# Patient Record
Sex: Male | Born: 1970
Health system: Southern US, Community
[De-identification: ages and names within clinical notes are randomized; demographics above are authoritative.]

## PROBLEM LIST (undated history)

## (undated) DIAGNOSIS — K219 Gastro-esophageal reflux disease without esophagitis: Secondary | ICD-10-CM

## (undated) DIAGNOSIS — F329 Major depressive disorder, single episode, unspecified: Secondary | ICD-10-CM

## (undated) DIAGNOSIS — F32A Depression, unspecified: Secondary | ICD-10-CM

## (undated) DIAGNOSIS — IMO0002 Reserved for concepts with insufficient information to code with codable children: Secondary | ICD-10-CM

## (undated) DIAGNOSIS — R002 Palpitations: Secondary | ICD-10-CM

## (undated) DIAGNOSIS — J45909 Unspecified asthma, uncomplicated: Secondary | ICD-10-CM

## (undated) DIAGNOSIS — E291 Testicular hypofunction: Secondary | ICD-10-CM

## (undated) DIAGNOSIS — G43909 Migraine, unspecified, not intractable, without status migrainosus: Secondary | ICD-10-CM

## (undated) DIAGNOSIS — M797 Fibromyalgia: Secondary | ICD-10-CM

## (undated) DIAGNOSIS — I2510 Atherosclerotic heart disease of native coronary artery without angina pectoris: Secondary | ICD-10-CM

## (undated) DIAGNOSIS — G4730 Sleep apnea, unspecified: Secondary | ICD-10-CM

## (undated) DIAGNOSIS — N200 Calculus of kidney: Secondary | ICD-10-CM

## (undated) DIAGNOSIS — Z8739 Personal history of other diseases of the musculoskeletal system and connective tissue: Secondary | ICD-10-CM

## (undated) DIAGNOSIS — M1990 Unspecified osteoarthritis, unspecified site: Secondary | ICD-10-CM

## (undated) DIAGNOSIS — H409 Unspecified glaucoma: Secondary | ICD-10-CM

## (undated) DIAGNOSIS — F419 Anxiety disorder, unspecified: Secondary | ICD-10-CM

## (undated) DIAGNOSIS — R000 Tachycardia, unspecified: Secondary | ICD-10-CM

## (undated) DIAGNOSIS — D869 Sarcoidosis, unspecified: Secondary | ICD-10-CM

## (undated) DIAGNOSIS — I10 Essential (primary) hypertension: Secondary | ICD-10-CM

## (undated) HISTORY — DX: Morbid (severe) obesity due to excess calories: E66.01

## (undated) HISTORY — DX: Unspecified glaucoma: H40.9

## (undated) HISTORY — DX: Palpitations: R00.2

## (undated) HISTORY — DX: Depression, unspecified: F32.A

## (undated) HISTORY — DX: Sarcoidosis, unspecified: D86.9

## (undated) HISTORY — PX: LITHOTRIPSY: SUR834

## (undated) HISTORY — DX: Essential (primary) hypertension: I10

## (undated) HISTORY — DX: Sleep apnea, unspecified: G47.30

## (undated) HISTORY — PX: SPINE SURGERY: SHX786

## (undated) HISTORY — DX: Reserved for concepts with insufficient information to code with codable children: IMO0002

## (undated) HISTORY — DX: Testicular hypofunction: E29.1

## (undated) HISTORY — DX: Anxiety disorder, unspecified: F41.9

## (undated) HISTORY — DX: Tachycardia, unspecified: R00.0

## (undated) HISTORY — DX: Unspecified asthma, uncomplicated: J45.909

## (undated) HISTORY — PX: OTHER SURGICAL HISTORY: SHX169

## (undated) HISTORY — DX: Migraine, unspecified, not intractable, without status migrainosus: G43.909

## (undated) HISTORY — DX: Atherosclerotic heart disease of native coronary artery without angina pectoris: I25.10

---
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
## 2013-05-26 HISTORY — PX: OTHER SURGICAL HISTORY: SHX169

## 2017-05-25 ENCOUNTER — Other Ambulatory Visit: Payer: Self-pay | Admitting: Internal Medicine

## 2017-05-27 ENCOUNTER — Other Ambulatory Visit: Payer: Self-pay | Admitting: Internal Medicine

## 2017-05-27 DIAGNOSIS — R1030 Lower abdominal pain, unspecified: Secondary | ICD-10-CM

## 2017-06-01 ENCOUNTER — Other Ambulatory Visit: Payer: Self-pay

## 2017-06-02 ENCOUNTER — Ambulatory Visit
Admission: RE | Admit: 2017-06-02 | Discharge: 2017-06-02 | Disposition: A | Discharge status: Home / Self Care | Source: Ambulatory Visit | Attending: Internal Medicine | Admitting: Internal Medicine

## 2017-06-02 DIAGNOSIS — R1030 Lower abdominal pain, unspecified: Secondary | ICD-10-CM

## 2018-12-17 ENCOUNTER — Other Ambulatory Visit: Payer: Self-pay

## 2018-12-21 ENCOUNTER — Ambulatory Visit: Admitting: Endocrinology

## 2019-01-17 ENCOUNTER — Ambulatory Visit: Admitting: Endocrinology

## 2019-05-04 ENCOUNTER — Telehealth: Payer: Self-pay | Admitting: Cardiology

## 2019-05-04 ENCOUNTER — Encounter: Payer: Self-pay | Admitting: Cardiology

## 2019-05-04 ENCOUNTER — Ambulatory Visit (INDEPENDENT_AMBULATORY_CARE_PROVIDER_SITE_OTHER): Admitting: Cardiology

## 2019-05-04 ENCOUNTER — Encounter (INDEPENDENT_AMBULATORY_CARE_PROVIDER_SITE_OTHER): Payer: Self-pay

## 2019-05-04 ENCOUNTER — Ambulatory Visit

## 2019-05-04 VITALS — BP 180/114 | HR 107 | Temp 97.7°F | Ht 72.0 in | Wt 256.0 lb

## 2019-05-04 DIAGNOSIS — R002 Palpitations: Secondary | ICD-10-CM

## 2019-05-04 DIAGNOSIS — I493 Ventricular premature depolarization: Secondary | ICD-10-CM | POA: Diagnosis not present

## 2019-05-04 NOTE — Progress Notes (Signed)
Clinical Summary Mr. Zheng is a 48 y.o.male seen as new patient.   1. 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 Symptoms occursing daily. Lasts a few seconds, tends to occur with exertion - +SOB, no dizziness.   - coffee rarely, rare tea. Pepsi and mountain approx 2 liters. No energy drinks. Rare energy drinks. Rare EtoH.  Takes goodies 4-5 times per day. Takes adderall bid.  - on proprnolol for several years.  - can have random diaphoretic episodes  2. Sarcoid - involvement of eye, lungs, liver, kidney, lymph nodes, and neuro - followed by rheumatology   3. HTN - home 130s/80s  - previously seen by cardiology in 2011, evaluated by [facility] is [employer] Past Medical History:  Diagnosis Date  . Anxiety   . Depression   . Migraine   . Morbid obesity ([place])   . Palpitations   . Sarcoidosis   . Tachycardia   . Testicular hypofunction      No Known Allergies   Current Outpatient Medications  Medication Sig Dispense Refill  . amphetamine-dextroamphetamine (ADDERALL) 15 MG tablet Take 15 mg by mouth 2 (two) times daily.    Marland Kitchen atorvastatin (LIPITOR) 10 MG tablet Take 10 mg by mouth daily.    . cyclobenzaprine (FLEXERIL) 10 MG tablet Take 10 mg by mouth 3 (three) times daily.    . propranolol (INDERAL) 80 MG tablet Take 80 mg by mouth daily.    . sertraline (ZOLOFT) 100 MG tablet Take 100 mg by mouth daily.    . temazepam (RESTORIL) 30 MG capsule Take 30 mg by mouth at bedtime as needed for sleep.    . Testosterone Cypionate 200 MG/ML SOLN Inject 1 mL as directed every 14 (fourteen) days.    . Venlafaxine HCl 225 MG TB24 Take 1 tablet by mouth daily.     No current facility-administered medications for this visit.         No Known Allergies    History reviewed. No pertinent family history.   Social History Social History    Socioeconomic History  . Marital status: Married    Spouse name: Not on file  . Number of children: Not on file  . Years of education: Not on file  . Highest education level: Not on file  Occupational History  . Not on file  Tobacco Use  . Smoking status: Current Every Day Smoker  . Smokeless tobacco: Current User    Types: Snuff  Substance and Sexual Activity  . Alcohol use: Yes    Comment: rare   . Drug use: Yes    Types: Marijuana  . Sexual activity: Not on file  Other Topics Concern  . Not on file  Social History Narrative  . Not on file   Social Determinants of Health   Financial Resource Strain:   . Difficulty of Paying Living Expenses: Not on file  Food Insecurity:   . Worried About Charity fundraiser in the Last Year: Not on file  . Ran Out of Food in the Last Year: Not on file  Transportation Needs:   . Lack of Transportation (Medical): Not on file  . Lack of Transportation (Non-Medical): Not on file  Physical Activity:   . Days of Exercise per Week: Not on file  . Minutes of Exercise per Session: Not on file  Stress:   .  Feeling of Stress : Not on file  Social Connections:   . Frequency of Communication with Friends and Family: Not on file  . Frequency of Social Gatherings with Friends and Family: Not on file  . Attends Religious Services: Not on file  . Active Member of Clubs or Organizations: Not on file  . Attends Archivist Meetings: Not on file  . Marital Status: Not on file  Intimate Partner Violence:   . Fear of Current or Ex-Partner: Not on file  . Emotionally Abused: Not on file  . Physically Abused: Not on file  . Sexually Abused: Not on file     Review of Systems CONSTITUTIONAL: No weight loss, fever, chills, weakness or fatigue.  HEENT: Eyes: No visual loss, blurred vision, double vision or yellow sclerae.No hearing loss, sneezing, congestion, runny nose or sore throat.  SKIN: No rash or itching.  CARDIOVASCULAR: per hpi  RESPIRATORY: No shortness of breath, cough or sputum.  GASTROINTESTINAL: No anorexia, nausea, vomiting or diarrhea. No abdominal pain or blood.  GENITOURINARY: No burning on urination, no polyuria NEUROLOGICAL: No headache, dizziness, syncope, paralysis, ataxia, numbness or tingling in the extremities. No change in bowel or bladder control.  MUSCULOSKELETAL: No muscle, back pain, joint pain or stiffness.  LYMPHATICS: No enlarged nodes. No history of splenectomy.  PSYCHIATRIC: No history of depression or anxiety.  ENDOCRINOLOGIC: No reports of sweating, cold or heat intolerance. No polyuria or polydipsia.  Marland Kitchen   Physical Examination Today's Vitals   05/04/19 0833  BP: (!) 180/114  Pulse: (!) 107  Temp: 97.7 F (36.5 C)  TempSrc: Skin  Weight: 256 lb (116.1 kg)  Height: 6' (1.829 m)   Body mass index is 34.72 kg/m.  Gen: resting comfortably, no acute distress HEENT: no scleral icterus, pupils equal round and reactive, no palptable cervical adenopathy,  CV: RRR, no m/r/g, no jvd Resp: Clear to auscultation bilaterally GI: abdomen is soft, non-tender, non-distended, normal bowel sounds, no hepatosplenomegaly MSK: extremities are warm, no edema.  Skin: warm, no rash Neuro:  no focal deficits Psych: appropriate affect     Assessment and Plan  1. PVCs - we will arrange a home monitor to measure PVCs burden and look for any nonsustained or sustained ventricular arrhythmias.   2. Sarcoid - followed by rheum - obtain prior records as far as cardiac workup.    Bp elevated in clinic today but home numbers are fine, conitnue to monitor.      Arnoldo Lenis,

## 2019-05-04 NOTE — Telephone Encounter (Signed)

## 2019-05-04 NOTE — Patient Instructions (Signed)
Medication Instructions:  Your physician recommends that you continue on your current medications as directed. Please refer to the Current Medication list given to you today.   Labwork: none  Testing/Procedures: Your physician has recommended that you wear a holter monitor. Holter monitors are medical devices that record the heart's electrical activity. Doctors most often use these monitors to diagnose arrhythmias. Arrhythmias are problems with the speed or rhythm of the heartbeat. The monitor is a small, portable device. You can wear one while you do your normal daily activities. This is usually used to diagnose what is causing palpitations/syncope (passing out).  72 hour   Follow-Up: Your physician recommends that you schedule a follow-up appointment in: 6 weeks    Any Other Special Instructions Will Be Listed Below (If Applicable).     If you need a refill on your cardiac medications before your next appointment, please call your pharmacy.

## 2019-05-16 ENCOUNTER — Telehealth: Payer: Self-pay | Admitting: Cardiology

## 2019-05-16 NOTE — Telephone Encounter (Signed)
Please call Neoma Laming w/ [facility] for short scan notification  [phone]

## 2019-05-17 NOTE — Telephone Encounter (Signed)
Called,  no answer.   Left message

## 2019-05-30 ENCOUNTER — Other Ambulatory Visit: Payer: Self-pay

## 2019-06-15 ENCOUNTER — Telehealth (INDEPENDENT_AMBULATORY_CARE_PROVIDER_SITE_OTHER): Admitting: Cardiology

## 2019-06-15 ENCOUNTER — Encounter: Payer: Self-pay | Admitting: Cardiology

## 2019-06-15 VITALS — Ht 72.0 in | Wt 242.0 lb

## 2019-06-15 DIAGNOSIS — R002 Palpitations: Secondary | ICD-10-CM

## 2019-06-15 DIAGNOSIS — I493 Ventricular premature depolarization: Secondary | ICD-10-CM | POA: Diagnosis not present

## 2019-06-15 NOTE — Progress Notes (Signed)
Virtual Visit via Telephone Note   This visit type was conducted due to national recommendations for restrictions regarding the COVID-19 Pandemic (e.g. social distancing) in an effort to limit this patient's exposure and mitigate transmission in our community.  Due to his co-morbid illnesses, this patient is at least at moderate risk for complications without adequate follow up.  This format is felt to be most appropriate for this patient at this time.  The patient did not have access to video technology/had technical difficulties with video requiring transitioning to audio format only (telephone).  All issues noted in this document were discussed and addressed.  No physical exam could be performed with this format.  Please refer to the patient's chart for his  consent to telehealth for [facility].   Date:  06/15/2019   ID:  Shaina Beaudet, DOB Sep 29, 1970, MRN [id number]  Patient Location: Home Provider Location: Home  PCP:  Celene Squibb, MD  Cardiologist:  Dr Carlyle Dolly MD Electrophysiologist:  None   Evaluation Performed:  Follow-Up Visit  Chief Complaint:  Follow up  History of Present Illness:    Henry Webb is a 49 y.o. male seen today for follow up of the following medical problems .  1. 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 Symptoms occursing daily. Lasts a few seconds, tends to occur with exertion - +SOB, no dizziness.   - coffee rarely, rare tea. Pepsi and mountain approx 2 liters. Rare energy drinks. Rare EtoH.  Takes goodies 4-5 times per day. Takes adderall bid.  - on proprnolol for several years.  - can have random diaphoretic episodes    04/2019 episodes 1% PVC burden. 02/2019 [facility] PET scan: no evidence of cardiac sarcoid - symptoms improving. Exercising regularly without troubles. Has cut back on caffeine. Quit taking adderall.     2. Sarcoid - involvement  of eye, lungs, liver, kidney, lymph nodes, and neuro - followed by rheumatology   3. HTN - home 130s/80s  - previously seen by cardiology in 2011, evaluated by [facility]   The patient does not have symptoms concerning for COVID-19 infection (fever, chills, cough, or new shortness of breath).    Past Medical History:  Diagnosis Date  . Anxiety   . Depression   . Migraine   . Morbid obesity ([place])   . Palpitations   . Sarcoidosis   . Tachycardia   . Testicular hypofunction    No past surgical history on file.   No outpatient medications have been marked as taking for the 06/15/19 encounter (Appointment) with Arnoldo Lenis, MD.     Allergies:   Patient has no known allergies.   Social History   Tobacco Use  . Smoking status: Current Every Day Smoker  . Smokeless tobacco: Current User    Types: Snuff  Substance Use Topics  . Alcohol use: Yes    Comment: rare   . Drug use: Yes    Types: Marijuana     Family Hx: The patient's family history is not on file.  ROS:   Please see the history of present illness.     All other systems reviewed and are negative.   Prior CV studies:   The following studies were reviewed today:   04/2019 event monitor  72 hour monitor  Min HR 76, Max HR 147, Avg HR 105  Rare supraventricular ectopy in the form of isolated  PACs  Occasional ventricular ectopy. 1% PVC burden. In the form of isolated PVCs, couplets, bigeminy, trigeminy. No NSVT or VT.   02/2019 Cardiac PET [facility] Impressions: - No evidence of cardiac sarcoidosis - Perfusion: Normal perfusion in the left ventricular myocardium on resting images. Metabolism: No abnormal FDG uptake noted.  - Global systolic function is mildly reduced. - Coronary calcifications are noted  Labs/Other Tests and Data Reviewed:    EKG:  No ECG reviewed.  Recent Labs: No results found for requested labs within last 8760 hours.   Recent Lipid Panel No  results found for: CHOL, TRIG, HDL, CHOLHDL, LDLCALC, LDLDIRECT  Wt Readings from Last 3 Encounters:  05/04/19 256 lb (116.1 kg)     Objective:    Vital Signs:   Today's Vitals   06/15/19 0816  Weight: 242 lb (109.8 kg)  Height: 6' (1.829 m)   Body mass index is 32.82 kg/m. Norma affect. Normal speech pattern and tone. COmfortable, no apparent distress. No audible signs of SOB or wheezing .  ASSESSMENT & PLAN:    1. PVCs - low burden on recent monitor, we are calling [facility] as the prelim report generaited mentions some NSVT but no included strips show this - symptoms improved with caffeine weaning - continue propanolol that he has been on, if recurrent symptoms can titrate or change to lopressor  2. Sarcoid - followed by rheum - recent cardiac PET at [facility] showed no cardiac involvement.      COVID-19 Education: The signs and symptoms of COVID-19 were discussed with the patient and how to seek care for testing (follow up with PCP or arrange E-visit).  The importance of social distancing was discussed today.  Time:   Today, I have spent 12 minutes with the patient with telehealth technology discussing the above problems.     Medication Adjustments/Labs and Tests Ordered: Current medicines are reviewed at length with the patient today.  Concerns regarding medicines are outlined above.   Tests Ordered: No orders of the defined types were placed in this encounter.   Medication Changes: No orders of the defined types were placed in this encounter.   Follow Up:  Either In Person or Virtual in 6 month(s)  Signed, Carlyle Dolly, MD  06/15/2019 7:54 AM    [place]

## 2019-06-15 NOTE — Patient Instructions (Signed)

## 2019-12-26 ENCOUNTER — Ambulatory Visit: Admitting: Cardiology

## 2019-12-26 NOTE — Progress Notes (Deleted)
Clinical Summary Henry Webb is a 49 y.o.male seen today for follow up of the following medical problems .  1. 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 Symptoms occursing daily. Lasts a few seconds, tends to occur with exertion - +SOB, no dizziness.   - coffee rarely, rare tea. Pepsi and mountain approx 2 liters. Rare energy drinks. Rare EtoH.  Takes goodies 4-5 times per day. Takes adderall bid.  - on proprnolol for several years.  - can have random diaphoretic episodes    04/2019 episodes 1% PVC burden. 02/2019 [facility] PET scan: no evidence of cardiac sarcoid - symptoms improving. Exercising regularly without troubles. Has cut back on caffeine. Quit taking adderall.     2. Sarcoid - involvement of eye, lungs, liver, kidney, lymph nodes, and neuro - followed by rheumatology   3. HTN - home 130s/80s  - previously seen by cardiology in 2011, evaluated by [facility]     Past Medical History:  Diagnosis Date  . Anxiety   . Depression   . Migraine   . Morbid obesity ([place])   . Palpitations   . Sarcoidosis   . Tachycardia   . Testicular hypofunction      No Known Allergies   Current Outpatient Medications  Medication Sig Dispense Refill  . atorvastatin (LIPITOR) 40 MG tablet TK 1 T PO NIGHTLY FOR CHOLESTEROL    . cyclobenzaprine (FLEXERIL) 10 MG tablet Take 10 mg by mouth 3 (three) times daily.    . propranolol (INDERAL) 80 MG tablet Take 80 mg by mouth daily.    . sertraline (ZOLOFT) 100 MG tablet Take 100 mg by mouth daily.    . temazepam (RESTORIL) 30 MG capsule Take 30 mg by mouth at bedtime as needed for sleep.    . Testosterone Cypionate 200 MG/ML SOLN Inject 1 mL as directed every 14 (fourteen) days.    . Venlafaxine HCl 225 MG TB24 Take 1 tablet by mouth daily.     No current facility-administered medications for this visit.     No  past surgical history on file.   No Known Allergies    No family history on file.   Social History Henry Webb reports that he has quit smoking. His smokeless tobacco use includes snuff. Henry Webb reports current alcohol use.   Review of Systems CONSTITUTIONAL: No weight loss, fever, chills, weakness or fatigue.  HEENT: Eyes: No visual loss, blurred vision, double vision or yellow sclerae.No hearing loss, sneezing, congestion, runny nose or sore throat.  SKIN: No rash or itching.  CARDIOVASCULAR:  RESPIRATORY: No shortness of breath, cough or sputum.  GASTROINTESTINAL: No anorexia, nausea, vomiting or diarrhea. No abdominal pain or blood.  GENITOURINARY: No burning on urination, no polyuria NEUROLOGICAL: No headache, dizziness, syncope, paralysis, ataxia, numbness or tingling in the extremities. No change in bowel or bladder control.  MUSCULOSKELETAL: No muscle, back pain, joint pain or stiffness.  LYMPHATICS: No enlarged nodes. No history of splenectomy.  PSYCHIATRIC: No history of depression or anxiety.  ENDOCRINOLOGIC: No reports of sweating, cold or heat intolerance. No polyuria or polydipsia.  Marland Kitchen   Physical Examination There were no vitals filed for this visit. There were no vitals filed for this visit.  Gen: resting comfortably, no acute distress HEENT: no scleral icterus, pupils equal round and reactive, no palptable cervical adenopathy,  CV Resp: Clear to auscultation bilaterally GI: abdomen is  soft, non-tender, non-distended, normal bowel sounds, no hepatosplenomegaly MSK: extremities are warm, no edema.  Skin: warm, no rash Neuro:  no focal deficits Psych: appropriate affect   Diagnostic Studies   04/2019 event monitor  72 hour monitor  Min HR 76, Max HR 147, Avg HR 105  Rare supraventricular ectopy in the form of isolated PACs  Occasional ventricular ectopy. 1% PVC burden. In the form of isolated PVCs, couplets, bigeminy, trigeminy. No NSVT or  VT.   02/2019 Cardiac PET [facility] Impressions: - No evidence of cardiac sarcoidosis - Perfusion: Normal perfusion in the left ventricular myocardium on resting images. Metabolism: No abnormal FDG uptake noted.  - Global systolic function is mildly reduced. - Coronary calcifications are noted   Assessment and Plan  1. PVCs - low burden on recent monitor, we are calling [facility] as the prelim report generaited mentions some NSVT but no included strips show this - symptoms improved with caffeine weaning - continue propanolol that he has been on, if recurrent symptoms can titrate or change to lopressor  2. Sarcoid - followed by rheum - recent cardiac PET at [facility] showed no cardiac involvement.       Arnoldo Lenis, M.D., F.A.C.C.

## 2020-05-11 ENCOUNTER — Encounter: Payer: Self-pay | Admitting: Cardiology

## 2020-05-11 ENCOUNTER — Other Ambulatory Visit: Payer: Self-pay

## 2020-05-11 ENCOUNTER — Ambulatory Visit: Admitting: Cardiology

## 2020-05-11 VITALS — BP 152/100 | HR 121 | Ht 72.0 in | Wt 252.0 lb

## 2020-05-11 DIAGNOSIS — I493 Ventricular premature depolarization: Secondary | ICD-10-CM

## 2020-05-11 DIAGNOSIS — R002 Palpitations: Secondary | ICD-10-CM | POA: Diagnosis not present

## 2020-05-11 DIAGNOSIS — R0789 Other chest pain: Secondary | ICD-10-CM | POA: Diagnosis not present

## 2020-05-11 DIAGNOSIS — R079 Chest pain, unspecified: Secondary | ICD-10-CM | POA: Diagnosis not present

## 2020-05-11 NOTE — Progress Notes (Signed)
Clinical Summary Henry Webb is a 49 y.o.male seen today for follow up of the following medical problems .  1. Palpitations - symptoms on and off for several years - 04/2019 episodes 1% PVC burden. 02/2019 [facility] PET scan: no evidence of cardiac sarcoid  - palpitations at times though overall stable - of not reports high bc powder intake.    2. Chest pain - sharp pain midchest, come activity. Mild pain, resolves with rest. No other associated symptoms - symptoms started 3-4 months ago. No change in pattern.  CAD: HL, former 8 years tobacco, father CAD in 17s, paternal grandather heart diease later 61s,   - 02/2019 PET stress at [facility], some coronary calcifications, no ischemia. Of note was not having chest pains at that time, test ordered to evaluate for cardiac sarcoid which was negative.   3. Sarcoid - involvement of eye, lungs, liver, kidney, lymph nodes, and neuro - followed by rheumatology   4. White coat HTN - home 130s/80s   5. Chronic headache - heavy bc powder use, we discussed the concerns of high ASA and caffeine intake associated with bc powder  Has 2 grandkids, one on the way.   Past Medical History:  Diagnosis Date  . Anxiety   . Depression   . Migraine   . Morbid obesity ([place])   . Palpitations   . Sarcoidosis   . Tachycardia   . Testicular hypofunction      No Known Allergies   Current Outpatient Medications  Medication Sig Dispense Refill  . atorvastatin (LIPITOR) 40 MG tablet TK 1 T PO NIGHTLY FOR CHOLESTEROL    . cyclobenzaprine (FLEXERIL) 10 MG tablet Take 10 mg by mouth 3 (three) times daily.    . propranolol (INDERAL) 80 MG tablet Take 80 mg by mouth daily.    . sertraline (ZOLOFT) 100 MG tablet Take 100 mg by mouth daily.    . temazepam (RESTORIL) 30 MG capsule Take 30 mg by mouth at bedtime as needed for sleep.    . Testosterone Cypionate 200 MG/ML SOLN Inject 1 mL as directed every 14 (fourteen) days.    . Venlafaxine HCl 225 MG  TB24 Take 1 tablet by mouth daily.     No current facility-administered medications for this visit.     No past surgical history on file.   No Known Allergies    No family history on file.   Social History Henry Webb reports that he has quit smoking. His smokeless tobacco use includes snuff. Henry Webb reports current alcohol use.   Review of Systems CONSTITUTIONAL: No weight loss, fever, chills, weakness or fatigue.  HEENT: Eyes: No visual loss, blurred vision, double vision or yellow sclerae.No hearing loss, sneezing, congestion, runny nose or sore throat.  SKIN: No rash or itching.  CARDIOVASCULAR: per hpi RESPIRATORY: No shortness of breath, cough or sputum.  GASTROINTESTINAL: No anorexia, nausea, vomiting or diarrhea. No abdominal pain or blood.  GENITOURINARY: No burning on urination, no polyuria NEUROLOGICAL: No headache, dizziness, syncope, paralysis, ataxia, numbness or tingling in the extremities. No change in bowel or bladder control.  MUSCULOSKELETAL: No muscle, back pain, joint pain or stiffness.  LYMPHATICS: No enlarged nodes. No history of splenectomy.  PSYCHIATRIC: No history of depression or anxiety.  ENDOCRINOLOGIC: No reports of sweating, cold or heat intolerance. No polyuria or polydipsia.  Marland Kitchen   Physical Examination Today's Vitals   05/11/20 1346  BP: (!) 152/100  Pulse: (!) 121  SpO2: 96%  Weight:  252 lb (114.3 kg)  Height: 6' (1.829 m)   Body mass index is 34.18 kg/m.  Gen: resting comfortably, no acute distress HEENT: no scleral icterus, pupils equal round and reactive, no palptable cervical adenopathy,  CV: RRR, no m/r/g, no jvd Resp: Clear to auscultation bilaterally GI: abdomen is soft, non-tender, non-distended, normal bowel sounds, no hepatosplenomegaly MSK: extremities are warm, no edema.  Skin: warm, no rash Neuro:  no focal deficits Psych: appropriate affect   Diagnostic Studies 04/2019 event monitor  72 hour monitor  Min HR  76, Max HR 147, Avg HR 105  Rare supraventricular ectopy in the form of isolated PACs  Occasional ventricular ectopy. 1% PVC burden. In the form of isolated PVCs, couplets, bigeminy, trigeminy. No NSVT or VT.   02/2019 Cardiac PET [facility] Impressions: - No evidence of cardiac sarcoidosis - Perfusion: Normal perfusion in the left ventricular myocardium on resting images. Metabolism: No abnormal FDG uptake noted.  - Global systolic function is mildly reduced. - Coronary calcifications are noted    Assessment and Plan  1. PVCs - low burden on recent monitor, -symptoms overlal controlled on propranolol which he has been on for some time -discussed weaning caffeine intake with bc powders  2. Chest pain - exertional chest pains - had negative stress PET last year at [facility], however this was done to look for sarcoid and he was not symptomatic at the time - with new exertional chest pain symptoms will plan for GXT - EKG today shows sinus tach, no acute ischemic changes    Arnoldo Lenis, M.D.

## 2020-05-11 NOTE — Patient Instructions (Addendum)
Medication Instructions:  No Changes today *If you need a refill on your cardiac medications before your next appointment, please call your pharmacy*   Lab Work: None Today If you have labs (blood work) drawn today and your tests are completely normal, you will receive your results only by: Marland Kitchen MyChart Message (if you have MyChart) OR . A paper copy in the mail If you have any lab test that is abnormal or we need to change your treatment, we will call you to review the results.   Testing/Procedures: GXT    Follow-Up: At [facility], you and your health needs are our priority.  As part of our continuing mission to provide you with exceptional heart care, we have created designated Provider Care Teams.  These Care Teams include your primary Cardiologist (physician) and Advanced Practice Providers (APPs -  Physician Assistants and Nurse Practitioners) who all work together to provide you with the care you need, when you need it.  We recommend signing up for the patient portal called "MyChart".  Sign up information is provided on this After Visit Summary.  MyChart is used to connect with patients for Virtual Visits (Telemedicine).  Patients are able to view lab/test results, encounter notes, upcoming appointments, etc.  Non-urgent messages can be sent to your provider as well.   To learn more about what you can do with MyChart, go to [email or web address].    Your next appointment:   6 month(s)  The format for your next appointment:   In Person  Provider:   Carlyle Dolly, MD   Other Instructions The day of your Exercise Stress Electrocardiography, please do NOT take Propanolol 80 mg tablet.

## 2020-05-14 ENCOUNTER — Other Ambulatory Visit (HOSPITAL_COMMUNITY)
Admission: RE | Admit: 2020-05-14 | Discharge: 2020-05-14 | Disposition: A | Discharge status: Home / Self Care | Source: Ambulatory Visit | Attending: Cardiology | Admitting: Cardiology

## 2020-05-14 ENCOUNTER — Other Ambulatory Visit: Payer: Self-pay

## 2020-05-14 DIAGNOSIS — Z20822 Contact with and (suspected) exposure to covid-19: Secondary | ICD-10-CM | POA: Diagnosis not present

## 2020-05-14 DIAGNOSIS — Z01812 Encounter for preprocedural laboratory examination: Secondary | ICD-10-CM | POA: Insufficient documentation

## 2020-05-15 LAB — SARS CORONAVIRUS 2 (TAT 6-24 HRS): SARS Coronavirus 2: NEGATIVE

## 2020-05-16 ENCOUNTER — Ambulatory Visit (HOSPITAL_COMMUNITY)
Admission: RE | Admit: 2020-05-16 | Discharge: 2020-05-16 | Disposition: A | Discharge status: Home / Self Care | Source: Ambulatory Visit | Attending: Cardiology | Admitting: Cardiology

## 2020-05-16 ENCOUNTER — Other Ambulatory Visit: Payer: Self-pay

## 2020-05-16 DIAGNOSIS — R079 Chest pain, unspecified: Secondary | ICD-10-CM | POA: Diagnosis not present

## 2020-05-16 LAB — EXERCISE TOLERANCE TEST
Estimated workload: 9.1 METS
Exercise duration (min): 6 min
Exercise duration (sec): 42 s
MPHR: 171 {beats}/min
Peak HR: 150 {beats}/min
Percent HR: 87 %
RPE: 17
Rest HR: 100 {beats}/min

## 2020-05-31 ENCOUNTER — Encounter: Payer: Self-pay | Admitting: Internal Medicine

## 2020-06-01 ENCOUNTER — Telehealth: Payer: Self-pay

## 2020-06-01 DIAGNOSIS — R079 Chest pain, unspecified: Secondary | ICD-10-CM

## 2020-06-01 NOTE — Telephone Encounter (Signed)
-----   Message from Arnoldo Lenis, MD sent at 05/21/2020 10:34 AM EST ----- Mixed findings on recent stress test, needs more detailed testing for further evaluation. Can we order a lexiscan for chest pain  Zandra Abts MD

## 2020-06-01 NOTE — Telephone Encounter (Signed)
I spoke with patient, he agrees to have [place].I gave him instructions for test.

## 2020-06-06 ENCOUNTER — Other Ambulatory Visit: Payer: Self-pay

## 2020-06-06 ENCOUNTER — Ambulatory Visit (HOSPITAL_COMMUNITY)
Admission: RE | Admit: 2020-06-06 | Discharge: 2020-06-06 | Disposition: A | Discharge status: Home / Self Care | Source: Ambulatory Visit | Attending: Cardiology | Admitting: Cardiology

## 2020-06-06 ENCOUNTER — Encounter (HOSPITAL_COMMUNITY)
Admission: RE | Admit: 2020-06-06 | Discharge: 2020-06-06 | Disposition: A | Discharge status: Home / Self Care | Source: Ambulatory Visit | Attending: Cardiology | Admitting: Cardiology

## 2020-06-06 DIAGNOSIS — R079 Chest pain, unspecified: Secondary | ICD-10-CM | POA: Insufficient documentation

## 2020-06-06 LAB — NM MYOCAR MULTI W/SPECT W/WALL MOTION / EF
LV dias vol: 121 mL (ref 62–150)
LV sys vol: 53 mL
Peak HR: 121 {beats}/min
RATE: 0.47
Rest HR: 78 {beats}/min
SDS: 4
SRS: 0
SSS: 4
TID: 1.26

## 2020-06-06 MED ORDER — REGADENOSON 0.4 MG/5ML IV SOLN
INTRAVENOUS | Status: AC
  Administered 2020-06-06: 0.4 mg via INTRAVENOUS
  Filled 2020-06-06: qty 5

## 2020-06-06 MED ORDER — TECHNETIUM TC 99M TETROFOSMIN IV KIT
30.0000 | PACK | Freq: Once | INTRAVENOUS | Status: AC | PRN
  Administered 2020-06-06: 32.6 via INTRAVENOUS

## 2020-06-06 MED ORDER — TECHNETIUM TC 99M TETROFOSMIN IV KIT
10.0000 | PACK | Freq: Once | INTRAVENOUS | Status: AC | PRN
  Administered 2020-06-06: 10.9 via INTRAVENOUS

## 2020-06-06 MED ORDER — SODIUM CHLORIDE FLUSH 0.9 % IV SOLN
INTRAVENOUS | Status: AC
  Administered 2020-06-06: 10 mL via INTRAVENOUS
  Filled 2020-06-06: qty 10

## 2020-06-12 ENCOUNTER — Telehealth: Payer: Self-pay | Admitting: *Deleted

## 2020-06-12 NOTE — Telephone Encounter (Signed)
Pt voiced understanding and c/o chest pain when he exerts him self and says its not unbearable but noticeable, he denies any SOB - pcp called him with lab results and total cholesterol is 262 and pcp restarted atorvastatin 40 mg daily

## 2020-06-12 NOTE — Telephone Encounter (Signed)
-----   Message from Arnoldo Lenis, MD sent at 06/11/2020 12:50 PM EST ----- Stress test dose suggest there may be a moderate area of blocakge on the bottom of the heart. Depending on his symptoms would dictate what we would need to do next. Can he update [place] on any ongoing chest pain or SOB symptoms   J BrancH MD

## 2020-06-13 MED ORDER — ISOSORBIDE MONONITRATE ER 30 MG PO TB24: 30.0000 mg | ORAL_TABLET | Freq: Every day | ORAL | 3 refills | Status: DC

## 2020-06-13 NOTE — Telephone Encounter (Signed)
Patient informed and verbalized understanding of plan. 

## 2020-06-13 NOTE — Telephone Encounter (Signed)
Can we start him on imdur 30mg  daily and have him see PA in 2-3 weeks  Zandra Abts MD

## 2020-06-27 ENCOUNTER — Encounter: Payer: Self-pay | Admitting: Physician Assistant

## 2020-06-27 ENCOUNTER — Other Ambulatory Visit: Payer: Self-pay

## 2020-06-27 ENCOUNTER — Ambulatory Visit (INDEPENDENT_AMBULATORY_CARE_PROVIDER_SITE_OTHER): Admitting: Physician Assistant

## 2020-06-27 VITALS — BP 144/100 | HR 116 | Ht 72.0 in | Wt 242.6 lb

## 2020-06-27 DIAGNOSIS — I25119 Atherosclerotic heart disease of native coronary artery with unspecified angina pectoris: Secondary | ICD-10-CM

## 2020-06-27 DIAGNOSIS — E782 Mixed hyperlipidemia: Secondary | ICD-10-CM

## 2020-06-27 DIAGNOSIS — I10 Essential (primary) hypertension: Secondary | ICD-10-CM

## 2020-06-27 DIAGNOSIS — I493 Ventricular premature depolarization: Secondary | ICD-10-CM | POA: Diagnosis not present

## 2020-06-27 MED ORDER — ASPIRIN EC 81 MG PO TBEC: 81.0000 mg | DELAYED_RELEASE_TABLET | Freq: Every day | ORAL | 3 refills | Status: AC

## 2020-06-27 MED ORDER — NITROGLYCERIN 0.4 MG SL SUBL: 0.4000 mg | SUBLINGUAL_TABLET | SUBLINGUAL | 3 refills | Status: DC | PRN

## 2020-06-27 MED ORDER — PROPRANOLOL HCL ER 160 MG PO CP24: 160.0000 mg | ORAL_CAPSULE | Freq: Every day | ORAL | 5 refills | Status: DC

## 2020-06-27 NOTE — Progress Notes (Signed)
Cardiology Office Note:    Date:  06/27/2020   ID:  [name], DOB [birth year], MRN [id number]  PCP:  Celene Squibb, MD  [facility] Cardiologist:  Carlyle Dolly, MD   [facility] Electrophysiologist:  None   Referring MD: Celene Squibb, MD   Chief Complaint:  Follow-up (CAD with angina)    Patient Profile:    Henry Webb is a 49 y.o. male with:   Palpitations  04/2019 monitor - 1% PVCs  Rx: Propranolol  Coronary artery disease   Stress PET at [facility] 02/2019: Chronic calcifications, no ischemia  Myoview 1/22: inf-lat infarct w peri-infarct ischemia, normal EF; intermediate risk  Sarcoidosis  PET at [facility] 02/2019: no cardiac sarcoid  Hypertension ("whitecoat")  Migraine headaches  Morbid obesity  Prior CV studies: Myoview 06/06/2020   There was no ST segment deviation noted during stress.  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  This is an intermediate risk study.  The left ventricular ejection fraction is normal (55-65%).  GXT 05/16/2020  No diagnostic ST segment changes to indicate ischemia. Patient achieved 85% MPHR. Hypertensive response to exercise. Intermittent chest pain reported. No significant arrhythmias. Technically the Duke treadmill score is 2.5 and intermediate risk due to presence of non-limiting chest pain, although there were no diagnostic ischemic ST changes.  Blood pressure demonstrated a hypertensive response to exercise.  Event monitor 05/30/2019  72 hour monitor  Min HR 76, Max HR 147, Avg HR 105  Rare supraventricular ectopy in the form of isolated PACs  Occasional ventricular ectopy. 1% PVC burden. In the form of isolated PVCs, couplets, bigeminy, trigeminy. No NSVT or VT.  INCOMPLETE  Cardiac PET 02/2019 [facility]) - No evidence of cardiac sarcoidosis - Perfusion: Normal perfusion in the left ventricular myocardium on resting images. Metabolism: No  abnormal FDG uptake noted.  - Global systolic function is mildly reduced. - Coronary calcifications are noted  History of Present Illness:    Henry Webb was last seen by Dr. Harl Bowie in 12/21.  He had exertional chest pain.  Exercise tolerance test was intermediate risk and he was set up for a Myoview.  This suggested prior inferolateral infarct with peri-infarct ischemia.  He has been placed on long-acting nitrates for medical therapy and returns today for follow-up.  Since last seen, he has had chest discomfort only on one occasion when he was lifting weights.  He has not really tried to push it that much.  He does get short of breath with this.  He tries not to sleep flat due to his sarcoid which contributes to significant headaches.  He has not had PND or leg edema or syncope.      Past Medical History:  Diagnosis Date  . Anxiety   . Depression   . Migraine   . Morbid obesity ([place])   . Palpitations   . Sarcoidosis   . Tachycardia   . Testicular hypofunction     Current Medications: Current Meds  Medication Sig  . aspirin EC 81 MG tablet Take 1 tablet (81 mg total) by mouth daily. Swallow whole.  Marland Kitchen atorvastatin (LIPITOR) 40 MG tablet TK 1 T PO NIGHTLY FOR CHOLESTEROL  . cyclobenzaprine (FLEXERIL) 10 MG tablet Take 10 mg by mouth 3 (three) times daily.  . isosorbide mononitrate (IMDUR) 30 MG 24 hr tablet Take 1 tablet (30 mg total) by mouth daily.  . nitroGLYCERIN (NITROSTAT) 0.4 MG SL tablet Place 1 tablet (0.4 mg total) under the tongue  every 5 (five) minutes as needed for chest pain.  Marland Kitchen propranolol ER (INDERAL LA) 160 MG SR capsule Take 1 capsule (160 mg total) by mouth daily.  . sertraline (ZOLOFT) 100 MG tablet Take 100 mg by mouth daily.  . temazepam (RESTORIL) 30 MG capsule Take 30 mg by mouth at bedtime as needed for sleep.  . Testosterone Cypionate 200 MG/ML SOLN Inject 1 mL as directed every 14 (fourteen) days.  . Venlafaxine HCl 225 MG TB24 Take 1 tablet by mouth daily.  .  [DISCONTINUED] propranolol (INDERAL) 80 MG tablet Take 80 mg by mouth daily.     Allergies:   Patient has no known allergies.   Social History   Tobacco Use  . Smoking status: Former Research scientist (life sciences)  . Smokeless tobacco: Current User    Types: Snuff  Substance Use Topics  . Alcohol use: Yes    Comment: rare   . Drug use: Yes    Types: Marijuana     Family Hx: The patient's family history is not on file.  ROS   EKGs/Labs/Other Test Reviewed:    EKG:  EKG is not ordered today.  The ekg ordered today demonstrates n/a  Recent Labs: No results found for requested labs within last 8760 hours.   Recent Lipid Panel No results found for: CHOL, TRIG, HDL, CHOLHDL, LDLCALC, LDLDIRECT    Risk Assessment/Calculations:      Physical Exam:    VS:  BP (!) 144/100   Pulse (!) 116   Ht 6' (1.829 m)   Wt 242 lb 9.6 oz (110 kg)   SpO2 96%   BMI 32.90 kg/m     Wt Readings from Last 3 Encounters:  06/27/20 242 lb 9.6 oz (110 kg)  05/11/20 252 lb (114.3 kg)  06/15/19 242 lb (109.8 kg)     Constitutional:      Appearance: Healthy appearance. Not in distress.  Neck:     Vascular: No JVR. JVD normal.  Pulmonary:     Effort: Pulmonary effort is normal.     Breath sounds: No wheezing. No rales.  Cardiovascular:     Tachycardia present. Regular rhythm. Normal S1. Normal S2.     Murmurs: There is no murmur.  Edema:    Peripheral edema absent.  Abdominal:     General: There is no distension.     Palpations: Abdomen is soft.  Skin:    General: Skin is warm and dry.  Neurological:     General: No focal deficit present.     Mental Status: Alert and oriented to person, place and time.     Cranial Nerves: Cranial nerves are intact.       ASSESSMENT & PLAN:    1. Coronary artery disease involving native coronary artery of native heart with angina pectoris ([place]) Recent Myoview with evidence of inferolateral scar with peri-infarct ischemia and normal EF.  He has been placed on  nitrates in addition to beta-blocker to attempt medical therapy.  He has been taking it easy since he was last seen.  The only time he has had chest discomfort was with lifting weights.  CCS class II.  His blood pressure and heart rate are uncontrolled.  I have recommended increasing his propranolol to 160 mg daily.  Continue current dose of isosorbide.  He has been having headaches with this.  If these continue, we can try switching him to amlodipine.  I have asked him to start aspirin 81 mg daily continue atorvastatin 40 mg daily.  He should increase his activity between now and his next office visit.  If his symptoms do not improve or progress, he will likely need cardiac catheterization.  He knows to contact us if his symptoms worsen at any point between now and his next visit.  2. PVC (premature ventricular contraction) Continue beta-blocker therapy.  3. Mixed hyperlipidemia Continue high intensity statin therapy  4. Essential hypertension Blood pressure uncontrolled.  He usually has high blood pressure in the office.  However, his blood pressures at home are also elevated.  Increase propranolol as noted.         Dispo:  Return in about 4 weeks (around 07/25/2020) for Routine Follow Up with Dr. Harl Bowie.   Medication Adjustments/Labs and Tests Ordered: Current medicines are reviewed at length with the patient today.  Concerns regarding medicines are outlined above.  Tests Ordered: No orders of the defined types were placed in this encounter.  Medication Changes: Meds ordered this encounter  Medications  . propranolol ER (INDERAL LA) 160 MG SR capsule    Sig: Take 1 capsule (160 mg total) by mouth daily.    Dispense:  30 capsule    Refill:  5  . nitroGLYCERIN (NITROSTAT) 0.4 MG SL tablet    Sig: Place 1 tablet (0.4 mg total) under the tongue every 5 (five) minutes as needed for chest pain.    Dispense:  25 tablet    Refill:  3  . aspirin EC 81 MG tablet    Sig: Take 1 tablet (81 mg  total) by mouth daily. Swallow whole.    Dispense:  90 tablet    Refill:  3    Signed, Richardson Dopp, PA-C  06/27/2020 4:54 PM    [facility] [place], [place], [place]  [address] Phone: [phone]; Fax: [phone]

## 2020-06-27 NOTE — Patient Instructions (Signed)
Medication Instructions:  Your physician has recommended you make the following change in your medication:  -- STOP Taking Goody Powders -- START Aspirin 81 mg - Take 1 tablet by mouth daily - can be OTC -- INCREASE Propranolol to 160 mg - Take 1 tablet by mouth daily -- You may take Nitroglycerin as needed for chest pain -- You make take Tylenol (500 mg) 1-2 tablets as needed for headaches *If you need a refill on your cardiac medications before your next appointment, please call your pharmacy*  Follow-Up: At [facility], you and your health needs are our priority.  As part of our continuing mission to provide you with exceptional heart care, we have created designated Provider Care Teams.  These Care Teams include your primary Cardiologist (physician) and Advanced Practice Providers (APPs -  Physician Assistants and Nurse Practitioners) who all work together to provide you with the care you need, when you need it.  We recommend signing up for the patient portal called "MyChart".  Sign up information is provided on this After Visit Summary.  MyChart is used to connect with patients for Virtual Visits (Telemedicine).  Patients are able to view lab/test results, encounter notes, upcoming appointments, etc.  Non-urgent messages can be sent to your provider as well.   To learn more about what you can do with MyChart, go to [email or web address].    Your next appointment:   Your physician recommends that you schedule a follow-up appointment in: 3-4 WEEKS with Dr. Harl Bowie or an APP.   The format for your next appointment:   In Person. You may see Dr Harl Bowie or one of the following Advanced Practice Providers on your designated Care Team:    [place], PA-C   Ermalinda Barrios, [place]

## 2020-07-02 ENCOUNTER — Ambulatory Visit: Admitting: Gastroenterology

## 2020-07-24 NOTE — H&P (View-Only) (Signed)
Cardiology Office Note:    Date:  07/25/2020   ID:  Henry Webb, DOB 12-15-1970, MRN [id number]  PCP:  Celene Squibb, MD   [facility]  Cardiologist:  Carlyle Dolly, MD   Advanced Practice Provider:  None  Electrophysiologist:  None       Referring MD: Celene Squibb, MD   Chief Complaint:  Follow-up (CAD )    Patient Profile:    Henry Webb is a 50 y.o. male with:   Palpitations ? 04/2019 monitor - 1% PVCs ? Rx: Propranolol  Coronary artery disease  ? Stress PET at [facility] 02/2019: Chronic calcifications, no ischemia ? Myoview 1/22: inf-lat infarct w peri-infarct ischemia, normal EF; intermediate risk  Sarcoidosis ? PET at [facility] 02/2019: no cardiac sarcoid  Hypertension ("whitecoat")  Migraine headaches  Morbid obesity   Prior CV studies:  Myoview 06/06/2020 Inf-lat MI with mod peri-infarct ischemia, EF 55-65, intermediate risk  GXT 05/16/2020 No ST segments to suggest ischemia, + CP, Duke TM score 2.5; intermediate risk   Event monitor 05/30/2019  Min HR 76, Max HR 147, Avg HR 105  Rare supraventricular ectopy in the form of isolated PACs  Occasional ventricular ectopy. 1% PVC burden. In the form of isolated PVCs, couplets, bigeminy, trigeminy. No NSVT or VT.  Cardiac PET 02/2019 [facility]) - No evidence of cardiac sarcoidosis - Perfusion: Normal perfusion in the left ventricular myocardium on resting images. Metabolism: No abnormal FDG uptake noted.  - Global systolic function is mildly reduced. - Coronary calcifications are noted  History of Present Illness:    Henry Webb was last seen 06/27/20.  He is being managed medically for presumed CAD with angina.  Prior Myoview study was intermediate risk with inferolateral infarct with mod peri-infarct ischemia.  He noted improved symptoms.  I adjusted his beta-blocker further due to uncontrolled BP and elevated HR.  He returns for f/u.  He is here alone.  He continues to have exertional  chest symptoms.  He mainly notes substernal chest tightness/heaviness if he walks a distance carrying something heavy.  He also has left arm discomfort as well as interscapular back pain.  He has associated shortness of breath.  He has not had syncope but does get lightheaded at times.  He has not had orthopnea, leg edema.  Past Medical History:  Diagnosis Date   Anxiety    Depression    Migraine    Morbid obesity ([facility])    Palpitations    Sarcoidosis    Tachycardia    Testicular hypofunction     Current Medications: Current Meds  Medication Sig   aspirin EC 81 MG tablet Take 1 tablet (81 mg total) by mouth daily. Swallow whole.   atorvastatin (LIPITOR) 40 MG tablet TK 1 T PO NIGHTLY FOR CHOLESTEROL   cyclobenzaprine (FLEXERIL) 10 MG tablet Take 10 mg by mouth 3 (three) times daily.   isosorbide mononitrate (IMDUR) 30 MG 24 hr tablet Take 1 tablet (30 mg total) by mouth daily.   nitroGLYCERIN (NITROSTAT) 0.4 MG SL tablet Place 1 tablet (0.4 mg total) under the tongue every 5 (five) minutes as needed for chest pain.   propranolol ER (INDERAL LA) 160 MG SR capsule Take 1 capsule (160 mg total) by mouth daily.   sertraline (ZOLOFT) 100 MG tablet Take 100 mg by mouth daily.   temazepam (RESTORIL) 30 MG capsule Take 30 mg by mouth at bedtime as needed for sleep.   Testosterone Cypionate 200 MG/ML SOLN Inject  1 mL as directed every 14 (fourteen) days.   Venlafaxine HCl 225 MG TB24 Take 1 tablet by mouth daily.     Allergies:   Patient has no known allergies.   Social History   Tobacco Use   Smoking status: Former Smoker   Smokeless tobacco: Current User    Types: Snuff  Vaping Use   Vaping Use: Never used  Substance Use Topics   Alcohol use: Yes    Comment: rare    Drug use: Yes    Types: Marijuana     Family Hx: The patient's family history is not on file.  Review of Systems  Constitutional: Negative for fever.  Respiratory: Negative for cough.    Gastrointestinal: Negative for diarrhea, hematochezia, melena and vomiting.  Genitourinary: Negative for hematuria.  All other systems reviewed and are negative.    EKGs/Labs/Other Test Reviewed:    EKG:  EKG is   ordered today.  The ekg ordered today demonstrates normal sinus rhythm, HR 95, normal axis, no ST-T wave changes, QTC 437  Recent Labs: No results found for requested labs within last 8760 hours.   Recent Lipid Panel No results found for: CHOL, TRIG, HDL, CHOLHDL, LDLCALC, LDLDIRECT    Risk Assessment/Calculations:      Physical Exam:    VS:  BP 124/80    Pulse 94    Ht 6' (1.829 m)    Wt 242 lb 3.2 oz (109.9 kg)    SpO2 96%    BMI 32.85 kg/m     Wt Readings from Last 3 Encounters:  07/25/20 242 lb 3.2 oz (109.9 kg)  06/27/20 242 lb 9.6 oz (110 kg)  05/11/20 252 lb (114.3 kg)     Constitutional:      Appearance: Healthy appearance. Not in distress.  Neck:     Vascular: No JVR. JVD normal.  Pulmonary:     Effort: Pulmonary effort is normal.     Breath sounds: No wheezing. No rales.  Cardiovascular:     Normal rate. Regular rhythm. Normal S1. Normal S2.     Murmurs: There is no murmur.  Edema:    Peripheral edema absent.  Abdominal:     Palpations: Abdomen is soft. There is no hepatomegaly.  Skin:    General: Skin is warm and dry.  Neurological:     General: No focal deficit present.     Mental Status: Alert and oriented to person, place and time.     Cranial Nerves: Cranial nerves are intact.       ASSESSMENT & PLAN:    1. Coronary artery disease involving native coronary artery of native heart with angina pectoris [facility]) He has a history of a nuclear stress test demonstrating inferolateral scar with peri-infarct ischemia and normal EF.  He is on 2 antianginal agents and continues to have CCS class II-III symptoms.  I have reviewed his case further with Dr. Harl Bowie.  We agreed the patient should undergo cardiac catheterization to further define and  manage his symptoms.  His electrocardiogram today does not demonstrate any acute changes.  Continue aspirin, atorvastatin, isosorbide, propranolol.  Proceed with cardiac catheterization in the next 1 to 2 weeks.  2. PVC (premature ventricular contraction) No apparent PVCs on electrocardiogram today.  Continue beta-blocker.  3. Mixed hyperlipidemia Continue atorvastatin.  4. Essential hypertension The patient's blood pressure is controlled on his current regimen.  Continue current therapy.    Shared Decision Making/Informed Consent The risks [stroke (1 in 1000), death (1  in 1000), kidney failure [usually temporary] (1 in 500), bleeding (1 in 200), allergic reaction [possibly serious] (1 in 200)], benefits (diagnostic support and management of coronary artery disease) and alternatives of a cardiac catheterization were discussed in detail with Henry Webb and he is willing to proceed.     Dispo:  Return for Post Procedure Follow Up w/ Dr. Harl Bowie or APP.   Medication Adjustments/Labs and Tests Ordered: Current medicines are reviewed at length with the patient today.  Concerns regarding medicines are outlined above.  Tests Ordered: Orders Placed This Encounter  Procedures   Basic Metabolic Panel (BMET)   CBC   EKG 12-Lead   Medication Changes: No orders of the defined types were placed in this encounter.   Signed, Richardson Dopp, PA-C  07/25/2020 8:52 AM    [facility] [place], [place], [place]  [address] Phone: [phone]; Fax: [phone]

## 2020-07-24 NOTE — Progress Notes (Addendum)
Cardiology Office Note:    Date:  07/25/2020   ID:  Henry Webb, DOB Mar 16, 1971, MRN [id number]  PCP:  Celene Squibb, MD   [place]  Cardiologist:  Carlyle Dolly, MD   Advanced Practice Provider:  None  Electrophysiologist:  None       Referring MD: Celene Squibb, MD   Chief Complaint:  Follow-up (CAD )    Patient Profile:    Henry Webb is a 49 y.o. male with:   Palpitations ? 04/2019 monitor - 1% PVCs ? Rx: Propranolol  Coronary artery disease  ? Stress PET at [facility] 02/2019: Chronic calcifications, no ischemia ? Myoview 1/22: inf-lat infarct w peri-infarct ischemia, normal EF; intermediate risk  Sarcoidosis ? PET at [facility] 02/2019: no cardiac sarcoid  Hypertension ("whitecoat")  Migraine headaches  Morbid obesity   Prior CV studies:  Myoview 06/06/2020 Inf-lat MI with mod peri-infarct ischemia, EF 55-65, intermediate risk  GXT 05/16/2020 No ST segments to suggest ischemia, + CP, Duke TM score 2.5; intermediate risk   Event monitor 05/30/2019  Min HR 76, Max HR 147, Avg HR 105  Rare supraventricular ectopy in the form of isolated PACs  Occasional ventricular ectopy. 1% PVC burden. In the form of isolated PVCs, couplets, bigeminy, trigeminy. No NSVT or VT.  Cardiac PET 02/2019 [facility]) - No evidence of cardiac sarcoidosis - Perfusion: Normal perfusion in the left ventricular myocardium on resting images. Metabolism: No abnormal FDG uptake noted.  - Global systolic function is mildly reduced. - Coronary calcifications are noted  History of Present Illness:    Henry Webb was last seen 06/27/20.  He is being managed medically for presumed CAD with angina.  Prior Myoview study was intermediate risk with inferolateral infarct with mod peri-infarct ischemia.  He noted improved symptoms.  I adjusted his beta-blocker further due to uncontrolled BP and elevated HR.  He returns for f/u.  He is here alone.  He continues to have exertional  chest symptoms.  He mainly notes substernal chest tightness/heaviness if he walks a distance carrying something heavy.  He also has left arm discomfort as well as interscapular back pain.  He has associated shortness of breath.  He has not had syncope but does get lightheaded at times.  He has not had orthopnea, leg edema.  Past Medical History:  Diagnosis Date  . Anxiety   . Depression   . Migraine   . Morbid obesity ([place])   . Palpitations   . Sarcoidosis   . Tachycardia   . Testicular hypofunction     Current Medications: Current Meds  Medication Sig  . aspirin EC 81 MG tablet Take 1 tablet (81 mg total) by mouth daily. Swallow whole.  Marland Kitchen atorvastatin (LIPITOR) 40 MG tablet TK 1 T PO NIGHTLY FOR CHOLESTEROL  . cyclobenzaprine (FLEXERIL) 10 MG tablet Take 10 mg by mouth 3 (three) times daily.  . isosorbide mononitrate (IMDUR) 30 MG 24 hr tablet Take 1 tablet (30 mg total) by mouth daily.  . nitroGLYCERIN (NITROSTAT) 0.4 MG SL tablet Place 1 tablet (0.4 mg total) under the tongue every 5 (five) minutes as needed for chest pain.  Marland Kitchen propranolol ER (INDERAL LA) 160 MG SR capsule Take 1 capsule (160 mg total) by mouth daily.  . sertraline (ZOLOFT) 100 MG tablet Take 100 mg by mouth daily.  . temazepam (RESTORIL) 30 MG capsule Take 30 mg by mouth at bedtime as needed for sleep.  . Testosterone Cypionate 200 MG/ML SOLN Inject  1 mL as directed every 14 (fourteen) days.  . Venlafaxine HCl 225 MG TB24 Take 1 tablet by mouth daily.     Allergies:   Patient has no known allergies.   Social History   Tobacco Use  . Smoking status: Former Research scientist (life sciences)  . Smokeless tobacco: Current User    Types: Snuff  Vaping Use  . Vaping Use: Never used  Substance Use Topics  . Alcohol use: Yes    Comment: rare   . Drug use: Yes    Types: Marijuana     Family Hx: The patient's family history is not on file.  Review of Systems  Constitutional: Negative for fever.  Respiratory: Negative for cough.    Gastrointestinal: Negative for diarrhea, hematochezia, melena and vomiting.  Genitourinary: Negative for hematuria.  All other systems reviewed and are negative.    EKGs/Labs/Other Test Reviewed:    EKG:  EKG is   ordered today.  The ekg ordered today demonstrates normal sinus rhythm, HR 95, normal axis, no ST-T wave changes, QTC 437  Recent Labs: No results found for requested labs within last 8760 hours.   Recent Lipid Panel No results found for: CHOL, TRIG, HDL, CHOLHDL, LDLCALC, LDLDIRECT    Risk Assessment/Calculations:      Physical Exam:    VS:  BP 124/80   Pulse 94   Ht 6' (1.829 m)   Wt 242 lb 3.2 oz (109.9 kg)   SpO2 96%   BMI 32.85 kg/m     Wt Readings from Last 3 Encounters:  07/25/20 242 lb 3.2 oz (109.9 kg)  06/27/20 242 lb 9.6 oz (110 kg)  05/11/20 252 lb (114.3 kg)     Constitutional:      Appearance: Healthy appearance. Not in distress.  Neck:     Vascular: No JVR. JVD normal.  Pulmonary:     Effort: Pulmonary effort is normal.     Breath sounds: No wheezing. No rales.  Cardiovascular:     Normal rate. Regular rhythm. Normal S1. Normal S2.     Murmurs: There is no murmur.  Edema:    Peripheral edema absent.  Abdominal:     Palpations: Abdomen is soft. There is no hepatomegaly.  Skin:    General: Skin is warm and dry.  Neurological:     General: No focal deficit present.     Mental Status: Alert and oriented to person, place and time.     Cranial Nerves: Cranial nerves are intact.       ASSESSMENT & PLAN:    1. Coronary artery disease involving native coronary artery of native heart with angina pectoris [facility]) He has a history of a nuclear stress test demonstrating inferolateral scar with peri-infarct ischemia and normal EF.  He is on 2 antianginal agents and continues to have CCS class II-III symptoms.  I have reviewed his case further with Dr. Harl Bowie.  We agreed the patient should undergo cardiac catheterization to further define and  manage his symptoms.  His electrocardiogram today does not demonstrate any acute changes.  Continue aspirin, atorvastatin, isosorbide, propranolol.  Proceed with cardiac catheterization in the next 1 to 2 weeks.  2. PVC (premature ventricular contraction) No apparent PVCs on electrocardiogram today.  Continue beta-blocker.  3. Mixed hyperlipidemia Continue atorvastatin.  4. Essential hypertension The patient's blood pressure is controlled on his current regimen.  Continue current therapy.    Shared Decision Making/Informed Consent The risks [stroke (1 in 1000), death (1 in 2), kidney failure [facility]  temporary] (1 in 500), bleeding (1 in 200), allergic reaction [possibly serious] (1 in 200)], benefits (diagnostic support and management of coronary artery disease) and alternatives of a cardiac catheterization were discussed in detail with Mr. Helm and he is willing to proceed.     Dispo:  Return for Post Procedure Follow Up w/ Dr. Harl Bowie or APP.   Medication Adjustments/Labs and Tests Ordered: Current medicines are reviewed at length with the patient today.  Concerns regarding medicines are outlined above.  Tests Ordered: Orders Placed This Encounter  Procedures  . Basic Metabolic Panel (BMET)  . CBC  . EKG 12-Lead   Medication Changes: No orders of the defined types were placed in this encounter.   Signed, Richardson Dopp, PA-C  07/25/2020 8:52 AM    [facility] [place], [place], [place]  [address] Phone: [phone]; Fax: [phone]

## 2020-07-25 ENCOUNTER — Other Ambulatory Visit: Payer: Self-pay

## 2020-07-25 ENCOUNTER — Ambulatory Visit: Admitting: Physician Assistant

## 2020-07-25 ENCOUNTER — Encounter: Payer: Self-pay | Admitting: Physician Assistant

## 2020-07-25 VITALS — BP 124/80 | HR 94 | Ht 72.0 in | Wt 242.2 lb

## 2020-07-25 DIAGNOSIS — E782 Mixed hyperlipidemia: Secondary | ICD-10-CM | POA: Diagnosis not present

## 2020-07-25 DIAGNOSIS — I493 Ventricular premature depolarization: Secondary | ICD-10-CM

## 2020-07-25 DIAGNOSIS — I10 Essential (primary) hypertension: Secondary | ICD-10-CM | POA: Diagnosis not present

## 2020-07-25 DIAGNOSIS — I25119 Atherosclerotic heart disease of native coronary artery with unspecified angina pectoris: Secondary | ICD-10-CM | POA: Diagnosis not present

## 2020-07-25 NOTE — Patient Instructions (Addendum)
Medication Instructions:  Your physician recommends that you continue on your current medications as directed. Please refer to the Current Medication list given to you today.  *If you need a refill on your cardiac medications before your next appointment, please call your pharmacy*   Lab Work: Your physician recommends that you have lab work today: bmet/cbc  If you have labs (blood work) drawn today and your tests are completely normal, you will receive your results only by: Marland Kitchen MyChart Message (if you have MyChart) OR . A paper copy in the mail If you have any lab test that is abnormal or we need to change your treatment, we will call you to review the results.   Testing/Procedures: Your provider has recommended a cardiac catherization.   You will need a COVID test prior to your procedure - Go to Pre-Procedural COVID -19 testing at [address] in [place], [place] [address] on ____Saturday, March __5th_______________________________.   You are scheduled for a cardiac catheterization on Tuesday, March 8 with Dr. Neita Garnet or associate.  Please arrive at the [facility] (Main Entrance) at [facility] at [place] [facility] on Tuesday, March 8 at 8:30 am.  Free valet parking is available.   You are allowed only one visitor in the hospital with you. Both you and your visitor must wear masks.    Special note: Every effort is made to have your procedure done on time.   Please understand that emergencies sometimes delay a scheduled   procedure.  No solid foods after midnight on Monday, March 7. You may have clear liquids until 5 am on the day of your procedure. .   You may take your morning medications with a sip of water on the day of your procedure.  Please take a baby aspirin (81 mg) on the morning of your procedure.   Medications to [place] for a one night stay -- bring personal belongings.  Bring a current list of your medications  and current insurance cards.  You MUST have a responsible person to drive you home. Someone MUST be with you the first 24 hours after you arrive home or your discharge will be delayed. Wear clothes that are easy to get on and off and wear slip on shoes.    Coronary Angiogram A coronary angiogram, also called coronary angiography, is an X-ray procedure used to look at the arteries in the heart. In this procedure, a dye (contrast dye) is injected through a long, hollow tube (catheter). The catheter is about the size of a piece of cooked spaghetti and is inserted through your groin, wrist, or arm. The dye is injected into each artery, and X-rays are then taken to show if there is a blockage in the arteries of your heart.  LET [facility] CARE PROVIDER KNOW ABOUT:  Any allergies you have, including allergies to shellfish or contrast dye.    All medicines you are taking, including vitamins, herbs, eye drops, creams, and over-the-counter medicines.    Previous problems you or members of your family have had with the use of anesthetics.    Any blood disorders you have.    Previous surgeries you have had.  History of kidney problems or failure.    Other medical conditions you have.  RISKS AND COMPLICATIONS  Generally, a coronary angiogram is a safe procedure. However, about 1 person out of 1000 can have problems that may include:  Allergic  reaction to the dye.  Bleeding/bruising from the access site or other locations.  Kidney injury, especially in people with impaired kidney function.   Stroke (rare).  Heart attack (rare).  Irregular rhythms (rare)  Death (rare)  BEFORE THE PROCEDURE   Do not eat or drink anything after midnight the night before the procedure or as directed by your health care provider.    Ask your health care provider about changing or stopping your regular medicines. This is especially important if you are taking diabetes medicines or blood  thinners.  PROCEDURE  You may be given a medicine to help you relax (sedative) before the procedure. This medicine is given through an intravenous (IV) access tube that is inserted into one of your veins.    The area where the catheter will be inserted will be washed and shaved. This is usually done in the groin but may be done in the fold of your arm (near your elbow) or in the wrist.     A medicine will be given to numb the area where the catheter will be inserted (local anesthetic).    The health care provider will insert the catheter into an artery. The catheter will be guided by using a special type of X-ray (fluoroscopy) of the blood vessel being examined.    A special dye will then be injected into the catheter, and X-rays will be taken. The dye will help to show where any narrowing or blockages are located in the heart arteries.     AFTER THE PROCEDURE   If the procedure is done through the leg, you will be kept in bed lying flat for several hours. You will be instructed to not bend or cross your legs.  The insertion site will be checked frequently.    The pulse in your feet or wrist will be checked frequently.    Additional blood tests, X-rays, and an electrocardiogram may be done.       Follow-Up: At [facility], you and your health needs are our priority.  As part of our continuing mission to provide you with exceptional heart care, we have created designated Provider Care Teams.  These Care Teams include your primary Cardiologist (physician) and Advanced Practice Providers (APPs -  Physician Assistants and Nurse Practitioners) who all work together to provide you with the care you need, when you need it.  We recommend signing up for the patient portal called "MyChart".  Sign up information is provided on this After Visit Summary.  MyChart is used to connect with patients for Virtual Visits (Telemedicine).  Patients are able to view lab/test results, encounter notes,  upcoming appointments, etc.  Non-urgent messages can be sent to your provider as well.   To learn more about what you can do with MyChart, go to [email or web address].    Your next appointment:   2 week(s)  The format for your next appointment:   In Person  Provider:   Ermalinda Barrios, PA   Other Instructions None

## 2020-07-28 ENCOUNTER — Other Ambulatory Visit (HOSPITAL_COMMUNITY)
Admission: RE | Admit: 2020-07-28 | Discharge: 2020-07-28 | Disposition: A | Discharge status: Home / Self Care | Source: Ambulatory Visit | Attending: Cardiology | Admitting: Cardiology

## 2020-07-28 DIAGNOSIS — Z01812 Encounter for preprocedural laboratory examination: Secondary | ICD-10-CM | POA: Diagnosis present

## 2020-07-28 DIAGNOSIS — Z20822 Contact with and (suspected) exposure to covid-19: Secondary | ICD-10-CM | POA: Diagnosis not present

## 2020-07-28 LAB — SARS CORONAVIRUS 2 (TAT 6-24 HRS): SARS Coronavirus 2: NEGATIVE

## 2020-07-30 ENCOUNTER — Telehealth: Payer: Self-pay | Admitting: *Deleted

## 2020-07-30 NOTE — Telephone Encounter (Signed)
07/25/20 CMP/CBC results scanned to Epic under Lab tab

## 2020-07-30 NOTE — Telephone Encounter (Signed)
Pt contacted pre-catheterization scheduled at [facility] for: Tuesday July 31, 2020 10:30 AM Verified arrival time and place: [facility]) at: 8:30 AM  No solid food after midnight prior to cath, clear liquids until 5 AM day of procedure.  AM meds can be  taken pre-cath with sips of water including: ASA 81 mg   Confirmed patient has responsible adult to drive home post procedure and be with patient first 24 hours after arriving home: yes  You are allowed ONE visitor in the waiting room during the time you are at the hospital for your procedure. Both you and your visitor must wear a mask once you enter the hospital.   Reviewed procedure/mask/visitor instructions with patient.  Lab results 07/25/20 not in [place] states lab was not done in our office lab but at [facility] location down the street. I called [place] labs were done under account for Dr Dinah Beers will request lab results be made available in Epic and is faxing copy of results to [employer] records- I have asked medical records  to scan to Epic ASAP.

## 2020-07-31 ENCOUNTER — Encounter (HOSPITAL_COMMUNITY): Payer: Self-pay | Admitting: Emergency Medicine

## 2020-07-31 ENCOUNTER — Telehealth: Payer: Self-pay | Admitting: Physician Assistant

## 2020-07-31 ENCOUNTER — Observation Stay (HOSPITAL_COMMUNITY)
Admission: EM | Admit: 2020-07-31 | Discharge: 2020-08-01 | Disposition: A | Discharge status: Home / Self Care | Attending: Cardiovascular Disease | Admitting: Cardiovascular Disease

## 2020-07-31 ENCOUNTER — Other Ambulatory Visit: Payer: Self-pay

## 2020-07-31 ENCOUNTER — Encounter (HOSPITAL_COMMUNITY)
Admission: RE | Disposition: A | Discharge status: Home / Self Care | Payer: Self-pay | Source: Home / Self Care | Attending: Cardiology

## 2020-07-31 ENCOUNTER — Emergency Department (HOSPITAL_COMMUNITY)

## 2020-07-31 ENCOUNTER — Ambulatory Visit (HOSPITAL_BASED_OUTPATIENT_CLINIC_OR_DEPARTMENT_OTHER)
Admission: RE | Admit: 2020-07-31 | Discharge: 2020-07-31 | Disposition: A | Discharge status: Home / Self Care | Source: Home / Self Care | Attending: Cardiology | Admitting: Cardiology

## 2020-07-31 ENCOUNTER — Other Ambulatory Visit: Payer: Self-pay | Admitting: Cardiology

## 2020-07-31 DIAGNOSIS — Z79899 Other long term (current) drug therapy: Secondary | ICD-10-CM | POA: Insufficient documentation

## 2020-07-31 DIAGNOSIS — R001 Bradycardia, unspecified: Secondary | ICD-10-CM | POA: Insufficient documentation

## 2020-07-31 DIAGNOSIS — I10 Essential (primary) hypertension: Secondary | ICD-10-CM | POA: Insufficient documentation

## 2020-07-31 DIAGNOSIS — E669 Obesity, unspecified: Secondary | ICD-10-CM | POA: Diagnosis not present

## 2020-07-31 DIAGNOSIS — I25119 Atherosclerotic heart disease of native coronary artery with unspecified angina pectoris: Secondary | ICD-10-CM

## 2020-07-31 DIAGNOSIS — I493 Ventricular premature depolarization: Secondary | ICD-10-CM | POA: Insufficient documentation

## 2020-07-31 DIAGNOSIS — Z7982 Long term (current) use of aspirin: Secondary | ICD-10-CM | POA: Insufficient documentation

## 2020-07-31 DIAGNOSIS — R55 Syncope and collapse: Principal | ICD-10-CM | POA: Diagnosis present

## 2020-07-31 DIAGNOSIS — R61 Generalized hyperhidrosis: Secondary | ICD-10-CM | POA: Diagnosis not present

## 2020-07-31 DIAGNOSIS — E785 Hyperlipidemia, unspecified: Secondary | ICD-10-CM | POA: Insufficient documentation

## 2020-07-31 DIAGNOSIS — I2582 Chronic total occlusion of coronary artery: Secondary | ICD-10-CM | POA: Insufficient documentation

## 2020-07-31 DIAGNOSIS — F419 Anxiety disorder, unspecified: Secondary | ICD-10-CM | POA: Diagnosis not present

## 2020-07-31 DIAGNOSIS — Z6832 Body mass index (BMI) 32.0-32.9, adult: Secondary | ICD-10-CM | POA: Insufficient documentation

## 2020-07-31 DIAGNOSIS — I2510 Atherosclerotic heart disease of native coronary artery without angina pectoris: Secondary | ICD-10-CM

## 2020-07-31 DIAGNOSIS — Z8719 Personal history of other diseases of the digestive system: Secondary | ICD-10-CM | POA: Diagnosis not present

## 2020-07-31 DIAGNOSIS — F329 Major depressive disorder, single episode, unspecified: Secondary | ICD-10-CM | POA: Diagnosis not present

## 2020-07-31 DIAGNOSIS — M79602 Pain in left arm: Secondary | ICD-10-CM | POA: Diagnosis not present

## 2020-07-31 DIAGNOSIS — Z87891 Personal history of nicotine dependence: Secondary | ICD-10-CM | POA: Insufficient documentation

## 2020-07-31 DIAGNOSIS — E782 Mixed hyperlipidemia: Secondary | ICD-10-CM | POA: Insufficient documentation

## 2020-07-31 DIAGNOSIS — R110 Nausea: Secondary | ICD-10-CM | POA: Diagnosis not present

## 2020-07-31 DIAGNOSIS — D869 Sarcoidosis, unspecified: Secondary | ICD-10-CM | POA: Insufficient documentation

## 2020-07-31 DIAGNOSIS — E7800 Pure hypercholesterolemia, unspecified: Secondary | ICD-10-CM | POA: Diagnosis present

## 2020-07-31 DIAGNOSIS — Z20822 Contact with and (suspected) exposure to covid-19: Secondary | ICD-10-CM | POA: Diagnosis not present

## 2020-07-31 DIAGNOSIS — Z8679 Personal history of other diseases of the circulatory system: Secondary | ICD-10-CM | POA: Diagnosis not present

## 2020-07-31 DIAGNOSIS — Z95820 Peripheral vascular angioplasty status with implants and grafts: Secondary | ICD-10-CM

## 2020-07-31 DIAGNOSIS — I209 Angina pectoris, unspecified: Secondary | ICD-10-CM | POA: Diagnosis present

## 2020-07-31 HISTORY — PX: CORONARY STENT INTERVENTION: CATH118234

## 2020-07-31 HISTORY — PX: LEFT HEART CATH AND CORONARY ANGIOGRAPHY: CATH118249

## 2020-07-31 HISTORY — PX: INTRAVASCULAR PRESSURE WIRE/FFR STUDY: CATH118243

## 2020-07-31 LAB — I-STAT CHEM 8, ED
BUN: 14 mg/dL (ref 6–20)
Calcium, Ion: 1.18 mmol/L (ref 1.15–1.40)
Chloride: 106 mmol/L (ref 98–111)
Creatinine, Ser: 0.9 mg/dL (ref 0.61–1.24)
Glucose, Bld: 126 mg/dL — ABNORMAL HIGH (ref 70–99)
HCT: 52 % (ref 39.0–52.0)
Hemoglobin: 17.7 g/dL — ABNORMAL HIGH (ref 13.0–17.0)
Potassium: 4.8 mmol/L (ref 3.5–5.1)
Sodium: 141 mmol/L (ref 135–145)
TCO2: 22 mmol/L (ref 22–32)

## 2020-07-31 LAB — CBC WITH DIFFERENTIAL/PLATELET
Abs Immature Granulocytes: 0.19 10*3/uL — ABNORMAL HIGH (ref 0.00–0.07)
Basophils Absolute: 0.1 10*3/uL (ref 0.0–0.1)
Basophils Relative: 1 %
Eosinophils Absolute: 0.5 10*3/uL (ref 0.0–0.5)
Eosinophils Relative: 3 %
HCT: 52.8 % — ABNORMAL HIGH (ref 39.0–52.0)
Hemoglobin: 17.4 g/dL — ABNORMAL HIGH (ref 13.0–17.0)
Immature Granulocytes: 1 %
Lymphocytes Relative: 11 %
Lymphs Abs: 1.9 10*3/uL (ref 0.7–4.0)
MCH: 29.9 pg (ref 26.0–34.0)
MCHC: 33 g/dL (ref 30.0–36.0)
MCV: 90.7 fL (ref 80.0–100.0)
Monocytes Absolute: 0.8 10*3/uL (ref 0.1–1.0)
Monocytes Relative: 5 %
Neutro Abs: 13 10*3/uL — ABNORMAL HIGH (ref 1.7–7.7)
Neutrophils Relative %: 79 %
Platelets: 377 10*3/uL (ref 150–400)
RBC: 5.82 MIL/uL — ABNORMAL HIGH (ref 4.22–5.81)
RDW: 13.2 % (ref 11.5–15.5)
WBC: 16.4 10*3/uL — ABNORMAL HIGH (ref 4.0–10.5)
nRBC: 0 % (ref 0.0–0.2)

## 2020-07-31 LAB — RESP PANEL BY RT-PCR (FLU A&B, COVID) ARPGX2
Influenza A by PCR: NEGATIVE
Influenza B by PCR: NEGATIVE
SARS Coronavirus 2 by RT PCR: NEGATIVE

## 2020-07-31 LAB — COMPREHENSIVE METABOLIC PANEL
ALT: 35 U/L (ref 0–44)
AST: 26 U/L (ref 15–41)
Albumin: 4.2 g/dL (ref 3.5–5.0)
Alkaline Phosphatase: 81 U/L (ref 38–126)
Anion gap: 9 (ref 5–15)
BUN: 11 mg/dL (ref 6–20)
CO2: 23 mmol/L (ref 22–32)
Calcium: 9.3 mg/dL (ref 8.9–10.3)
Chloride: 105 mmol/L (ref 98–111)
Creatinine, Ser: 1 mg/dL (ref 0.61–1.24)
GFR, Estimated: >60 mL/min (ref >60)
Glucose, Bld: 133 mg/dL — ABNORMAL HIGH (ref 70–99)
Potassium: 5.2 mmol/L — ABNORMAL HIGH (ref 3.5–5.1)
Sodium: 137 mmol/L (ref 135–145)
Total Bilirubin: 1.5 mg/dL — ABNORMAL HIGH (ref 0.3–1.2)
Total Protein: 7.3 g/dL (ref 6.5–8.1)

## 2020-07-31 LAB — POCT ACTIVATED CLOTTING TIME
Activated Clotting Time: 428 seconds
Activated Clotting Time: 386 seconds

## 2020-07-31 LAB — TROPONIN I (HIGH SENSITIVITY): Troponin I (High Sensitivity): 24 ng/L — ABNORMAL HIGH (ref <18)

## 2020-07-31 SURGERY — LEFT HEART CATH AND CORONARY ANGIOGRAPHY
Anesthesia: LOCAL

## 2020-07-31 MED ORDER — NITROGLYCERIN 0.4 MG SL SUBL: 0.4000 mg | SUBLINGUAL_TABLET | SUBLINGUAL | Status: DC | PRN

## 2020-07-31 MED ORDER — ACETAMINOPHEN 325 MG PO TABS
650.0000 mg | ORAL_TABLET | ORAL | Status: DC | PRN
  Filled 2020-07-31: qty 2

## 2020-07-31 MED ORDER — TICAGRELOR 90 MG PO TABS
ORAL_TABLET | ORAL | Status: DC | PRN
  Administered 2020-07-31: 180 mg via ORAL

## 2020-07-31 MED ORDER — LIDOCAINE HCL (PF) 1 % IJ SOLN
INTRAMUSCULAR | Status: DC | PRN
  Administered 2020-07-31: 2 mL via INTRADERMAL

## 2020-07-31 MED ORDER — CYCLOBENZAPRINE HCL 10 MG PO TABS
10.0000 mg | ORAL_TABLET | Freq: Three times a day (TID) | ORAL | Status: DC | PRN
  Administered 2020-08-01: 10 mg via ORAL
  Filled 2020-07-31: qty 1

## 2020-07-31 MED ORDER — PROPRANOLOL HCL ER 160 MG PO CP24: 160.0000 mg | ORAL_CAPSULE | Freq: Every day | ORAL | Status: DC

## 2020-07-31 MED ORDER — ANGIOPLASTY BOOK
Status: AC
  Filled 2020-07-31: qty 1

## 2020-07-31 MED ORDER — NITROGLYCERIN 1 MG/10 ML FOR IR/CATH LAB
INTRA_ARTERIAL | Status: DC | PRN
  Administered 2020-07-31 (×3): 200 ug via INTRACORONARY

## 2020-07-31 MED ORDER — SERTRALINE HCL 100 MG PO TABS: 100.0000 mg | ORAL_TABLET | Freq: Every day | ORAL | Status: DC

## 2020-07-31 MED ORDER — ACETAMINOPHEN 325 MG PO TABS: 650.0000 mg | ORAL_TABLET | ORAL | Status: DC | PRN

## 2020-07-31 MED ORDER — ALPRAZOLAM 0.25 MG PO TABS: 0.2500 mg | ORAL_TABLET | Freq: Two times a day (BID) | ORAL | Status: DC | PRN

## 2020-07-31 MED ORDER — TICAGRELOR 90 MG PO TABS
90.0000 mg | ORAL_TABLET | Freq: Two times a day (BID) | ORAL | Status: DC
  Administered 2020-07-31 – 2020-08-01 (×2): 90 mg via ORAL
  Filled 2020-07-31 (×2): qty 1

## 2020-07-31 MED ORDER — ENOXAPARIN SODIUM 40 MG/0.4ML ~~LOC~~ SOLN
40.0000 mg | SUBCUTANEOUS | Status: DC
  Administered 2020-07-31: 40 mg via SUBCUTANEOUS
  Filled 2020-07-31: qty 0.4

## 2020-07-31 MED ORDER — ASPIRIN EC 81 MG PO TBEC: 81.0000 mg | DELAYED_RELEASE_TABLET | Freq: Every day | ORAL | Status: DC

## 2020-07-31 MED ORDER — ISOSORBIDE MONONITRATE ER 30 MG PO TB24
30.0000 mg | ORAL_TABLET | Freq: Every day | ORAL | Status: DC
  Administered 2020-08-01: 30 mg via ORAL
  Filled 2020-07-31: qty 1

## 2020-07-31 MED ORDER — TICAGRELOR 90 MG PO TABS
ORAL_TABLET | ORAL | Status: AC
  Filled 2020-07-31: qty 2

## 2020-07-31 MED ORDER — HEPARIN (PORCINE) IN NACL 1000-0.9 UT/500ML-% IV SOLN
INTRAVENOUS | Status: AC
  Filled 2020-07-31: qty 500

## 2020-07-31 MED ORDER — HYDRALAZINE HCL 20 MG/ML IJ SOLN: 10.0000 mg | INTRAMUSCULAR | Status: AC | PRN

## 2020-07-31 MED ORDER — MIDAZOLAM HCL 2 MG/2ML IJ SOLN
INTRAMUSCULAR | Status: DC | PRN
  Administered 2020-07-31: 1 mg via INTRAVENOUS
  Administered 2020-07-31: 2 mg via INTRAVENOUS

## 2020-07-31 MED ORDER — ATORVASTATIN CALCIUM 40 MG PO TABS
40.0000 mg | ORAL_TABLET | Freq: Every day | ORAL | Status: DC
  Administered 2020-07-31: 40 mg via ORAL
  Filled 2020-07-31: qty 1

## 2020-07-31 MED ORDER — TICAGRELOR 90 MG PO TABS: 90.0000 mg | ORAL_TABLET | Freq: Two times a day (BID) | ORAL | 11 refills | Status: DC

## 2020-07-31 MED ORDER — SODIUM CHLORIDE 0.9 % WEIGHT BASED INFUSION: 1.0000 mL/kg/h | INTRAVENOUS | Status: AC

## 2020-07-31 MED ORDER — IOHEXOL 350 MG/ML SOLN
INTRAVENOUS | Status: DC | PRN
  Administered 2020-07-31: 215 mL via INTRA_ARTERIAL

## 2020-07-31 MED ORDER — HEPARIN SODIUM (PORCINE) 1000 UNIT/ML IJ SOLN
INTRAMUSCULAR | Status: DC | PRN
  Administered 2020-07-31: 6000 [IU] via INTRAVENOUS
  Administered 2020-07-31: 5000 [IU] via INTRAVENOUS

## 2020-07-31 MED ORDER — SERTRALINE HCL 100 MG PO TABS
100.0000 mg | ORAL_TABLET | Freq: Every day | ORAL | Status: DC
  Administered 2020-08-01: 100 mg via ORAL
  Filled 2020-07-31: qty 1

## 2020-07-31 MED ORDER — MIDAZOLAM HCL 2 MG/2ML IJ SOLN
INTRAMUSCULAR | Status: AC
  Filled 2020-07-31: qty 2

## 2020-07-31 MED ORDER — VENLAFAXINE HCL ER 225 MG PO TB24: 225.0000 mg | ORAL_TABLET | Freq: Every day | ORAL | Status: DC

## 2020-07-31 MED ORDER — TICAGRELOR 90 MG PO TABS: 90.0000 mg | ORAL_TABLET | Freq: Two times a day (BID) | ORAL | Status: DC

## 2020-07-31 MED ORDER — TEMAZEPAM 30 MG PO CAPS
30.0000 mg | ORAL_CAPSULE | Freq: Every day | ORAL | Status: DC
  Administered 2020-07-31: 30 mg via ORAL
  Filled 2020-07-31: qty 1

## 2020-07-31 MED ORDER — ASPIRIN EC 81 MG PO TBEC
81.0000 mg | DELAYED_RELEASE_TABLET | Freq: Every day | ORAL | Status: DC
  Administered 2020-08-01: 81 mg via ORAL
  Filled 2020-07-31: qty 1

## 2020-07-31 MED ORDER — HEPARIN SODIUM (PORCINE) 1000 UNIT/ML IJ SOLN
INTRAMUSCULAR | Status: AC
  Filled 2020-07-31: qty 1

## 2020-07-31 MED ORDER — ONDANSETRON HCL 4 MG/2ML IJ SOLN: 4.0000 mg | Freq: Four times a day (QID) | INTRAMUSCULAR | Status: DC | PRN

## 2020-07-31 MED ORDER — ASPIRIN 81 MG PO CHEW: 81.0000 mg | CHEWABLE_TABLET | ORAL | Status: DC

## 2020-07-31 MED ORDER — SODIUM CHLORIDE 0.9 % WEIGHT BASED INFUSION
3.0000 mL/kg/h | INTRAVENOUS | Status: AC
  Administered 2020-07-31: 3 mL/kg/h via INTRAVENOUS

## 2020-07-31 MED ORDER — VENLAFAXINE HCL ER 75 MG PO CP24
225.0000 mg | ORAL_CAPSULE | Freq: Every day | ORAL | Status: DC
  Administered 2020-08-01: 225 mg via ORAL
  Filled 2020-07-31: qty 3

## 2020-07-31 MED ORDER — SODIUM CHLORIDE 0.9 % IV SOLN: 250.0000 mL | INTRAVENOUS | Status: DC | PRN

## 2020-07-31 MED ORDER — VERAPAMIL HCL 2.5 MG/ML IV SOLN
INTRAVENOUS | Status: AC
  Filled 2020-07-31: qty 2

## 2020-07-31 MED ORDER — TESTOSTERONE CYPIONATE 200 MG/ML IJ SOLN: 200.0000 mg | INTRAMUSCULAR | Status: DC

## 2020-07-31 MED ORDER — SODIUM CHLORIDE 0.9% FLUSH: 3.0000 mL | INTRAVENOUS | Status: DC | PRN

## 2020-07-31 MED ORDER — FENTANYL CITRATE (PF) 100 MCG/2ML IJ SOLN
INTRAMUSCULAR | Status: DC | PRN
  Administered 2020-07-31 (×2): 25 ug via INTRAVENOUS

## 2020-07-31 MED ORDER — SODIUM CHLORIDE 0.9% FLUSH: 3.0000 mL | Freq: Two times a day (BID) | INTRAVENOUS | Status: DC

## 2020-07-31 MED ORDER — NITROGLYCERIN 1 MG/10 ML FOR IR/CATH LAB
INTRA_ARTERIAL | Status: AC
  Filled 2020-07-31: qty 10

## 2020-07-31 MED ORDER — ROSUVASTATIN CALCIUM 40 MG PO TABS: 40.0000 mg | ORAL_TABLET | Freq: Every day | ORAL | Status: DC

## 2020-07-31 MED ORDER — ZOLPIDEM TARTRATE 5 MG PO TABS: 5.0000 mg | ORAL_TABLET | Freq: Every evening | ORAL | Status: DC | PRN

## 2020-07-31 MED ORDER — FENTANYL CITRATE (PF) 100 MCG/2ML IJ SOLN
INTRAMUSCULAR | Status: AC
  Filled 2020-07-31: qty 2

## 2020-07-31 MED ORDER — SODIUM CHLORIDE 0.9 % WEIGHT BASED INFUSION: 1.0000 mL/kg/h | INTRAVENOUS | Status: DC

## 2020-07-31 MED ORDER — ROSUVASTATIN CALCIUM 40 MG PO TABS: 40.0000 mg | ORAL_TABLET | Freq: Every day | ORAL | 3 refills | Status: DC

## 2020-07-31 MED ORDER — TEMAZEPAM 30 MG PO CAPS: 30.0000 mg | ORAL_CAPSULE | Freq: Every day | ORAL | Status: DC

## 2020-07-31 MED ORDER — CYCLOBENZAPRINE HCL 10 MG PO TABS: 10.0000 mg | ORAL_TABLET | Freq: Three times a day (TID) | ORAL | Status: DC | PRN

## 2020-07-31 MED ORDER — ISOSORBIDE MONONITRATE ER 30 MG PO TB24: 30.0000 mg | ORAL_TABLET | Freq: Every day | ORAL | Status: DC

## 2020-07-31 MED ORDER — SODIUM CHLORIDE 0.9% FLUSH
3.0000 mL | Freq: Two times a day (BID) | INTRAVENOUS | Status: DC
Start: 2020-07-31 — End: 2020-08-01
  Administered 2020-07-31: 3 mL via INTRAVENOUS

## 2020-07-31 MED ORDER — VERAPAMIL HCL 2.5 MG/ML IV SOLN
INTRAVENOUS | Status: DC | PRN
  Administered 2020-07-31: 10 mL via INTRA_ARTERIAL

## 2020-07-31 MED ORDER — LIDOCAINE HCL (PF) 1 % IJ SOLN
INTRAMUSCULAR | Status: AC
  Filled 2020-07-31: qty 30

## 2020-07-31 MED ORDER — HEPARIN (PORCINE) IN NACL 1000-0.9 UT/500ML-% IV SOLN
INTRAVENOUS | Status: DC | PRN
  Administered 2020-07-31 (×2): 500 mL

## 2020-07-31 MED ORDER — ATORVASTATIN CALCIUM 40 MG PO TABS: 40.0000 mg | ORAL_TABLET | Freq: Every day | ORAL | Status: DC

## 2020-07-31 MED FILL — BRILINTA 90 MG TABLET: 90 | 30 days supply | Qty: 60 | Fill #0

## 2020-07-31 SURGICAL SUPPLY — 20 items
BALLN SAPPHIRE 2.5X15 (BALLOONS) ×2
BALLN ~~LOC~~ EMERGE MR 3.0X20 (BALLOONS) ×2
BALLOON SAPPHIRE 2.5X15 (BALLOONS) ×1 IMPLANT
BALLOON ~~LOC~~ EMERGE MR 3.0X20 (BALLOONS) ×1 IMPLANT
CATH 5FR JL3.5 JR4 ANG PIG MP (CATHETERS) ×2 IMPLANT
CATH LAUNCHER 6FR EBU3.5 (CATHETERS) ×2 IMPLANT
DEVICE RAD COMP TR BAND LRG (VASCULAR PRODUCTS) ×2 IMPLANT
GLIDESHEATH SLEND SS 6F .021 (SHEATH) ×2 IMPLANT
GUIDEWIRE INQWIRE 1.5J.035X260 (WIRE) ×1 IMPLANT
GUIDEWIRE PRESSURE X 175 (WIRE) ×4 IMPLANT
INQWIRE 1.5J .035X260CM (WIRE) ×2
KIT ENCORE 26 ADVANTAGE (KITS) ×2 IMPLANT
KIT ESSENTIALS PG (KITS) ×2 IMPLANT
KIT HEART LEFT (KITS) ×2 IMPLANT
PACK CARDIAC CATHETERIZATION (CUSTOM PROCEDURE TRAY) ×2 IMPLANT
STENT RESOLUTE ONYX 2.75X18 (Permanent Stent) ×2 IMPLANT
STENT RESOLUTE ONYX3.0X38 (Permanent Stent) ×2 IMPLANT
TRANSDUCER W/STOPCOCK (MISCELLANEOUS) ×2 IMPLANT
TUBING CIL FLEX 10 FLL-RA (TUBING) ×2 IMPLANT
WIRE ASAHI PROWATER 180CM (WIRE) ×2 IMPLANT

## 2020-07-31 NOTE — Telephone Encounter (Signed)
   Wife called because they were on their way home and he began sweating severely, c/o pain in both arms, nausea and listless.   Her cell number no answer, left msg.   Husband's cell called, she answered.  Requested she call 911 and stay there till they come.  Advised too dangerous for her to drive him any further.  She agrees to this plan.  Rosaria Ferries, PA-C 07/31/2020 6:20 PM

## 2020-07-31 NOTE — Interval H&P Note (Signed)
History and Physical Interval Note:  07/31/2020 9:34 AM  Henry Webb  has presented today for surgery, with the diagnosis of CAD w/ angina.  The various methods of treatment have been discussed with the patient and family. After consideration of risks, benefits and other options for treatment, the patient has consented to  Procedure(s): LEFT HEART CATH AND CORONARY ANGIOGRAPHY (N/A) as a surgical intervention.  The patient's history has been reviewed, patient examined, no change in status, stable for surgery.  I have reviewed the patient's chart and labs.  Questions were answered to the patient's satisfaction.   Cath Lab Visit (complete for each Cath Lab visit)  Clinical Evaluation Leading to the Procedure:   ACS: No.  Non-ACS:    Anginal Classification: CCS III  Anti-ischemic medical therapy: Maximal Therapy (2 or more classes of medications)  Non-Invasive Test Results: Intermediate-risk stress test findings: cardiac mortality 1-3%/year  Prior CABG: No previous CABG        [facility] 07/31/2020 9:34 AM

## 2020-07-31 NOTE — Discharge Instructions (Signed)
Radial Site Care  This sheet gives you information about how to care for yourself after your procedure. Your health care provider may also give you more specific instructions. If you have problems or questions, contact your health care provider. What can I expect after the procedure? After the procedure, it is common to have:  Bruising and tenderness at the catheter insertion area. Follow these instructions at home: Medicines  Take over-the-counter and prescription medicines only as told by your health care provider. Insertion site care  Follow instructions from your health care provider about how to take care of your insertion site. Make sure you: ? Wash your hands with soap and water before you change your bandage (dressing). If soap and water are not available, use hand sanitizer. ? Change your dressing as told by your health care provider. ? Leave stitches (sutures), skin glue, or adhesive strips in place. These skin closures may need to stay in place for 2 weeks or longer. If adhesive strip edges start to loosen and curl up, you may trim the loose edges. Do not remove adhesive strips completely unless your health care provider tells you to do that.  Check your insertion site every day for signs of infection. Check for: ? Redness, swelling, or pain. ? Fluid or blood. ? Pus or a bad smell. ? Warmth.  Do not take baths, swim, or use a hot tub until your health care provider approves.  You may shower 24-48 hours after the procedure, or as directed by your health care provider. ? Remove the dressing and gently wash the site with plain soap and water. ? Pat the area dry with a clean towel. ? Do not rub the site. That could cause bleeding.  Do not apply powder or lotion to the site. Activity  For 24 hours after the procedure, or as directed by your health care provider: ? Do not flex or bend the affected arm. ? Do not push or pull heavy objects with the affected arm. ? Do not drive  yourself home from the hospital or clinic. You may drive 24 hours after the procedure unless your health care provider tells you not to. ? Do not operate machinery or power tools.  Do not lift anything that is heavier than 10 lb (4.5 kg), or the limit that you are told, until your health care provider says that it is safe.  Ask your health care provider when it is okay to: ? Return to work or school. ? Resume usual physical activities or sports. ? Resume sexual activity.   General instructions  If the catheter site starts to bleed, raise your arm and put firm pressure on the site. If the bleeding does not stop, get help right away. This is a medical emergency.  If you went home on the same day as your procedure, a responsible adult should be with you for the first 24 hours after you arrive home.  Keep all follow-up visits as told by your health care provider. This is important. Contact a health care provider if:  You have a fever.  You have redness, swelling, or yellow drainage around your insertion site. Get help right away if:  You have unusual pain at the radial site.  The catheter insertion area swells very fast.  The insertion area is bleeding, and the bleeding does not stop when you hold steady pressure on the area.  Your arm or hand becomes pale, cool, tingly, or numb. These symptoms may represent a serious   problem that is an emergency. Do not wait to see if the symptoms will go away. Get medical help right away. Call your local emergency services (911 in the [place] Do not drive yourself to the hospital. Summary  After the procedure, it is common to have bruising and tenderness at the site.  Follow instructions from your health care provider about how to take care of your radial site wound. Check the wound every day for signs of infection.  Do not lift anything that is heavier than 10 lb (4.5 kg), or the limit that you are told, until your health care provider says that it  is safe. This information is not intended to replace advice given to you by your health care provider. Make sure you discuss any questions you have with your health care provider. Document Revised: 06/17/2017 Document Reviewed: 06/17/2017 Elsevier Patient Education  2021 [employer]vier Inc.  

## 2020-07-31 NOTE — ED Triage Notes (Signed)
Per [facility], pt was discharged today from [facility] after having 2 stints placed today and becoming pale/diaphorectic and "having trouble staying awake."  EMS gave .5 Atropine at which time his heart rate went from 48 to 78.  Pt is now alert and oriented at this time.

## 2020-07-31 NOTE — H&P (Addendum)
Cardiology Admission History and Physical:   Patient ID: Henry Webb; MRN: [id number]; DOB: [birth year]   Admission date: 07/31/2020  Primary Care Provider: Celene Squibb, MD Primary Cardiologist: Carlyle Dolly, MD  Primary Electrophysiologist: None    Chief Complaint:  Decreased LOC  Patient Profile:   Henry Webb is a 50 y.o. male with a history of CAD, s/p DES x 2 today.   History of Present Illness:   Henry Webb was on his way home from the hospital when he had sudden onset of nausea followed by significant diaphoresis and left arm pain.  He could not keep his eyes open and his wife was having trouble waking him up.  She pulled over to the side of the road, and called 911.  On EMS arrival, his heart rate was in the 40s and he was given atropine.  The atropine improved his heart rate and the diaphoresis improved as well.  The left arm pain resolved.  That ECG is not available for review.  In the ER, he feels very tired and sleepy, but no other symptoms.  He states he has had episodes of diaphoresis before, but not in relation to any blood draws or other stimuli that he can remember.  At no time during all of this did he have any chest pain.  He is not short of breath.  He has not had any palpitations.  Of note, he says that bradycardia is very unusual for him because his heart rate usually runs high.   Past Medical History:  Diagnosis Date  . Anxiety   . Depression   . Migraine   . Morbid obesity ([place])   . Palpitations   . Sarcoidosis   . Tachycardia   . Testicular hypofunction     Past Surgical History:  Procedure Laterality Date  . Fusion    . LITHOTRIPSY       Medications Prior to Admission: Prior to Admission medications   Medication Sig Start Date End Date Taking? Authorizing Provider  aspirin EC 81 MG tablet Take 1 tablet (81 mg total) by mouth daily. Swallow whole. 06/27/20   Richardson Dopp T, PA-C  atorvastatin (LIPITOR) 40 MG tablet Take 1  tablet (40 mg total) by mouth at bedtime. 07/31/20   Cheryln Manly, NP  cyclobenzaprine (FLEXERIL) 10 MG tablet Take 10 mg by mouth 3 (three) times daily as needed for muscle spasms.    [provider]  isosorbide mononitrate (IMDUR) 30 MG 24 hr tablet Take 1 tablet (30 mg total) by mouth daily. 06/13/20 09/11/20  Arnoldo Lenis, MD  nitroGLYCERIN (NITROSTAT) 0.4 MG SL tablet Place 1 tablet (0.4 mg total) under the tongue every 5 (five) minutes as needed for chest pain. 06/27/20 09/25/20  Richardson Dopp T, PA-C  propranolol ER (INDERAL LA) 160 MG SR capsule Take 1 capsule (160 mg total) by mouth daily. 06/27/20   Richardson Dopp T, PA-C  sertraline (ZOLOFT) 100 MG tablet Take 100 mg by mouth daily.    [provider]  temazepam (RESTORIL) 30 MG capsule Take 30 mg by mouth at bedtime.    [provider]  Testosterone Cypionate 200 MG/ML SOLN Inject 200 mg as directed every 14 (fourteen) days.    [provider]  ticagrelor (BRILINTA) 90 MG TABS tablet Take 1 tablet (90 mg total) by mouth 2 (two) times daily. 07/31/20   Martinique, Peter M, MD  Venlafaxine HCl 225 MG TB24 Take 225 mg by mouth daily.  [provider]     Allergies:    Allergies  Allergen Reactions  . Calcium-Containing Compounds     Flares sarcoidosis     Social History:   Social History   Socioeconomic History  . Marital status: Married    Spouse name: Not on file  . Number of children: Not on file  . Years of education: Not on file  . Highest education level: Not on file  Occupational History  . Occupation: Retired    Comment: Previously in [employer] x 22 years  Tobacco Use  . Smoking status: Former Research scientist (life sciences)  . Smokeless tobacco: Current User    Types: Snuff  Vaping Use  . Vaping Use: Never used  Substance and Sexual Activity  . Alcohol use: Yes    Comment: rare   . Drug use: Yes    Types: Marijuana  . Sexual activity: Not on file  Other Topics Concern  . Not on file   Social History Narrative  . Not on file   Social Determinants of Health   Financial Resource Strain: Not on file  Food Insecurity: Not on file  Transportation Needs: Not on file  Physical Activity: Not on file  Stress: Not on file  Social Connections: Not on file  Intimate Partner Violence: Not on file    Family History:  The patient's family history is not on file.   The patient has no family status information on file.     ROS:  Please see the history of present illness.  All other ROS reviewed and negative.     Physical Exam/Data:   Vitals:   07/31/20 1930 07/31/20 1933 07/31/20 1945 07/31/20 1950  BP: (!) 151/102  (!) 146/92   Pulse: 69  76   Resp: (!) 23  19   Temp:    (!) 97.2 F (36.2 C)  TempSrc:    Oral  SpO2: 100%  97%   Weight:  109.8 kg    Height:  6' (1.829 m)     No intake or output data in the 24 hours ending 07/31/20 2018 Filed Weights   07/31/20 1933  Weight: 109.8 kg   Body mass index is 32.82 kg/m.  General:  Well nourished, well developed, male in no acute distress HEENT: normal Lymph: no adenopathy Neck:  JVD not elevated Endocrine:  No thryomegaly Vascular: No carotid bruits; 4/4 extremity pulses 2+ bilaterally  Cardiac:  normal S1, S2; RRR; no murmur, no rub or gallop  Lungs:  clear to auscultation bilaterally, no wheezing, rhonchi or rales  Abd: soft, nontender, no hepatomegaly  Ext: no edema, R radial cath site w/out hematoma Musculoskeletal:  No deformities, BUE and BLE strength normal and equal Skin: warm and dry  Neuro:  CNs 2-12 intact, no focal abnormalities noted Psych:  Normal affect    EKG:  The ECG was personally reviewed: Sinus rhythm, heart rate 74, no acute ischemic changes Telemetry: Sinus rhythm  Relevant CV Studies:  CATH: 07/31/2020  Prox RCA lesion is 25% stenosed.  RPDA lesion is 90% stenosed.  1st RPL lesion is 60% stenosed.  2nd RPL lesion is 60% stenosed.  Prox LAD to Mid LAD lesion is 40%  stenosed.  Dist LAD-1 lesion is 70% stenosed.  Dist LAD-2 lesion is 75% stenosed.  RPAV lesion is 50% stenosed.  1st Diag lesion is 99% stenosed.  2nd Diag lesion is 70% stenosed.  Ramus lesion is 99% stenosed.  1st Mrg lesion is 70% stenosed.  A drug-eluting stent was successfully placed using a [place] [id number].  Post intervention, there is a 0% residual stenosis.  Mid Cx to Dist Cx lesion is 99% stenosed.  A drug-eluting stent was successfully placed using a STENT RESOLUTE [id number].  Post intervention, there is a 0% residual stenosis.  The left ventricular systolic function is normal.  LV end diastolic pressure is normal.  The left ventricular ejection fraction is 55-65% by visual estimate.   1. 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 There was subtotal occlusion of the mid LCx which was a large vessel. There was also flow limiting stenosis in the first OM by RFR. 2. Normal LV function 3. Normal LVEDP 4. Successful PCI of the mid LCx with DES 5. Successful PCI of the first OM with DES  Plan: DAPT for one year. Will need to treat residual small vessel disease medically. Anticipate same day DC.  Intervention      Laboratory Data:  Chemistry Recent Labs  Lab 07/31/20 1952  NA 141  K 4.8  CL 106  GLUCOSE 126*  BUN 14  CREATININE 0.90    No results for input(s): PROT, ALBUMIN, AST, ALT, ALKPHOS, BILITOT in the last 168 hours. Hematology Recent Labs  Lab 07/31/20 1952  HGB 17.7*  HCT 52.0   Cardiac Enzymes  High Sensitivity Troponin:  No results for input(s): TROPONINIHS in the last 720 hours.   BNPNo results for input(s): BNP, PROBNP in the last 168 hours.  DDimer No results for input(s): DDIMER in the last 168 hours. Lipids: No results found for: CHOL, HDL, LDLCALC, LDLDIRECT, TRIG, CHOLHDL INR: No results found for: INR, PROTIME A1c: No results found for:  HGBA1C Thyroid: No results found for: TSH, T3TOTAL, T4TOTAL, THYROIDAB  Radiology/Studies:  CARDIAC CATHETERIZATION  Result Date: 07/31/2020  Prox RCA lesion is 25% stenosed.  RPDA lesion is 90% stenosed.  1st RPL lesion is 60% stenosed.  2nd RPL lesion is 60% stenosed.  Prox LAD to Mid LAD lesion is 40% stenosed.  Dist LAD-1 lesion is 70% stenosed.  Dist LAD-2 lesion is 75% stenosed.  RPAV lesion is 50% stenosed.  1st Diag lesion is 99% stenosed.  2nd Diag lesion is 70% stenosed.  Ramus lesion is 99% stenosed.  1st Mrg lesion is 70% stenosed.  A drug-eluting stent was successfully placed using a [place] [id number].  Post intervention, there is a 0% residual stenosis.  Mid Cx to Dist Cx lesion is 99% stenosed.  A drug-eluting stent was successfully placed using a STENT RESOLUTE [id number].  Post intervention, there is a 0% residual stenosis.  The left ventricular systolic function is normal.  LV end diastolic pressure is normal.  The left ventricular ejection fraction is 55-65% by visual estimate.  1. 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 There was subtotal occlusion of the mid LCx which was a large vessel. There was also flow limiting stenosis in the first OM by RFR. 2. Normal LV function 3. Normal LVEDP 4. Successful PCI of the mid LCx with DES 5. Successful PCI of the first OM with DES Plan: DAPT for one year. Will need to treat residual small vessel disease medically. Anticipate same day DC.   DG Chest Port 1 View  Result Date: 07/31/2020 CLINICAL DATA:  Weakness.  Recent coronary stent placement. EXAM: PORTABLE CHEST 1 VIEW COMPARISON:  None. FINDINGS: The heart size  and mediastinal contours are within normal limits. Both lungs are clear. The visualized skeletal structures are unremarkable. IMPRESSION: No active disease. Electronically Signed   By: Titus Dubin M.D.   On: 07/31/2020 19:47    Assessment  and Plan:   1.  Near syncope with associated bradycardia -His heart rate is within normal limits after dose of atropine -However, he still feels very drowsy and sleepy, like he cannot keep his eyes open -He answers questions appropriately and is oriented x3 -He has been on propranolol 120 mg daily for years -Admit overnight for observation, keep on telemetry. -Discuss with MD if the propanolol dose should perhaps be reduced or if this was most likely a vagal episode with no other evaluation indicated  2. CAD: -History of stents to the fax and OM today -Medical therapy for residual coronary artery disease -He did not have any chest pain with all of this, did have some left arm pain but this is resolved -Enzymes are pending -At this time, continue current therapy but no further work-up is indicated  Otherwise, continue home meds Principal Problem:   Near syncope     For questions or updates, please contact [facility] Please consult [email or web address] for contact info under Cardiology/STEMI.    Signed, Rosaria Ferries, PA-C  07/31/2020 8:18 PM    I have seen and examined the patient along with Rosaria Ferries, PA-C .  I have reviewed the chart, notes and new data.  I agree with PA/NP's note.  Key new complaints: He felt carsick on the drive home, followed by diaphoresis and almost complete loss of consciousness.  He promptly improved with atropine.  Only complaint at this time is fatigue. Key examination changes: Normal cardiovascular exam.  R radial artery access site appears healthy. Key new findings / data: Nonischemic normal ECG.  He is afebrile, but WBC 16k.  Component).  PLAN: His episode of near syncope is highly suggestive of vasovagal mechanism with both cardioinhibitory and vasodepressor components.  He may have had previous form fruste vasovagal presyncope manifesting as sudden diaphoresis, but the trigger is unclear.  Events were associated with chest pain and a treatment  to his cardiac event patient.  The episode this evening, did not associate chest pain.  He had a classical prodrome and his response to atropine was prompt.  Monitor overnight and repeat electrocardiogram and CBC .  No high risk features on tonight's tracing or cardiac enzymes.  Sanda Klein, MD, [place] [phone] 07/31/2020, 8:41 PM

## 2020-07-31 NOTE — Discharge Summary (Signed)
Discharge Summary for Same Day PCI   Patient ID: Henry Webb MRN: [id number]; DOB: [birth year]  Admit date: 07/31/2020 Discharge date: 07/31/2020  Primary Care Kirke Breach: Celene Squibb, MD  Primary Cardiologist: Carlyle Dolly, MD  Primary Electrophysiologist:  None   Discharge Diagnoses    Principal Problem:   Angina pectoris [facility]) Active Problems:   Sarcoidosis   HTN (hypertension)   Morbid obesity ([place])   Hypercholesterolemia    Diagnostic Studies/Procedures    Cardiac Catheterization 07/31/2020:   Prox RCA lesion is 25% stenosed.  RPDA lesion is 90% stenosed.  1st RPL lesion is 60% stenosed.  2nd RPL lesion is 60% stenosed.  Prox LAD to Mid LAD lesion is 40% stenosed.  Dist LAD-1 lesion is 70% stenosed.  Dist LAD-2 lesion is 75% stenosed.  RPAV lesion is 50% stenosed.  1st Diag lesion is 99% stenosed.  2nd Diag lesion is 70% stenosed.  Ramus lesion is 99% stenosed.  1st Mrg lesion is 70% stenosed.  A drug-eluting stent was successfully placed using a [place] [id number].  Post intervention, there is a 0% residual stenosis.  Mid Cx to Dist Cx lesion is 99% stenosed.  A drug-eluting stent was successfully placed using a STENT RESOLUTE [id number].  Post intervention, there is a 0% residual stenosis.  The left ventricular systolic function is normal.  LV end diastolic pressure is normal.  The left ventricular ejection fraction is 55-65% by visual estimate.   1. 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 There was subtotal occlusion of the mid LCx which was a large vessel. There was also flow limiting stenosis in the first OM by RFR. 2. Normal LV function 3. Normal LVEDP 4. Successful PCI of the mid LCx with DES 5. Successful PCI of the first OM with DES  Plan: DAPT for one year. Will need to treat residual small vessel disease medically. Anticipate same day DC.    Diagnostic Dominance: Right    Intervention     _____________   History of Present Illness     Henry Webb is a 50 y.o. male with PMH of palpitations, HTN, anxiety, depression, and obesity who was seen in the office on 07/25/20 for follow up for recent outpatient abnormal stress test. Blood pressures were elevated and BB was further increased. Results of myoview were discussed as intermediate risk with inferolateral infarct with mod peri-infarct. He was already on two anti-anginal agents and was still having symptoms. Recommendations were given for further evaluation with cardiac cath.    Hospital Course     The patient underwent cardiac cath as noted above with 3v CAD with significant involvement of the distal vessels and small side branches. Notable subtotal occlusion of the mLcx which was flow limiting lesion of the 1st OM. Successful PCI/DESx1 to mLCx and 1st OM. Plan to treat residual disease medically. Plan for DAPT with ASA/Brilinta for at least one year. The patient was seen by cardiac rehab while in short stay. There were no observed complications post cath. Radial cath site was re-evaluated prior to discharge and found to be stable without any complications. Instructions/precautions regarding cath site care were given prior to discharge.  Henry Webb was seen by Dr. Martinique and determined stable for discharge home. Follow up with our office has been arranged. Medications are listed below. Pertinent changes include addition of Brilinta.  _____________  Cath/PCI Registry Performance & Quality Measures: 1. Aspirin prescribed? - Yes 2. ADP Receptor  Inhibitor (Plavix/Clopidogrel, Brilinta/Ticagrelor or Effient/Prasugrel) prescribed (includes medically managed patients)? - Yes 3. High Intensity Statin (Lipitor 40-80mg  or Crestor 20-40mg ) prescribed? - Yes 4. For EF <40%, was ACEI/ARB prescribed? - Not Applicable (EF >/= 38%) 5. For EF <40%, Aldosterone Antagonist  (Spironolactone or Eplerenone) prescribed? - Not Applicable (EF >/= 46%) 6. Cardiac Rehab Phase II ordered (Included Medically managed Patients)? - Yes  _____________   Discharge Vitals Blood pressure (!) 143/102, pulse 65, temperature 98.5 F (36.9 C), temperature source Oral, resp. rate (!) 0, height 6' (1.829 m), weight 109.8 kg, SpO2 99 %.  Filed Weights   07/31/20 0847  Weight: 109.8 kg    Last Labs & Radiologic Studies    CBC No results for input(s): WBC, NEUTROABS, HGB, HCT, MCV, PLT in the last 72 hours. Basic Metabolic Panel No results for input(s): NA, K, CL, CO2, GLUCOSE, BUN, CREATININE, CALCIUM, MG, PHOS in the last 72 hours. Liver Function Tests No results for input(s): AST, ALT, ALKPHOS, BILITOT, PROT, ALBUMIN in the last 72 hours. No results for input(s): LIPASE, AMYLASE in the last 72 hours. High Sensitivity Troponin:   No results for input(s): TROPONINIHS in the last 720 hours.  BNP Invalid input(s): POCBNP D-Dimer No results for input(s): DDIMER in the last 72 hours. Hemoglobin A1C No results for input(s): HGBA1C in the last 72 hours. Fasting Lipid Panel No results for input(s): CHOL, HDL, LDLCALC, TRIG, CHOLHDL, LDLDIRECT in the last 72 hours. Thyroid Function Tests No results for input(s): TSH, T4TOTAL, T3FREE, THYROIDAB in the last 72 hours.  Invalid input(s): FREET3 _____________  CARDIAC CATHETERIZATION  Result Date: 07/31/2020  Prox RCA lesion is 25% stenosed.  RPDA lesion is 90% stenosed.  1st RPL lesion is 60% stenosed.  2nd RPL lesion is 60% stenosed.  Prox LAD to Mid LAD lesion is 40% stenosed.  Dist LAD-1 lesion is 70% stenosed.  Dist LAD-2 lesion is 75% stenosed.  RPAV lesion is 50% stenosed.  1st Diag lesion is 99% stenosed.  2nd Diag lesion is 70% stenosed.  Ramus lesion is 99% stenosed.  1st Mrg lesion is 70% stenosed.  A drug-eluting stent was successfully placed using a [place] [id number].  Post intervention, there is a  0% residual stenosis.  Mid Cx to Dist Cx lesion is 99% stenosed.  A drug-eluting stent was successfully placed using a STENT RESOLUTE [id number].  Post intervention, there is a 0% residual stenosis.  The left ventricular systolic function is normal.  LV end diastolic pressure is normal.  The left ventricular ejection fraction is 55-65% by visual estimate.  1. 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 There was subtotal occlusion of the mid LCx which was a large vessel. There was also flow limiting stenosis in the first OM by RFR. 2. Normal LV function 3. Normal LVEDP 4. Successful PCI of the mid LCx with DES 5. Successful PCI of the first OM with DES Plan: DAPT for one year. Will need to treat residual small vessel disease medically. Anticipate same day DC.    Disposition   Pt is being discharged home today in good condition.  Follow-up Plans & Appointments     Follow-up Information    Imogene Burn, PA-C Follow up on 08/14/2020.   Specialty: Cardiology Why: at 7:45am for your follow up appt Contact information: [place] [id number] [phone]  Discharge Instructions    Amb Referral to Cardiac Rehabilitation   Complete by: As directed    Diagnosis: Coronary Stents   After initial evaluation and assessments completed: Virtual Based Care may be provided alone or in conjunction with Phase 2 Cardiac Rehab based on patient barriers.: Yes       Discharge Medications   Allergies as of 07/31/2020      Reactions   Calcium-containing Compounds    Flares sarcoidosis       Medication List    TAKE these medications   aspirin EC 81 MG tablet Take 1 tablet (81 mg total) by mouth daily. Swallow whole.   atorvastatin 40 MG tablet Commonly known as: LIPITOR Take 1 tablet (40 mg total) by mouth at bedtime.   cyclobenzaprine 10 MG tablet Commonly known as: FLEXERIL Take 10 mg by mouth 3  (three) times daily as needed for muscle spasms.   isosorbide mononitrate 30 MG 24 hr tablet Commonly known as: IMDUR Take 1 tablet (30 mg total) by mouth daily.   nitroGLYCERIN 0.4 MG SL tablet Commonly known as: NITROSTAT Place 1 tablet (0.4 mg total) under the tongue every 5 (five) minutes as needed for chest pain.   propranolol ER 160 MG SR capsule Commonly known as: INDERAL LA Take 1 capsule (160 mg total) by mouth daily.   sertraline 100 MG tablet Commonly known as: ZOLOFT Take 100 mg by mouth daily.   temazepam 30 MG capsule Commonly known as: RESTORIL Take 30 mg by mouth at bedtime.   Testosterone Cypionate 200 MG/ML Soln Inject 200 mg as directed every 14 (fourteen) days.   ticagrelor 90 MG Tabs tablet Commonly known as: BRILINTA Take 1 tablet (90 mg total) by mouth 2 (two) times daily.   Venlafaxine HCl 225 MG Tb24 Take 225 mg by mouth daily.       Allergies Allergies  Allergen Reactions  . Calcium-Containing Compounds     Flares sarcoidosis     Outstanding Labs/Studies   N/a   Duration of Discharge Encounter   Greater than 30 minutes including physician time.  Signed, Reino Bellis, NP 07/31/2020, 3:26 PM

## 2020-07-31 NOTE — ED Provider Notes (Signed)
[facility] EMERGENCY DEPARTMENT Provider Note   CSN: [id number] Arrival date & time: 07/31/20  1919     History Chief Complaint  Patient presents with  . Weakness  . Post-op Problem    Janson Hubbert is a 50 y.o. male.  50 year old male with prior medical history as detailed below presents for evaluation.  Patient status post cardiac stenting by Dr. Martinique earlier today.  Patient apparently with 2 stents placed in the circumflex.  Patient was discharged to go home around 5 PM.  Approximately half an hour after leaving the hospital he had a syncopal event in his vehicle.  Patient reports that he felt woozy, lightheaded.  He then passed out.  It is unclear how long he was out for.  EMS reports that he was pale and diaphoretic on their initial evaluation.  His heart rate was in the 50s.  EMS administered 0.5 of atropine.  His heart rate improved into the 70s.  He was not hypotensive with this event.  Patient feels improved upon arrival to the ED.  He denies associated chest pain or shortness of breath.  The history is provided by the patient and medical records.  Loss of Consciousness Episode history:  Single Most recent episode:  Today Timing:  Rare Progression:  Resolved Chronicity:  New Witnessed: yes   Relieved by:  Nothing Worsened by:  Nothing      Past Medical History:  Diagnosis Date  . Anxiety   . Depression   . Migraine   . Morbid obesity ([place])   . Palpitations   . Sarcoidosis   . Tachycardia   . Testicular hypofunction     Patient Active Problem List   Diagnosis Date Noted  . Angina pectoris ([place]) 07/31/2020  . Sarcoidosis 07/31/2020  . HTN (hypertension) 07/31/2020  . Morbid obesity ([place]) 07/31/2020  . Hypercholesterolemia 07/31/2020  . Near syncope 07/31/2020    Past Surgical History:  Procedure Laterality Date  . Fusion    . LITHOTRIPSY         No family history on file.  Social History   Tobacco Use  . Smoking  status: Former Research scientist (life sciences)  . Smokeless tobacco: Current User    Types: Snuff  Vaping Use  . Vaping Use: Never used  Substance Use Topics  . Alcohol use: Yes    Comment: rare   . Drug use: Yes    Types: Marijuana    Home Medications Prior to Admission medications   Medication Sig Start Date End Date Taking? Authorizing Provider  aspirin EC 81 MG tablet Take 1 tablet (81 mg total) by mouth daily. Swallow whole. 06/27/20  Yes Weaver, Scott T, PA-C  atorvastatin (LIPITOR) 40 MG tablet Take 1 tablet (40 mg total) by mouth at bedtime. 07/31/20  Yes Reino Bellis B, NP  isosorbide mononitrate (IMDUR) 30 MG 24 hr tablet Take 1 tablet (30 mg total) by mouth daily. 06/13/20 09/11/20 Yes Branch, Alphonse Guild, MD  nitroGLYCERIN (NITROSTAT) 0.4 MG SL tablet Place 1 tablet (0.4 mg total) under the tongue every 5 (five) minutes as needed for chest pain. 06/27/20 09/25/20 Yes Weaver, Scott T, PA-C  propranolol ER (INDERAL LA) 160 MG SR capsule Take 1 capsule (160 mg total) by mouth daily. 06/27/20  Yes Weaver, Scott T, PA-C  sertraline (ZOLOFT) 100 MG tablet Take 100 mg by mouth daily.   Yes [provider]  temazepam (RESTORIL) 30 MG capsule Take 30 mg by mouth at bedtime.   Yes [provider]  Testosterone Cypionate 200 MG/ML SOLN Inject 200 mg as directed every 14 (fourteen) days.   Yes [provider]  ticagrelor (BRILINTA) 90 MG TABS tablet Take 1 tablet (90 mg total) by mouth 2 (two) times daily. 07/31/20  Yes Martinique, Donley Harland M, MD  Venlafaxine HCl 225 MG TB24 Take 225 mg by mouth daily.   Yes [provider]  cyclobenzaprine (FLEXERIL) 10 MG tablet Take 10 mg by mouth 3 (three) times daily as needed for muscle spasms.    [provider]    Allergies    Calcium-containing compounds  Review of Systems   Review of Systems  Cardiovascular: Positive for syncope.  All other systems reviewed and are negative.   Physical Exam Updated Vital Signs BP (!) 142/97   Pulse  85   Temp (!) 97.2 F (36.2 C) (Oral)   Resp (!) 25   Ht 6' (1.829 m)   Wt 109.8 kg   SpO2 96%   BMI 32.82 kg/m   Physical Exam Vitals and nursing note reviewed.  Constitutional:      General: He is not in acute distress.    Appearance: Normal appearance. He is well-developed and well-nourished.  HENT:     Head: Normocephalic and atraumatic.     Mouth/Throat:     Mouth: Oropharynx is clear and moist.  Eyes:     Extraocular Movements: EOM normal.     Conjunctiva/sclera: Conjunctivae normal.     Pupils: Pupils are equal, round, and reactive to light.  Cardiovascular:     Rate and Rhythm: Normal rate and regular rhythm.     Heart sounds: Normal heart sounds.  Pulmonary:     Effort: Pulmonary effort is normal. No respiratory distress.     Breath sounds: Normal breath sounds.  Abdominal:     General: There is no distension.     Palpations: Abdomen is soft.     Tenderness: There is no abdominal tenderness.  Musculoskeletal:        General: No deformity or edema. Normal range of motion.     Cervical back: Normal range of motion and neck supple.  Skin:    General: Skin is warm and dry.  Neurological:     Mental Status: He is alert and oriented to person, place, and time.  Psychiatric:        Mood and Affect: Mood and affect normal.     ED Results / Procedures / Treatments   Labs (all labs ordered are listed, but only abnormal results are displayed) Labs Reviewed  CBC WITH DIFFERENTIAL/PLATELET - Abnormal; Notable for the following components:      Result Value   WBC 16.4 (*)    RBC 5.82 (*)    Hemoglobin 17.4 (*)    HCT 52.8 (*)    Neutro Abs 13.0 (*)    Abs Immature Granulocytes 0.19 (*)    All other components within normal limits  I-STAT CHEM 8, ED - Abnormal; Notable for the following components:   Glucose, Bld 126 (*)    Hemoglobin 17.7 (*)    All other components within normal limits  RESP PANEL BY RT-PCR (FLU A&B, COVID) ARPGX2  COMPREHENSIVE METABOLIC  PANEL  COMPREHENSIVE METABOLIC PANEL  TSH  TROPONIN I (HIGH SENSITIVITY)    EKG EKG Interpretation  Date/Time:  Tuesday July 31 2020 19:29:43 EST Ventricular Rate:  74 PR Interval:    QRS Duration: 100 QT Interval:  405 QTC Calculation: 450 R Axis:  60 Text Interpretation: Normal sinus rhythm Confirmed by Dene Gentry [phone]) on 07/31/2020 7:37:24 PM   Radiology CARDIAC CATHETERIZATION  Result Date: 07/31/2020  Prox RCA lesion is 25% stenosed.  RPDA lesion is 90% stenosed.  1st RPL lesion is 60% stenosed.  2nd RPL lesion is 60% stenosed.  Prox LAD to Mid LAD lesion is 40% stenosed.  Dist LAD-1 lesion is 70% stenosed.  Dist LAD-2 lesion is 75% stenosed.  RPAV lesion is 50% stenosed.  1st Diag lesion is 99% stenosed.  2nd Diag lesion is 70% stenosed.  Ramus lesion is 99% stenosed.  1st Mrg lesion is 70% stenosed.  A drug-eluting stent was successfully placed using a [place] [id number].  Post intervention, there is a 0% residual stenosis.  Mid Cx to Dist Cx lesion is 99% stenosed.  A drug-eluting stent was successfully placed using a STENT RESOLUTE [id number].  Post intervention, there is a 0% residual stenosis.  The left ventricular systolic function is normal.  LV end diastolic pressure is normal.  The left ventricular ejection fraction is 55-65% by visual estimate.  1. 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 There was subtotal occlusion of the mid LCx which was a large vessel. There was also flow limiting stenosis in the first OM by RFR. 2. Normal LV function 3. Normal LVEDP 4. Successful PCI of the mid LCx with DES 5. Successful PCI of the first OM with DES Plan: DAPT for one year. Will need to treat residual small vessel disease medically. Anticipate same day DC.   DG Chest Port 1 View  Result Date: 07/31/2020 CLINICAL DATA:  Weakness.  Recent coronary stent placement. EXAM: PORTABLE CHEST 1  VIEW COMPARISON:  None. FINDINGS: The heart size and mediastinal contours are within normal limits. Both lungs are clear. The visualized skeletal structures are unremarkable. IMPRESSION: No active disease. Electronically Signed   By: Titus Dubin M.D.   On: 07/31/2020 19:47    Procedures Procedures   Medications Ordered in ED Medications  nitroGLYCERIN (NITROSTAT) SL tablet 0.4 mg (has no administration in time range)  acetaminophen (TYLENOL) tablet 650 mg (has no administration in time range)  ondansetron (ZOFRAN) injection 4 mg (has no administration in time range)  ALPRAZolam (XANAX) tablet 0.25 mg (has no administration in time range)  enoxaparin (LOVENOX) injection 40 mg (has no administration in time range)  sodium chloride flush (NS) 0.9 % injection 3 mL (has no administration in time range)  sodium chloride flush (NS) 0.9 % injection 3 mL (has no administration in time range)  0.9 %  sodium chloride infusion (has no administration in time range)  zolpidem (AMBIEN) tablet 5 mg (has no administration in time range)  aspirin EC tablet 81 mg (has no administration in time range)  atorvastatin (LIPITOR) tablet 40 mg (has no administration in time range)  cyclobenzaprine (FLEXERIL) tablet 10 mg (has no administration in time range)  isosorbide mononitrate (IMDUR) 24 hr tablet 30 mg (has no administration in time range)  sertraline (ZOLOFT) tablet 100 mg (has no administration in time range)  temazepam (RESTORIL) capsule 30 mg (has no administration in time range)  ticagrelor (BRILINTA) tablet 90 mg (has no administration in time range)  Venlafaxine HCl TB24 225 mg (has no administration in time range)    ED Course  I have reviewed the triage vital signs and the nursing notes.  Pertinent labs & imaging results that were available during my care of  the patient were reviewed by me and considered in my medical decision making (see chart for details).    MDM Rules/Calculators/A&P                           MDM  Screen complete  Dael Zehnder was evaluated in Emergency Department on 07/31/2020 for the symptoms described in the history of present illness. He was evaluated in the context of the global COVID-19 pandemic, which necessitated consideration that the patient might be at risk for infection with the SARS-CoV-2 virus that causes COVID-19. Institutional protocols and algorithms that pertain to the evaluation of patients at risk for COVID-19 are in a state of rapid change based on information released by regulatory bodies including the CDC and federal and state organizations. These policies and algorithms were followed during the patient's care in the ED.  Patient is presenting after syncopal event.  Patient received cardiac stents earlier this afternoon.  Patient is currently improved upon initial ED evaluation  Screening labs to be obtained.  Patient will be evaluated by cardiology with a likely observation admission. Final Clinical Impression(s) / ED Diagnoses Final diagnoses:  Syncope, unspecified syncope type    Rx / DC Orders ED Discharge Orders    None       Valarie Merino, MD 07/31/20 2046

## 2020-07-31 NOTE — Progress Notes (Signed)
1115-1208 Education completed with patient and wife who voiced understanding. Stressed importance of brilinta with stent. Reviewed NTG use, walking for exercise, heart healthy food choices,tobacco cessation (snuff), and CRP 2.  Referred to [facility]. Graylon Good RN BSN 07/31/2020 12:06 PM

## 2020-08-01 DIAGNOSIS — R55 Syncope and collapse: Secondary | ICD-10-CM | POA: Diagnosis not present

## 2020-08-01 LAB — TSH: TSH: 1.703 u[IU]/mL (ref 0.350–4.500)

## 2020-08-01 LAB — COMPREHENSIVE METABOLIC PANEL
ALT: 32 U/L (ref 0–44)
AST: 22 U/L (ref 15–41)
Albumin: 3.9 g/dL (ref 3.5–5.0)
Alkaline Phosphatase: 81 U/L (ref 38–126)
Anion gap: 11 (ref 5–15)
BUN: 11 mg/dL (ref 6–20)
CO2: 23 mmol/L (ref 22–32)
Calcium: 9.5 mg/dL (ref 8.9–10.3)
Chloride: 104 mmol/L (ref 98–111)
Creatinine, Ser: 0.88 mg/dL (ref 0.61–1.24)
GFR, Estimated: >60 mL/min (ref >60)
Glucose, Bld: 105 mg/dL — ABNORMAL HIGH (ref 70–99)
Potassium: 4.1 mmol/L (ref 3.5–5.1)
Sodium: 138 mmol/L (ref 135–145)
Total Bilirubin: 1.6 mg/dL — ABNORMAL HIGH (ref 0.3–1.2)
Total Protein: 6.7 g/dL (ref 6.5–8.1)

## 2020-08-01 LAB — CBC
HCT: 49.3 % (ref 39.0–52.0)
Hemoglobin: 16.9 g/dL (ref 13.0–17.0)
MCH: 30.5 pg (ref 26.0–34.0)
MCHC: 34.3 g/dL (ref 30.0–36.0)
MCV: 89 fL (ref 80.0–100.0)
Platelets: 347 10*3/uL (ref 150–400)
RBC: 5.54 MIL/uL (ref 4.22–5.81)
RDW: 13.5 % (ref 11.5–15.5)
WBC: 11 10*3/uL — ABNORMAL HIGH (ref 4.0–10.5)
nRBC: 0 % (ref 0.0–0.2)

## 2020-08-01 LAB — TROPONIN I (HIGH SENSITIVITY): Troponin I (High Sensitivity): 33 ng/L — ABNORMAL HIGH (ref <18)

## 2020-08-01 MED ORDER — PROPRANOLOL HCL ER 160 MG PO CP24
160.0000 mg | ORAL_CAPSULE | Freq: Every day | ORAL | Status: DC
  Filled 2020-08-01: qty 1

## 2020-08-01 MED ORDER — PROPRANOLOL HCL ER 80 MG PO CP24
160.0000 mg | ORAL_CAPSULE | Freq: Every day | ORAL | Status: DC
  Administered 2020-08-01: 160 mg via ORAL
  Filled 2020-08-01: qty 2

## 2020-08-01 NOTE — Progress Notes (Addendum)
Progress Note  Patient Name: Henry Webb Date of Encounter: 08/01/2020  [facility] Cardiologist: Carlyle Dolly, MD   Subjective   Doing well this morning. Feels much better, no episodes of chest pain.   Inpatient Medications    Scheduled Meds: . aspirin EC  81 mg Oral Daily  . atorvastatin  40 mg Oral QHS  . enoxaparin (LOVENOX) injection  40 mg Subcutaneous Q24H  . isosorbide mononitrate  30 mg Oral Daily  . propranolol ER  160 mg Oral Daily  . sertraline  100 mg Oral Daily  . sodium chloride flush  3 mL Intravenous Q12H  . temazepam  30 mg Oral QHS  . ticagrelor  90 mg Oral BID  . venlafaxine XR  225 mg Oral Daily   Continuous Infusions: . sodium chloride     PRN Meds: sodium chloride, acetaminophen, ALPRAZolam, cyclobenzaprine, nitroGLYCERIN, ondansetron (ZOFRAN) IV, sodium chloride flush, zolpidem   Vital Signs    Vitals:   08/01/20 0600 08/01/20 0700 08/01/20 0750 08/01/20 0800  BP: (!) 147/84 (!) 166/109 (!) 178/104 (!) 162/99  Pulse: 71 91 77 71  Resp: 12 19 17  (!) 23  Temp:      TempSrc:      SpO2: 92% 96% 95% 94%  Weight:      Height:       No intake or output data in the 24 hours ending 08/01/20 0817 Last 3 Weights 07/31/2020 07/31/2020 07/25/2020  Weight (lbs) 242 lb 242 lb 242 lb 3.2 oz  Weight (kg) 109.77 kg 109.77 kg 109.861 kg      Telemetry    SR-->ST - Personally Reviewed  ECG    SR with baseline artifact, 78bpm- Personally Reviewed  Physical Exam   GEN: No acute distress.   Neck: No JVD Cardiac: RRR, no murmurs, rubs, or gallops.  Respiratory: Clear to auscultation bilaterally. GI: Soft, nontender, non-distended  MS: No edema; No deformity. Right radial cath site stable. Neuro:  Nonfocal  Psych: Normal affect   Labs    High Sensitivity Troponin:   Recent Labs  Lab 07/31/20 1937 07/31/20 2230  TROPONINIHS 24* 33*      Chemistry Recent Labs  Lab 07/31/20 1937 07/31/20 1952 08/01/20 0415  NA 137 141 138  K  5.2* 4.8 4.1  CL 105 106 104  CO2 23  --  23  GLUCOSE 133* 126* 105*  BUN 11 14 11   CREATININE 1.00 0.90 0.88  CALCIUM 9.3  --  9.5  PROT 7.3  --  6.7  ALBUMIN 4.2  --  3.9  AST 26  --  22  ALT 35  --  32  ALKPHOS 81  --  81  BILITOT 1.5*  --  1.6*  GFRNONAA >60  --  >60  ANIONGAP 9  --  11     Hematology Recent Labs  Lab 07/31/20 1937 07/31/20 1952 08/01/20 0415  WBC 16.4*  --  11.0*  RBC 5.82*  --  5.54  HGB 17.4* 17.7* 16.9  HCT 52.8* 52.0 49.3  MCV 90.7  --  89.0  MCH 29.9  --  30.5  MCHC 33.0  --  34.3  RDW 13.2  --  13.5  PLT 377  --  347    BNPNo results for input(s): BNP, PROBNP in the last 168 hours.   DDimer No results for input(s): DDIMER in the last 168 hours.   Radiology    CARDIAC CATHETERIZATION  Result Date: 07/31/2020  Prox RCA lesion  is 25% stenosed.  RPDA lesion is 90% stenosed.  1st RPL lesion is 60% stenosed.  2nd RPL lesion is 60% stenosed.  Prox LAD to Mid LAD lesion is 40% stenosed.  Dist LAD-1 lesion is 70% stenosed.  Dist LAD-2 lesion is 75% stenosed.  RPAV lesion is 50% stenosed.  1st Diag lesion is 99% stenosed.  2nd Diag lesion is 70% stenosed.  Ramus lesion is 99% stenosed.  1st Mrg lesion is 70% stenosed.  A drug-eluting stent was successfully placed using a [place] [id number].  Post intervention, there is a 0% residual stenosis.  Mid Cx to Dist Cx lesion is 99% stenosed.  A drug-eluting stent was successfully placed using a STENT RESOLUTE [id number].  Post intervention, there is a 0% residual stenosis.  The left ventricular systolic function is normal.  LV end diastolic pressure is normal.  The left ventricular ejection fraction is 55-65% by visual estimate.  1. 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 There was subtotal occlusion of the mid LCx which was a large vessel. There was also flow limiting stenosis in the first OM by RFR. 2. Normal LV  function 3. Normal LVEDP 4. Successful PCI of the mid LCx with DES 5. Successful PCI of the first OM with DES Plan: DAPT for one year. Will need to treat residual small vessel disease medically. Anticipate same day DC.   DG Chest Port 1 View  Result Date: 07/31/2020 CLINICAL DATA:  Weakness.  Recent coronary stent placement. EXAM: PORTABLE CHEST 1 VIEW COMPARISON:  None. FINDINGS: The heart size and mediastinal contours are within normal limits. Both lungs are clear. The visualized skeletal structures are unremarkable. IMPRESSION: No active disease. Electronically Signed   By: Titus Dubin M.D.   On: 07/31/2020 19:47    Cardiac Studies   CATH: 07/31/2020  Prox RCA lesion is 25% stenosed.  RPDA lesion is 90% stenosed.  1st RPL lesion is 60% stenosed.  2nd RPL lesion is 60% stenosed.  Prox LAD to Mid LAD lesion is 40% stenosed.  Dist LAD-1 lesion is 70% stenosed.  Dist LAD-2 lesion is 75% stenosed.  RPAV lesion is 50% stenosed.  1st Diag lesion is 99% stenosed.  2nd Diag lesion is 70% stenosed.  Ramus lesion is 99% stenosed.  1st Mrg lesion is 70% stenosed.  A drug-eluting stent was successfully placed using a [place] [id number].  Post intervention, there is a 0% residual stenosis.  Mid Cx to Dist Cx lesion is 99% stenosed.  A drug-eluting stent was successfully placed using a STENT RESOLUTE [id number].  Post intervention, there is a 0% residual stenosis.  The left ventricular systolic function is normal.  LV end diastolic pressure is normal.  The left ventricular ejection fraction is 55-65% by visual estimate.  1. 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 There was subtotal occlusion of the mid LCx which was a large vessel. There was also flow limiting stenosis in the first OM by RFR. 2. Normal LV function 3. Normal LVEDP 4. Successful PCI of the mid LCx with DES 5. Successful PCI of the  first OM with DES  Plan: DAPT for one year. Will need to treat residual small vessel disease medically. Anticipate same day DC.        Intervention       Patient Profile     50 y.o. male with PMH of palpitations,  HLD and CAD with PCI/DESx2 3/8 who presented back to the ED with near syncope.   Assessment & Plan    1. Near syncope with associated bradycardia: HR was reported in the 40s on EMS arrival which promptly responded to atropine but was still drowsy in the ED. Overnight monitoring with stable rhythm, no high degree AVB noted. Felt to be vasovagal mechanism.   2. CAD: s/p PCI/DESx2 (mLCx and 1st OM). Placed on DAPT with ASA/Brilinta. No associated chest pain with event yesterday evening. hsTn with low flat trend.  -- continue DAPT  3. HLD: continue statin  4. HTN: has been on propranolol 160mg  daily for many years and tolerated without issues.  -- blood pressure and HR are elevated this morning. Will resume home dose  MD to see for possible discharge later this morning.  For questions or updates, please contact [place] Please consult [email or web address] for contact info under        Signed, Reino Bellis, NP  08/01/2020, 8:17 AM    Patient seen and examined. Agree with assessment and plan. Feels well, no dizziness or chest pain, BP elevated this am, but has just received his morning meds. BP at home is typically in the 975O systolic by his report. Propanolol dose had recently been doubled.  If BP continues to be elevated as outpatient consider adding amlodipine for BP/CAD. R radial site stable.  OK for DC today.   Troy Sine, MD, [facility] 08/01/2020 10:29 AM

## 2020-08-01 NOTE — ED Notes (Signed)
Patient verbalizes understanding of discharge instructions. Opportunity for questioning and answers were provided. Armband removed by staff, pt discharged from ED.  

## 2020-08-01 NOTE — Discharge Summary (Signed)
Discharge Summary    Patient ID: Henry Webb MRN: [id number]; DOB: [birth year]  Admit date: 07/31/2020 Discharge date: 08/01/2020  PCP:  Celene Squibb, MD   [facility]  Cardiologist:  Carlyle Dolly, MD  Advanced Practice Provider:  No care team member to display Electrophysiologist:  None   Discharge Diagnoses    Principal Problem:   Near syncope  Diagnostic Studies/Procedures    N/a  _____________   History of Present Illness     Henry Webb is a 50 y.o. male with PMH of palpitations, HTN, anxiety, depression, and obesity who was seen in the office on 07/25/20 for follow up for recent outpatient abnormal stress test. Blood pressures were elevated and BB was further increased. Results of myoview were discussed as intermediate risk with inferolateral infarct with mod peri-infarct. He was already on two anti-anginal agents and was still having symptoms. Recommendations were given for further evaluation with cardiac cath.   The patient underwent cardiac cath as noted above with 3v CAD with significant involvement of the distal vessels and small side branches. Notable subtotal occlusion of the mLcx which was flow limiting lesion of the 1st OM. Successful PCI/DESx1 to mLCx and 1st OM. Plan to treat residual disease medically. Plan for DAPT with ASA/Brilinta for at least one year. The patient was seen by cardiac rehab while in short stay. There were no observed complications post cath. Radial cath site was re-evaluated prior to discharge and found to be stable without any complications. Instructions/precautions regarding cath site care were given prior to discharge.  Henry Webb was on his way home from the hospital when he had sudden onset of nausea followed by significant diaphoresis and left arm pain. He could not keep his eyes open and his wife was having trouble waking him up.  She pulled over to the side of the road, and called 911.  On EMS arrival, his  heart rate was in the 40s and he was given atropine. The atropine improved his heart rate and the diaphoresis improved as well.  In the ER, he felt very tired and sleepy, but no other symptoms.  He stated he has had episodes of diaphoresis before, but not in relation to any blood draws or other stimuli that he can remember.  At no time during all of this did he have any chest pain.  He was not short of breath.   Given symptoms he was admitted overnight for observation.  Hospital Course     1. Near syncope with associated bradycardia: HR was reported in the 40s on EMS arrival which promptly responded to atropine but was still drowsy in the ED. Overnight monitoring with stable rhythm, no high degree AVB noted. Felt to be vasovagal mechanism.   2. CAD: s/p PCI/DESx2 (mLCx and 1st OM). Placed on DAPT with ASA/Brilinta. No associated chest pain with event prior to admission. hsTn with low flat trend.  -- continue DAPT  3. HLD: continue statin  4. HTN: has been on propranolol 160mg  daily for many years and tolerated without issues.  -- blood pressure and HR are elevated the following morning and he was resumed on home medications.      _____________  Discharge Vitals Blood pressure (!) 168/96, pulse 72, temperature 98.2 F (36.8 C), temperature source Oral, resp. rate (!) 22, height 6' (1.829 m), weight 109.8 kg, SpO2 94 %.  Filed Weights   07/31/20 1933  Weight: 109.8 kg    Labs & Radiologic Studies  CBC Recent Labs    07/31/20 1937 07/31/20 1952 08/01/20 0415  WBC 16.4*  --  11.0*  NEUTROABS 13.0*  --   --   HGB 17.4* 17.7* 16.9  HCT 52.8* 52.0 49.3  MCV 90.7  --  89.0  PLT 377  --  032   Basic Metabolic Panel Recent Labs    07/31/20 1937 07/31/20 1952 08/01/20 0415  NA 137 141 138  K 5.2* 4.8 4.1  CL 105 106 104  CO2 23  --  23  GLUCOSE 133* 126* 105*  BUN 11 14 11   CREATININE 1.00 0.90 0.88  CALCIUM 9.3  --  9.5   Liver Function Tests Recent Labs     07/31/20 1937 08/01/20 0415  AST 26 22  ALT 35 32  ALKPHOS 81 81  BILITOT 1.5* 1.6*  PROT 7.3 6.7  ALBUMIN 4.2 3.9   No results for input(s): LIPASE, AMYLASE in the last 72 hours. High Sensitivity Troponin:   Recent Labs  Lab 07/31/20 1937 07/31/20 2230  TROPONINIHS 24* 33*    BNP Invalid input(s): POCBNP D-Dimer No results for input(s): DDIMER in the last 72 hours. Hemoglobin A1C No results for input(s): HGBA1C in the last 72 hours. Fasting Lipid Panel No results for input(s): CHOL, HDL, LDLCALC, TRIG, CHOLHDL, LDLDIRECT in the last 72 hours. Thyroid Function Tests Recent Labs    07/31/20 2230  TSH 1.703   _____________  CARDIAC CATHETERIZATION  Result Date: 07/31/2020  Prox RCA lesion is 25% stenosed.  RPDA lesion is 90% stenosed.  1st RPL lesion is 60% stenosed.  2nd RPL lesion is 60% stenosed.  Prox LAD to Mid LAD lesion is 40% stenosed.  Dist LAD-1 lesion is 70% stenosed.  Dist LAD-2 lesion is 75% stenosed.  RPAV lesion is 50% stenosed.  1st Diag lesion is 99% stenosed.  2nd Diag lesion is 70% stenosed.  Ramus lesion is 99% stenosed.  1st Mrg lesion is 70% stenosed.  A drug-eluting stent was successfully placed using a [place] [id number].  Post intervention, there is a 0% residual stenosis.  Mid Cx to Dist Cx lesion is 99% stenosed.  A drug-eluting stent was successfully placed using a STENT RESOLUTE [id number].  Post intervention, there is a 0% residual stenosis.  The left ventricular systolic function is normal.  LV end diastolic pressure is normal.  The left ventricular ejection fraction is 55-65% by visual estimate.  1. 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 There was subtotal occlusion of the mid LCx which was a large vessel. There was also flow limiting stenosis in the first OM by RFR. 2. Normal LV function 3. Normal LVEDP 4. Successful PCI of the mid LCx with DES 5.  Successful PCI of the first OM with DES Plan: DAPT for one year. Will need to treat residual small vessel disease medically. Anticipate same day DC.   DG Chest Port 1 View  Result Date: 07/31/2020 CLINICAL DATA:  Weakness.  Recent coronary stent placement. EXAM: PORTABLE CHEST 1 VIEW COMPARISON:  None. FINDINGS: The heart size and mediastinal contours are within normal limits. Both lungs are clear. The visualized skeletal structures are unremarkable. IMPRESSION: No active disease. Electronically Signed   By: Titus Dubin M.D.   On: 07/31/2020 19:47   Disposition   Pt is being discharged home today in good condition.  Follow-up Plans & Appointments     Follow-up Information    Imogene Burn,  PA-C Follow up on 08/14/2020.   Specialty: Cardiology Why: at 7:45am for your follow up appt Contact information: [address] [place] [address] [phone]              Discharge Instructions    Call MD for:  difficulty breathing, headache or visual disturbances   Complete by: As directed    Call MD for:  persistant dizziness or light-headedness   Complete by: As directed    Call MD for:  redness, tenderness, or signs of infection (pain, swelling, redness, odor or green/yellow discharge around incision site)   Complete by: As directed    Diet - low sodium heart healthy   Complete by: As directed    Increase activity slowly   Complete by: As directed       Discharge Medications   Allergies as of 08/01/2020      Reactions   Calcium-containing Compounds    Flares sarcoidosis       Medication List    TAKE these medications   aspirin EC 81 MG tablet Take 1 tablet (81 mg total) by mouth daily. Swallow whole.   atorvastatin 40 MG tablet Commonly known as: LIPITOR Take 1 tablet (40 mg total) by mouth at bedtime.   cyclobenzaprine 10 MG tablet Commonly known as: FLEXERIL Take 10 mg by mouth 3 (three) times daily as needed for muscle spasms.   isosorbide mononitrate 30  MG 24 hr tablet Commonly known as: IMDUR Take 1 tablet (30 mg total) by mouth daily.   nitroGLYCERIN 0.4 MG SL tablet Commonly known as: NITROSTAT Place 1 tablet (0.4 mg total) under the tongue every 5 (five) minutes as needed for chest pain.   propranolol ER 160 MG SR capsule Commonly known as: INDERAL LA Take 1 capsule (160 mg total) by mouth daily.   sertraline 100 MG tablet Commonly known as: ZOLOFT Take 100 mg by mouth daily.   temazepam 30 MG capsule Commonly known as: RESTORIL Take 30 mg by mouth at bedtime.   Testosterone Cypionate 200 MG/ML Soln Inject 200 mg as directed every 14 (fourteen) days.   ticagrelor 90 MG Tabs tablet Commonly known as: BRILINTA Take 1 tablet (90 mg total) by mouth 2 (two) times daily.   Venlafaxine HCl 225 MG Tb24 Take 225 mg by mouth daily.          Outstanding Labs/Studies   N/a   Duration of Discharge Encounter   Greater than 30 minutes including physician time.  Signed, Reino Bellis, NP 08/01/2020, 11:41 AM

## 2020-08-02 ENCOUNTER — Telehealth: Payer: Self-pay

## 2020-08-02 NOTE — Telephone Encounter (Signed)
Patient contacted and verbalized understanding.

## 2020-08-02 NOTE — Telephone Encounter (Signed)
-----   Message from Liliane Shi, PA-C sent at 07/31/2020  8:37 AM EST ----- Hgb, creatinine, ALT normal.  K+ mildly elevated.  LDL above goal. PLAN:  -DC atorvastatin -Start rosuvastatin 40 mg daily -Fasting lipids, LFTs 3 months (order under Dr. Harl Bowie) -Limit dietary potassium, check follow-up BMET at next office visit Richardson Dopp, PA-C    07/31/2020 8:33 AM

## 2020-08-07 NOTE — Progress Notes (Signed)
Cardiology Office Note    Date:  08/14/2020   ID:  Henry, Webb [birth year], MRN [id number]   PCP:  Celene Squibb, MD   [facility]  Cardiologist:  Carlyle Dolly, MD  Advanced Practice Provider:  No care team member to display Electrophysiologist:  None   [phone]   Chief Complaint  Patient presents with  . Follow-up    History of Present Illness:  Henry Webb is a 50 y.o. male with history of hypertension, palpitations, anxiety depression, obesity.  Myoview with intermediate risk with inferior lateral infarct and moderate peri-infarct ischemia.  Cardiac catheterization done 07/31/2020 and underwent DES x1 to the mid circumflex and OM1 with residual disease to be treated medically.  Patient was on his way home from the hospital and he had sudden onset of nausea followed by diaphoresis left arm pain and near syncope.  Heart rate was in the 40s and was given atropine by EMS with improvement of his symptoms.  He was monitored overnight and stable rhythm no high degree AV block.  Felt his episode was vasovagal.  Troponin with flat low trend.  No medications changes made.  Patient comes in for f/u. Complains of cramping in his calves with activity worse in the evening. Has had a couple episodes with elevated BP. Was moving around the house and became anxious and sweaty. Usually associated with stress and anxiety. Denies chest pain, dyspnea, dizziness or presyncope. Walking 1/2 mile twice daily. 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 Smokes 2 ounces marijuana monthly. Says he smokes nonstop all day for head pain from chemical exposure in [employer].    Past Medical History:  Diagnosis Date  . Anxiety   . Depression   . Migraine   . Morbid obesity ([place])   . Palpitations   . Sarcoidosis   . Tachycardia   . Testicular hypofunction     Past Surgical History:  Procedure Laterality Date  . CORONARY  STENT INTERVENTION N/A 07/31/2020   Procedure: CORONARY STENT INTERVENTION;  Surgeon: Martinique, Peter M, MD;  Location: [facility];  Service: Cardiovascular;  Laterality: N/A;  . Fusion    . INTRAVASCULAR PRESSURE WIRE/FFR STUDY N/A 07/31/2020   Procedure: INTRAVASCULAR PRESSURE WIRE/FFR STUDY;  Surgeon: Martinique, Peter M, MD;  Location: [facility];  Service: Cardiovascular;  Laterality: N/A;  . LEFT HEART CATH AND CORONARY ANGIOGRAPHY N/A 07/31/2020   Procedure: LEFT HEART CATH AND CORONARY ANGIOGRAPHY;  Surgeon: Martinique, Peter M, MD;  Location: [facility];  Service: Cardiovascular;  Laterality: N/A;  . LITHOTRIPSY      Current Medications: Current Meds  Medication Sig  . aspirin EC 81 MG tablet Take 1 tablet (81 mg total) by mouth daily. Swallow whole.  . cyclobenzaprine (FLEXERIL) 10 MG tablet Take 10 mg by mouth 3 (three) times daily as needed for muscle spasms.  . isosorbide mononitrate (IMDUR) 30 MG 24 hr tablet Take 1 tablet (30 mg total) by mouth daily.  . nitroGLYCERIN (NITROSTAT) 0.4 MG SL tablet Place 1 tablet (0.4 mg total) under the tongue every 5 (five) minutes as needed for chest pain.  . pantoprazole (PROTONIX) 40 MG tablet Take 1 tablet (40 mg total) by mouth daily before breakfast.  . propranolol ER (INDERAL LA) 160 MG SR capsule Take 1 capsule (160 mg total) by mouth daily.  . rosuvastatin (CRESTOR) 40 MG tablet Take 40 mg by  mouth daily.  . sertraline (ZOLOFT) 100 MG tablet Take 100 mg by mouth daily.  . temazepam (RESTORIL) 30 MG capsule Take 30 mg by mouth at bedtime.  . Testosterone Cypionate 200 MG/ML SOLN Inject 200 mg as directed every 14 (fourteen) days.  . ticagrelor (BRILINTA) 90 MG TABS tablet Take 1 tablet (90 mg total) by mouth 2 (two) times daily.  . Venlafaxine HCl 225 MG TB24 Take 225 mg by mouth daily.     Allergies:   Calcium-containing compounds   Social History   Socioeconomic History  . Marital status: Married    Spouse name: Not  on file  . Number of children: Not on file  . Years of education: Not on file  . Highest education level: Not on file  Occupational History  . Occupation: Retired    Comment: Previously in [employer] x 22 years  Tobacco Use  . Smoking status: Former Research scientist (life sciences)  . Smokeless tobacco: Current User    Types: Snuff  Vaping Use  . Vaping Use: Never used  Substance and Sexual Activity  . Alcohol use: Yes    Comment: rare   . Drug use: Yes    Types: Marijuana  . Sexual activity: Not on file  Other Topics Concern  . Not on file  Social History Narrative  . Not on file   Social Determinants of Health   Financial Resource Strain: Not on file  Food Insecurity: Not on file  Transportation Needs: Not on file  Physical Activity: Not on file  Stress: Not on file  Social Connections: Not on file     Family History:  The patient's family history includes Colon cancer (age of onset: 48) in his cousin; Ovarian cancer in his mother.   ROS:   Please see the history of present illness.    ROS All other systems reviewed and are negative.   PHYSICAL EXAM:   VS:  BP 124/84   Pulse (!) 103   Ht 6' (1.829 m)   Wt 234 lb 3.2 oz (106.2 kg)   SpO2 99%   BMI 31.76 kg/m   Physical Exam  GEN: Well nourished, well developed, in no acute distress  Neck: no JVD, carotid bruits, or masses Cardiac:RRR; no murmurs, rubs, or gallops  Respiratory:  clear to auscultation bilaterally, normal work of breathing GI: soft, nontender, nondistended, + BS Ext: without cyanosis, clubbing, or edema, Good distal pulses bilaterally Neuro:  Alert and Oriented x 3 Psych: euthymic mood, full affect  Wt Readings from Last 3 Encounters:  08/14/20 234 lb 3.2 oz (106.2 kg)  08/13/20 235 lb 8 oz (106.8 kg)  07/31/20 242 lb (109.8 kg)      Studies/Labs Reviewed:   EKG:  EKG is not ordered today.    Recent Labs: 07/31/2020: TSH 1.703 08/01/2020: ALT 32; BUN 11; Creatinine, Ser 0.88; Hemoglobin 16.9; Platelets 347;  Potassium 4.1; Sodium 138   Lipid Panel No results found for: CHOL, TRIG, HDL, CHOLHDL, VLDL, LDLCALC, LDLDIRECT  Additional studies/ records that were reviewed today include:     Cardiac Catheterization 07/31/2020:    Prox RCA lesion is 25% stenosed.  RPDA lesion is 90% stenosed.  1st RPL lesion is 60% stenosed.  2nd RPL lesion is 60% stenosed.  Prox LAD to Mid LAD lesion is 40% stenosed.  Dist LAD-1 lesion is 70% stenosed.  Dist LAD-2 lesion is 75% stenosed.  RPAV lesion is 50% stenosed.  1st Diag lesion is 99% stenosed.  2nd Diag  lesion is 70% stenosed.  Ramus lesion is 99% stenosed.  1st Mrg lesion is 70% stenosed.  A drug-eluting stent was successfully placed using a [place] [id number].  Post intervention, there is a 0% residual stenosis.  Mid Cx to Dist Cx lesion is 99% stenosed.  A drug-eluting stent was successfully placed using a STENT RESOLUTE [id number].  Post intervention, there is a 0% residual stenosis.  The left ventricular systolic function is normal.  LV end diastolic pressure is normal.  The left ventricular ejection fraction is 55-65% by visual estimate.   1. 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 There was subtotal occlusion of the mid LCx which was a large vessel. There was also flow limiting stenosis in the first OM by RFR. 2. Normal LV function 3. Normal LVEDP 4. Successful PCI of the mid LCx with DES 5. Successful PCI of the first OM with DES   Plan: DAPT for one year. Will need to treat residual small vessel disease medically. Anticipate same day DC.    Risk Assessment/Calculations:         ASSESSMENT:    1. Coronary artery disease involving native coronary artery of native heart without angina pectoris   2. Near syncope   3. Essential hypertension   4. Palpitations   5. Mixed hyperlipidemia      PLAN:  In order of problems listed  above:  CAD -cardiac catheterization done 07/31/2020 and underwent DES x1 to the mid circumflex and OM1 with residual disease to be treated medically.  DAPT with aspirin and Brilinta for at least 1 year  Near syncope after discharge home from the hospital felt to be vasovagal.  Monitored overnight and no arrhythmias noted.no further episodes  Hypertension BP normal today. Goes up on occasion when a lot of anxiety.  Palpitations managed with Inderal  HLD on Crestor-was told recent labs were good at PCP. Will request copy  Leg cramps described as "charlie horse". K was high at PCP but he ate 6 bananas the day before it was checked. He's had these before. Excellent pulses. Will not order any further testing at this time.   Shared Decision Making/Informed Consent        Medication Adjustments/Labs and Tests Ordered: Current medicines are reviewed at length with the patient today.  Concerns regarding medicines are outlined above.  Medication changes, Labs and Tests ordered today are listed in the Patient Instructions below. Patient Instructions  Medication Instructions:  Your physician recommends that you continue on your current medications as directed. Please refer to the Current Medication list given to you today.  *If you need a refill on your cardiac medications before your next appointment, please call your pharmacy*   Lab Work: None today If you have labs (blood work) drawn today and your tests are completely normal, you will receive your results only by: Marland Kitchen MyChart Message (if you have MyChart) OR . A paper copy in the mail If you have any lab test that is abnormal or we need to change your treatment, we will call you to review the results.   Testing/Procedures: None today   Follow-Up: At [facility], you and your health needs are our priority.  As part of our continuing mission to provide you with exceptional heart care, we have created designated Provider Care Teams.   These Care Teams include your primary Cardiologist (physician) and Advanced Practice Providers (APPs -  Physician Assistants and Nurse  Practitioners) who all work together to provide you with the care you need, when you need it.  We recommend signing up for the patient portal called "MyChart".  Sign up information is provided on this After Visit Summary.  MyChart is used to connect with patients for Virtual Visits (Telemedicine).  Patients are able to view lab/test results, encounter notes, upcoming appointments, etc.  Non-urgent messages can be sent to your provider as well.   To learn more about what you can do with MyChart, go to [email or web address].    Your next appointment:   3 month(s)  The format for your next appointment:   In Person  Provider:   Carlyle Dolly, MD   Other Instructions None      Thank you for choosing [facility] !            Sumner Boast, PA-C  08/14/2020 8:12 AM    [facility] [address], [place], [place]  [address] Phone: [phone]; Fax: [phone]

## 2020-08-13 ENCOUNTER — Other Ambulatory Visit: Payer: Self-pay

## 2020-08-13 ENCOUNTER — Ambulatory Visit (INDEPENDENT_AMBULATORY_CARE_PROVIDER_SITE_OTHER): Admitting: Gastroenterology

## 2020-08-13 ENCOUNTER — Encounter: Payer: Self-pay | Admitting: Gastroenterology

## 2020-08-13 DIAGNOSIS — K649 Unspecified hemorrhoids: Secondary | ICD-10-CM | POA: Diagnosis not present

## 2020-08-13 DIAGNOSIS — R1010 Upper abdominal pain, unspecified: Secondary | ICD-10-CM

## 2020-08-13 DIAGNOSIS — K219 Gastro-esophageal reflux disease without esophagitis: Secondary | ICD-10-CM | POA: Diagnosis not present

## 2020-08-13 DIAGNOSIS — R109 Unspecified abdominal pain: Secondary | ICD-10-CM | POA: Insufficient documentation

## 2020-08-13 MED ORDER — PANTOPRAZOLE SODIUM 40 MG PO TBEC: 40.0000 mg | DELAYED_RELEASE_TABLET | Freq: Every day | ORAL | 0 refills | Status: DC

## 2020-08-13 NOTE — Progress Notes (Signed)
Primary Care Physician:  Celene Squibb, MD  Primary Gastroenterologist:  Garfield Cornea, MD   Chief Complaint  Patient presents with  . Chest Pain    Better now, though pain was indigestion but ending up having heart attack    HPI:  Henry Webb is a 50 y.o. male here the request of Dr. Nevada Crane for further evaluation of possible ulcer.  Past medical history significant for hypertension, anxiety/depression, obesity, CAD.   Patient states he saw PCP several months ago for what he thought was terrible heartburn or ulcer.  He had been taking aspirin powders on a regular basis, 6 or 7 times a day.  He has a prior history of remote bleeding ulcer back in the 90s, required EGD at that time.  Patient also has a long history of using snuff.  States he used a ton of Tums try to control symptoms.  Ultimately he was seen by cardiology.  He had a Myoview in January with intermediate risk with anterior lateral infarct with moderate peri-infarct ischemia.  A couple of weeks ago he underwent his first cardiac catheterization, noted to have three-vessel disease, required 2 stents placed.  Plans for DAPT with aspirin/Brilinta for at least 1 year.  Patient states that what he felt was heartburn has resolved post stenting.  He denies dysphagia, abdominal pain, melena, blood in the stools.  Has had some intermittent heartburn.  Patient states he used to weigh about 300 pounds, he was able to drop down to around 200 but over the past couple of years got back to 260.  In the recent months he lost back down to 235.  He has made a lot of dietary changes.  Patient has never had a colonoscopy.  He states he has a history of hemorrhoids noted externally, have had flares with pain and bleeding which she was evaluated by PCP in the past.  No recent issues.  Patient has a cousin who died with colon cancer in her 11s.   Current Outpatient Medications  Medication Sig Dispense Refill  . aspirin EC 81 MG tablet Take 1  tablet (81 mg total) by mouth daily. Swallow whole. 90 tablet 3  . atorvastatin (LIPITOR) 40 MG tablet Take 1 tablet (40 mg total) by mouth at bedtime.    . cyclobenzaprine (FLEXERIL) 10 MG tablet Take 10 mg by mouth 3 (three) times daily as needed for muscle spasms.    . isosorbide mononitrate (IMDUR) 30 MG 24 hr tablet Take 1 tablet (30 mg total) by mouth daily. 90 tablet 3  . nitroGLYCERIN (NITROSTAT) 0.4 MG SL tablet Place 1 tablet (0.4 mg total) under the tongue every 5 (five) minutes as needed for chest pain. 25 tablet 3  . propranolol ER (INDERAL LA) 160 MG SR capsule Take 1 capsule (160 mg total) by mouth daily. 30 capsule 5  . sertraline (ZOLOFT) 100 MG tablet Take 100 mg by mouth daily.    . temazepam (RESTORIL) 30 MG capsule Take 30 mg by mouth at bedtime.    . Testosterone Cypionate 200 MG/ML SOLN Inject 200 mg as directed every 14 (fourteen) days.    . ticagrelor (BRILINTA) 90 MG TABS tablet Take 1 tablet (90 mg total) by mouth 2 (two) times daily. 30 tablet 11  . Venlafaxine HCl 225 MG TB24 Take 225 mg by mouth daily.     No current facility-administered medications for this visit.    Allergies as of 08/13/2020 - Review Complete 08/13/2020  Allergen Reaction Noted  .  Calcium-containing compounds  07/25/2020    Past Medical History:  Diagnosis Date  . Anxiety   . Depression   . Migraine   . Morbid obesity ([place])   . Palpitations   . Sarcoidosis   . Tachycardia   . Testicular hypofunction     Past Surgical History:  Procedure Laterality Date  . CORONARY STENT INTERVENTION N/A 07/31/2020   Procedure: CORONARY STENT INTERVENTION;  Surgeon: Martinique, Peter M, MD;  Location: [facility];  Service: Cardiovascular;  Laterality: N/A;  . Fusion    . INTRAVASCULAR PRESSURE WIRE/FFR STUDY N/A 07/31/2020   Procedure: INTRAVASCULAR PRESSURE WIRE/FFR STUDY;  Surgeon: Martinique, Peter M, MD;  Location: [facility];  Service: Cardiovascular;  Laterality: N/A;  . LEFT HEART  CATH AND CORONARY ANGIOGRAPHY N/A 07/31/2020   Procedure: LEFT HEART CATH AND CORONARY ANGIOGRAPHY;  Surgeon: Martinique, Peter M, MD;  Location: [facility];  Service: Cardiovascular;  Laterality: N/A;  . LITHOTRIPSY      Family History  Problem Relation Age of Onset  . Ovarian cancer Mother   . Colon cancer Cousin 78    Social History   Socioeconomic History  . Marital status: Married    Spouse name: Not on file  . Number of children: Not on file  . Years of education: Not on file  . Highest education level: Not on file  Occupational History  . Occupation: Retired    Comment: Previously in [employer] x 22 years  Tobacco Use  . Smoking status: Former Research scientist (life sciences)  . Smokeless tobacco: Current User    Types: Snuff  Vaping Use  . Vaping Use: Never used  Substance and Sexual Activity  . Alcohol use: Yes    Comment: rare   . Drug use: Yes    Types: Marijuana  . Sexual activity: Not on file  Other Topics Concern  . Not on file  Social History Narrative  . Not on file   Social Determinants of Health   Financial Resource Strain: Not on file  Food Insecurity: Not on file  Transportation Needs: Not on file  Physical Activity: Not on file  Stress: Not on file  Social Connections: Not on file  Intimate Partner Violence: Not on file      ROS:  General: Negative for anorexia, weight loss, fever, chills, fatigue, weakness. Eyes: Negative for vision changes.  ENT: Negative for hoarseness, difficulty swallowing , nasal congestion. CV: Negative for chest pain, angina, palpitations, dyspnea on exertion, peripheral edema.  See HPI Respiratory: Negative for dyspnea at rest, dyspnea on exertion, cough, sputum, wheezing.  GI: See history of present illness. GU:  Negative for dysuria, hematuria, urinary incontinence, urinary frequency, nocturnal urination.  MS: Negative for joint pain, low back pain.  Derm: Negative for rash or itching.  Neuro: Negative for weakness, abnormal  sensation, seizure, frequent headaches, memory loss, confusion.  Psych: Negative for  suicidal ideation, hallucinations.  Positive anxiety/depression Endo: Negative for unusual weight change.  Heme: Negative for bruising or bleeding. Allergy: Negative for rash or hives.    Physical Examination:  BP 138/89   Pulse 98   Temp (!) 97.5 F (36.4 C) (Temporal)   Ht 6' (1.829 m)   Wt 235 lb 8 oz (106.8 kg)   BMI 31.94 kg/m    General: Well-nourished, well-developed in no acute distress.  Head: Normocephalic, atraumatic.   Eyes: Conjunctiva pink, no icterus. Mouth: masked Neck: Supple without thyromegaly, masses, or lymphadenopathy.  Lungs: Clear  to auscultation bilaterally.  Heart: Regular rate and rhythm, no murmurs rubs or gallops.  Abdomen: Bowel sounds are normal, nontender, nondistended, no hepatosplenomegaly or masses, no abdominal bruits or hernia , no rebound or guarding.   Rectal: not performed Extremities: No lower extremity edema. No clubbing or deformities.  Neuro: Alert and oriented x 4 , grossly normal neurologically.  Skin: Warm and dry, no rash or jaundice.   Psych: Alert and cooperative, normal mood and affect.  Labs: Lab Results  Component Value Date   CREATININE 0.88 08/01/2020   BUN 11 08/01/2020   NA 138 08/01/2020   K 4.1 08/01/2020   CL 104 08/01/2020   CO2 23 08/01/2020   Lab Results  Component Value Date   WBC 11.0 (H) 08/01/2020   HGB 16.9 08/01/2020   HCT 49.3 08/01/2020   MCV 89.0 08/01/2020   PLT 347 08/01/2020   Lab Results  Component Value Date   TSH 1.703 07/31/2020   Lab Results  Component Value Date   ALT 32 08/01/2020   AST 22 08/01/2020   ALKPHOS 81 08/01/2020   BILITOT 1.6 (H) 08/01/2020     Imaging Studies: CARDIAC CATHETERIZATION  Result Date: 07/31/2020  Prox RCA lesion is 25% stenosed.  RPDA lesion is 90% stenosed.  1st RPL lesion is 60% stenosed.  2nd RPL lesion is 60% stenosed.  Prox LAD to Mid LAD lesion is 40%  stenosed.  Dist LAD-1 lesion is 70% stenosed.  Dist LAD-2 lesion is 75% stenosed.  RPAV lesion is 50% stenosed.  1st Diag lesion is 99% stenosed.  2nd Diag lesion is 70% stenosed.  Ramus lesion is 99% stenosed.  1st Mrg lesion is 70% stenosed.  A drug-eluting stent was successfully placed using a [place] [id number].  Post intervention, there is a 0% residual stenosis.  Mid Cx to Dist Cx lesion is 99% stenosed.  A drug-eluting stent was successfully placed using a STENT RESOLUTE [id number].  Post intervention, there is a 0% residual stenosis.  The left ventricular systolic function is normal.  LV end diastolic pressure is normal.  The left ventricular ejection fraction is 55-65% by visual estimate.  1. 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 There was subtotal occlusion of the mid LCx which was a large vessel. There was also flow limiting stenosis in the first OM by RFR. 2. Normal LV function 3. Normal LVEDP 4. Successful PCI of the mid LCx with DES 5. Successful PCI of the first OM with DES Plan: DAPT for one year. Will need to treat residual small vessel disease medically. Anticipate same day DC.   DG Chest Port 1 View  Result Date: 07/31/2020 CLINICAL DATA:  Weakness.  Recent coronary stent placement. EXAM: PORTABLE CHEST 1 VIEW COMPARISON:  None. FINDINGS: The heart size and mediastinal contours are within normal limits. Both lungs are clear. The visualized skeletal structures are unremarkable. IMPRESSION: No active disease. Electronically Signed   By: Titus Dubin M.D.   On: 07/31/2020 19:47    Assessment/plan:  Pleasant 50 year old male presenting for further evaluation of upper abdominal pain/chest pain in the setting of aspirin powder use as outlined above.  At first patient felt he was having heartburn or ulcer symptoms that he was getting no relief with over-the-counter antacids.  Ultimately he was diagnosed  with three-vessel coronary artery disease requiring 2 stents and has had complete resolution of his symptoms.  Discussed with patient, given his history  of significant aspirin powder use, need for long-term Brilinta, would consider empirically treating for gastritis or ulcer disease to reduce his risk of complications such as bleeding in the setting of Brilinta.  Plan to treat with pantoprazole 40 mg daily for 3 months.  We will continue to avoid aspirin powders with exception of aspirin 81 mg daily.  If he has any alarm symptoms, he will notify us immediately at that point we may need to consider endoscopic evaluation while on Brilinta (given the need for uninterrupted treatment for 1 year).  Patient has never had a screening colonoscopy.  Patient still average risk screening, single secondary relative with colon cancer at a young age.  He describes having issues with hemorrhoids intermittently, none lately, evaluated by PCP during flare.  Would advise him to continue to monitor symptoms.  If he has recurrent bleeding, change in bowel habits, unintentional weight loss, abdominal pain, may need colonoscopy for diagnostic purposes, on Brilinta.  Preferably would wait until 1 year post stenting to complete elective procedure.  1. Start pantoprazole 40 mg daily before breakfast.  Plan for 12 weeks. 2. Consider colonoscopy and upper endoscopy March 2023 after one year of Brilinta therapy.  Patient will monitor for any alarm symptoms as outlined above, notify us if any develop. 3. Return to the office March 2023. 4.

## 2020-08-13 NOTE — Patient Instructions (Signed)
1. Start pantoprazole 40 mg daily before breakfast.  Take for 12 weeks for possible gastritis, ulcer.  I suspect your recent symptoms were all related to heart disease however given history of significant aspirin powder use and need for Brilinta, would empirically treat for possible gastritis or ulcer given increased risk of bleeding. 2. Monitor for abdominal pain, problems swallowing, black or bloody stools, change in stools.  If you develop any of these symptoms, you need to call and inform us as we may need to consider colonoscopy and upper endoscopy sooner than planned.  Preferably we need to wait on these procedures given need for 1 year of uninterrupted Brilinta use due to stents.  3. We will see back in March 2023.

## 2020-08-14 ENCOUNTER — Ambulatory Visit (INDEPENDENT_AMBULATORY_CARE_PROVIDER_SITE_OTHER): Admitting: Physician Assistant

## 2020-08-14 ENCOUNTER — Encounter: Payer: Self-pay | Admitting: Physician Assistant

## 2020-08-14 VITALS — BP 124/84 | HR 103 | Ht 72.0 in | Wt 234.2 lb

## 2020-08-14 DIAGNOSIS — R55 Syncope and collapse: Secondary | ICD-10-CM | POA: Diagnosis not present

## 2020-08-14 DIAGNOSIS — I10 Essential (primary) hypertension: Secondary | ICD-10-CM

## 2020-08-14 DIAGNOSIS — E782 Mixed hyperlipidemia: Secondary | ICD-10-CM

## 2020-08-14 DIAGNOSIS — R002 Palpitations: Secondary | ICD-10-CM | POA: Diagnosis not present

## 2020-08-14 DIAGNOSIS — I2510 Atherosclerotic heart disease of native coronary artery without angina pectoris: Secondary | ICD-10-CM

## 2020-08-14 NOTE — Patient Instructions (Signed)
Medication Instructions:  Your physician recommends that you continue on your current medications as directed. Please refer to the Current Medication list given to you today.  *If you need a refill on your cardiac medications before your next appointment, please call your pharmacy*   Lab Work: None today If you have labs (blood work) drawn today and your tests are completely normal, you will receive your results only by: Marland Kitchen MyChart Message (if you have MyChart) OR . A paper copy in the mail If you have any lab test that is abnormal or we need to change your treatment, we will call you to review the results.   Testing/Procedures: None today   Follow-Up: At [facility], you and your health needs are our priority.  As part of our continuing mission to provide you with exceptional heart care, we have created designated Provider Care Teams.  These Care Teams include your primary Cardiologist (physician) and Advanced Practice Providers (APPs -  Physician Assistants and Nurse Practitioners) who all work together to provide you with the care you need, when you need it.  We recommend signing up for the patient portal called "MyChart".  Sign up information is provided on this After Visit Summary.  MyChart is used to connect with patients for Virtual Visits (Telemedicine).  Patients are able to view lab/test results, encounter notes, upcoming appointments, etc.  Non-urgent messages can be sent to your provider as well.   To learn more about what you can do with MyChart, go to [email or web address].    Your next appointment:   3 month(s)  The format for your next appointment:   In Person  Provider:   Carlyle Dolly, MD   Other Instructions None      Thank you for choosing [facility] !

## 2020-08-14 NOTE — Progress Notes (Signed)
CC'ED TO PCP 

## 2020-08-29 ENCOUNTER — Telehealth: Payer: Self-pay | Admitting: Cardiology

## 2020-08-29 MED ORDER — ROSUVASTATIN CALCIUM 40 MG PO TABS: 40.0000 mg | ORAL_TABLET | Freq: Every day | ORAL | 3 refills | Status: DC

## 2020-08-29 NOTE — Telephone Encounter (Signed)
Medication refill request for rosuvastatin 40 mg tablets approved and sent to [facility].

## 2020-08-29 NOTE — Telephone Encounter (Signed)
*  STAT* If patient is at the pharmacy, call can be transferred to refill team.   1. Which medications need to be refilled? (please list name of each medication and dose if known) Rosuvastatin 40 mg - New  2. Which pharmacy/location (including street and city if local pharmacy) is medication to be sent to? [facility] on [address]  3. Do they need a 30 day or 90 day supply? 90  Patient called said pharmacy does not have the new Rx for the Rosuvastatin 40 mg. He said Dr. Harl Bowie had changed his Rx to this.

## 2020-08-30 ENCOUNTER — Other Ambulatory Visit (HOSPITAL_COMMUNITY): Payer: Self-pay

## 2020-10-23 ENCOUNTER — Other Ambulatory Visit: Payer: Self-pay | Admitting: Physician Assistant

## 2020-10-29 ENCOUNTER — Telehealth: Payer: Self-pay | Admitting: Cardiology

## 2020-10-29 MED ORDER — TICAGRELOR 90 MG PO TABS: 90.0000 mg | ORAL_TABLET | Freq: Two times a day (BID) | ORAL | 2 refills | Status: DC

## 2020-10-29 NOTE — Telephone Encounter (Signed)
Pt's ins company is telling him the ticagrelor (BRILINTA) 90 MG TABS tablet [[id number]]  Is a maintance drug and they're wanting him to have it sent through mail order it would need to be sent through [employer]   Please give pt a call @ [phone]

## 2020-10-29 NOTE — Telephone Encounter (Signed)
Advised that 90 day supply sent to [facility] for brilinta. Advised to contact them to check duration since he has a 3 day supply and contact us if he need samples until his supply comes in. Verbalized understanding of plan.

## 2020-10-31 ENCOUNTER — Telehealth: Payer: Self-pay | Admitting: Cardiology

## 2020-10-31 NOTE — Telephone Encounter (Signed)
Patient needs SAMPLES of Brilinta 90mg  until he gets his mail order. He is trying to go out of town and needs ASAP. Please call him to let him know when they are ready for pickup.

## 2020-10-31 NOTE — Telephone Encounter (Signed)
Brilinta Samples (#24) Lot #: [id number] Exp: 08-24-2022  Samples in cabinet at the front.

## 2020-11-21 ENCOUNTER — Telehealth (INDEPENDENT_AMBULATORY_CARE_PROVIDER_SITE_OTHER): Admitting: Cardiology

## 2020-11-21 ENCOUNTER — Telehealth: Payer: Self-pay | Admitting: Cardiology

## 2020-11-21 ENCOUNTER — Encounter: Payer: Self-pay | Admitting: Cardiology

## 2020-11-21 VITALS — Ht 72.0 in | Wt 215.0 lb

## 2020-11-21 DIAGNOSIS — I2510 Atherosclerotic heart disease of native coronary artery without angina pectoris: Secondary | ICD-10-CM

## 2020-11-21 DIAGNOSIS — R002 Palpitations: Secondary | ICD-10-CM

## 2020-11-21 NOTE — Progress Notes (Signed)
Virtual Visit via Telephone Note   This visit type was conducted due to national recommendations for restrictions regarding the COVID-19 Pandemic (e.g. social distancing) in an effort to limit this patient's exposure and mitigate transmission in our community.  Due to his co-morbid illnesses, this patient is at least at moderate risk for complications without adequate follow up.  This format is felt to be most appropriate for this patient at this time.  The patient did not have access to video technology/had technical difficulties with video requiring transitioning to audio format only (telephone).  All issues noted in this document were discussed and addressed.  No physical exam could be performed with this format.  Please refer to the patient's chart for his  consent to telehealth for [facility].    Date:  11/21/2020   ID:  Henry Webb, DOB 1970-08-15, MRN [id number] The patient was identified using 2 identifiers.  Patient Location: Home Provider Location: Office/Clinic   PCP:  Celene Squibb, MD   [facility] Providers Cardiologist:  Carlyle Dolly, MD     Evaluation Performed:  Follow-Up Visit  Chief Complaint:  Follow up  History of Present Illness:    Henry Webb is a 50 y.o. male seen today for follow up of the following medical problems .   1. Palpitations - symptoms on and off for several years - 04/2019 episodes 1% PVC burden. 02/2019 [facility] PET scan: no evidence of cardiac sarcoid   - no significant palpitations     2. Chest pain - sharp pain midchest, come activity. Mild pain, resolves with rest. No other associated symptoms - symptoms started 3-4 months ago. No change in pattern. CAD: HL, former 8 years tobacco, father CAD in 22s, paternal grandather heart diease later 35s,   - 02/2019 PET stress at [facility], some coronary calcifications, no ischemia. Of note was not having chest pains at that time, test ordered to evaluate for cardiac  sarcoid which was negative.    04/2020 GXT: no ST/T changes, [facility] treadmill score 2.30 May 2020 nuclear stress: inferolateral infarct with moderate ischemia 07/2020 cath: LM patent, LAD distal 75%, D1 99%, D2 70%, ramus 99%, LCX mid 99%, OM1 70%, RCA 25%, RPDA 90%. Received DES to LCX and OM. Other disease small vessel or diffuse distal disease  - no recent chest pains - compliant with meds, remains on ASA/brillinta   3. Sarcoid - involvement of eye, lungs, liver, kidney, lymph nodes, and neuro - followed by rheumatology     4. White coat HTN - compliant with meds     5. Syncope - isolated episode, thought to be vasovagal - no recurrent episodes     Just had new grandson by his daughter who lives in [place]   The patient does not have symptoms concerning for COVID-19 infection (fever, chills, cough, or new shortness of breath).    Past Medical History:  Diagnosis Date   Anxiety    Depression    Migraine    Morbid obesity ([place])    Palpitations    Sarcoidosis    Tachycardia    Testicular hypofunction    Past Surgical History:  Procedure Laterality Date   CORONARY STENT INTERVENTION N/A 07/31/2020   Procedure: CORONARY STENT INTERVENTION;  Surgeon: Martinique, Peter M, MD;  Location: [facility];  Service: Cardiovascular;  Laterality: N/A;   Fusion     INTRAVASCULAR PRESSURE WIRE/FFR STUDY N/A 07/31/2020   Procedure: INTRAVASCULAR PRESSURE WIRE/FFR STUDY;  Surgeon: Martinique, Peter  M, MD;  Location: [facility];  Service: Cardiovascular;  Laterality: N/A;   LEFT HEART CATH AND CORONARY ANGIOGRAPHY N/A 07/31/2020   Procedure: LEFT HEART CATH AND CORONARY ANGIOGRAPHY;  Surgeon: Martinique, Peter M, MD;  Location: [facility];  Service: Cardiovascular;  Laterality: N/A;   LITHOTRIPSY       No outpatient medications have been marked as taking for the 11/21/20 encounter (Appointment) with Arnoldo Lenis, MD.     Allergies:   Calcium-containing compounds    Social History   Tobacco Use   Smoking status: Former    Pack years: 0.00   Smokeless tobacco: Current    Types: Snuff  Vaping Use   Vaping Use: Never used  Substance Use Topics   Alcohol use: Yes    Comment: rare    Drug use: Yes    Types: Marijuana     Family Hx: The patient's family history includes Colon cancer (age of onset: 33) in his cousin; Ovarian cancer in his mother.  ROS:   Please see the history of present illness.     All other systems reviewed and are negative.   Prior CV studies:   The following studies were reviewed today:  04/2019 event monitor 72 hour monitor Min HR 76, Max HR 147, Avg HR 105 Rare supraventricular ectopy in the form of isolated PACs Occasional ventricular ectopy. 1% PVC burden. In the form of isolated PVCs, couplets, bigeminy, trigeminy. No NSVT or VT.     02/2019 Cardiac PET [facility] Impressions: - No evidence of cardiac sarcoidosis - Perfusion: Normal perfusion in the left ventricular myocardium on resting images. Metabolism: No abnormal FDG uptake noted.  - Global systolic function is mildly reduced. - Coronary calcifications are noted  Jan 2022 nuclear stress There was no ST segment deviation noted during stress. 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 This is an intermediate risk study. The left ventricular ejection fraction is normal (55-65%).  Labs/Other Tests and Data Reviewed:    EKG:  No ECG reviewed.  Recent Labs: 07/31/2020: TSH 1.703 08/01/2020: ALT 32; BUN 11; Creatinine, Ser 0.88; Hemoglobin 16.9; Platelets 347; Potassium 4.1; Sodium 138   Recent Lipid Panel No results found for: CHOL, TRIG, HDL, CHOLHDL, LDLCALC, LDLDIRECT  Wt Readings from Last 3 Encounters:  08/14/20 234 lb 3.2 oz (106.2 kg)  08/13/20 235 lb 8 oz (106.8 kg)  07/31/20 242 lb (109.8 kg)     Risk Assessment/Calculations:          Objective:    Vital Signs:   Today's  Vitals   11/21/20 0812  Weight: 215 lb (97.5 kg)  Height: 6' (1.829 m)   Body mass index is 29.16 kg/m.  Normal affect. Normal speech pattern and tone. Comfortable, no apparent distress. No audible signs of sob or wheezing.   ASSESSMENT & PLAN:    1. PVCs/Palpitations - low burden monitor, - no recent symptoms, continue beta blocker.     2. CAD - recent stenting as reported above - no recent chest pains symptoms. Has some residual small vessel/diffuse distal disease managed medically, room to titrate antianginals in the future if needed          COVID-19 Education: The signs and symptoms of COVID-19 were discussed with the patient and how to seek care for testing (follow up with PCP or arrange E-visit).  The importance of social distancing was discussed today.  Time:   Today, I have spent 15 minutes  with the patient with telehealth technology discussing the above problems.     Medication Adjustments/Labs and Tests Ordered: Current medicines are reviewed at length with the patient today.  Concerns regarding medicines are outlined above.   Tests Ordered: No orders of the defined types were placed in this encounter.   Medication Changes: No orders of the defined types were placed in this encounter.   Follow Up:  In Person 6 months  Signed, Carlyle Dolly, MD  11/21/2020 7:49 AM    [place]

## 2020-11-21 NOTE — Patient Instructions (Addendum)

## 2020-11-21 NOTE — Telephone Encounter (Signed)
  Patient Consent for Virtual Visit        Henry Webb has provided verbal consent on 11/21/2020 for a virtual visit (video or telephone).   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 I acknowledge and consent to receive the Services by the Practitioner via telemedicine. I understand that the telemedicine visit will involve communicating with the Practitioner through live audiovisual communication technology and the disclosure of certain medical information by electronic transmission. I acknowledge that I have been given the opportunity to request an in-person assessment or other available alternative prior to the telemedicine visit and am voluntarily participating in the telemedicine visit.  I understand that I have the right to withhold or withdraw my consent to the use of telemedicine in the course of my care at any time, without affecting my right to future care or treatment, and that the Practitioner or I may terminate the telemedicine visit at any time. I understand that I have the right to inspect all information obtained and/or recorded in the course of the telemedicine visit and may receive copies of available information for a reasonable fee.  I understand that some of the potential risks of receiving the Services via telemedicine include:  Delay or interruption in medical evaluation due to technological equipment failure or disruption; Information transmitted may not be sufficient (e.g. poor resolution of images) to allow for appropriate medical decision making by the  Practitioner; and/or  In rare instances, security protocols could fail, causing a breach of personal health information.  Furthermore, I acknowledge that it is my responsibility to provide information about my medical history, conditions and care that is complete and accurate to the best of my ability. I acknowledge that Practitioner's advice, recommendations, and/or decision may be based on factors not within their control, such as incomplete or inaccurate data provided by me or distortions of diagnostic images or specimens that may result from electronic transmissions. I understand that the practice of medicine is not an exact science and that Practitioner makes no warranties or guarantees regarding treatment outcomes. I acknowledge that a copy of this consent can be made available to me via my patient portal ([facility]), or I can request a printed copy by calling the office of [facility].    I understand that my insurance will be billed for this visit.   I have read or had this consent read to me. I understand the contents of this consent, which adequately explains the benefits and risks of the Services being provided via telemedicine.  I have been provided ample opportunity to ask questions regarding this consent and the Services and have had my questions answered to my satisfaction. I give my informed consent for the services to be provided through the use of telemedicine in my medical care

## 2020-11-30 ENCOUNTER — Telehealth: Payer: Self-pay | Admitting: Cardiology

## 2020-11-30 NOTE — Telephone Encounter (Signed)
Patient called in reporting he has had several episodes of hematuria.  First episodes noted on 6/26 and 6/27.  States he noted a dark brown color to his urine which resolved within that 2-day timeframe.  Had another episode on 7/8 which was milder than previous episodes.  Does have history of kidney stones, but states he has not had any flank pain. Has been hydrating well. No bright red blood noted.  He is on DAPT with aspirin/Brilinta for stenting done 3/22.  I advised he remain on DAPT.  We will route to PCP cardiologist as I suspect he will need a urology referral.  Patient voiced understanding and thanked me for follow-up call.  ER precautions given.

## 2020-12-03 NOTE — Telephone Encounter (Signed)
Would continue his blood thinners, high risk of stopping at this time. Needs to f/u with pcp to get a urology referal  Zandra Abts MD

## 2020-12-04 NOTE — Telephone Encounter (Signed)
Spoke to pt who voiced understanding of continuing his blood thinner and speaking to PCP for referral to Urology.

## 2020-12-13 ENCOUNTER — Other Ambulatory Visit: Payer: Self-pay | Admitting: Physician Assistant

## 2021-01-11 ENCOUNTER — Other Ambulatory Visit (HOSPITAL_COMMUNITY): Payer: Self-pay | Admitting: Family Medicine

## 2021-01-11 DIAGNOSIS — R059 Cough, unspecified: Secondary | ICD-10-CM

## 2021-01-11 DIAGNOSIS — U071 COVID-19: Secondary | ICD-10-CM | POA: Insufficient documentation

## 2021-04-03 DIAGNOSIS — R319 Hematuria, unspecified: Secondary | ICD-10-CM | POA: Insufficient documentation

## 2021-04-11 ENCOUNTER — Other Ambulatory Visit (HOSPITAL_COMMUNITY): Payer: Self-pay | Admitting: Internal Medicine

## 2021-04-15 ENCOUNTER — Other Ambulatory Visit (HOSPITAL_COMMUNITY): Payer: Self-pay | Admitting: Internal Medicine

## 2021-04-15 ENCOUNTER — Other Ambulatory Visit: Payer: Self-pay | Admitting: Internal Medicine

## 2021-04-15 DIAGNOSIS — R319 Hematuria, unspecified: Secondary | ICD-10-CM

## 2021-04-16 ENCOUNTER — Other Ambulatory Visit: Payer: Self-pay

## 2021-04-16 ENCOUNTER — Ambulatory Visit (HOSPITAL_COMMUNITY)
Admission: RE | Admit: 2021-04-16 | Discharge: 2021-04-16 | Disposition: A | Discharge status: Home / Self Care | Source: Ambulatory Visit | Attending: Internal Medicine | Admitting: Internal Medicine

## 2021-04-16 ENCOUNTER — Ambulatory Visit (HOSPITAL_COMMUNITY)
Admission: RE | Admit: 2021-04-16 | Discharge: 2021-04-16 | Disposition: A | Discharge status: Home / Self Care | Source: Ambulatory Visit | Attending: Family Medicine | Admitting: Family Medicine

## 2021-04-16 DIAGNOSIS — R319 Hematuria, unspecified: Secondary | ICD-10-CM | POA: Insufficient documentation

## 2021-04-16 DIAGNOSIS — R059 Cough, unspecified: Secondary | ICD-10-CM | POA: Insufficient documentation

## 2021-04-16 LAB — POCT I-STAT CREATININE: Creatinine, Ser: 0.8 mg/dL (ref 0.61–1.24)

## 2021-04-16 MED ORDER — IOHEXOL 300 MG/ML  SOLN
100.0000 mL | Freq: Once | INTRAMUSCULAR | Status: AC | PRN
  Administered 2021-04-16: 100 mL via INTRAVENOUS

## 2021-05-06 ENCOUNTER — Other Ambulatory Visit: Payer: Self-pay

## 2021-05-06 ENCOUNTER — Ambulatory Visit: Admitting: Urology

## 2021-05-06 ENCOUNTER — Encounter: Payer: Self-pay | Admitting: Urology

## 2021-05-06 VITALS — BP 148/96 | HR 101

## 2021-05-06 DIAGNOSIS — R310 Gross hematuria: Secondary | ICD-10-CM | POA: Insufficient documentation

## 2021-05-06 DIAGNOSIS — N200 Calculus of kidney: Secondary | ICD-10-CM | POA: Diagnosis not present

## 2021-05-06 DIAGNOSIS — R1031 Right lower quadrant pain: Secondary | ICD-10-CM | POA: Insufficient documentation

## 2021-05-06 NOTE — Progress Notes (Signed)
Assessment: 1. Gross hematuria   2. Nephrolithiasis   3. Right lower quadrant pain; possible right inguinal hernia     Plan: I personally reviewed the patient's CT study from 04/17/2021 small nonobstructing bilateral renal calculi.  I also reviewed the patient's records from Dr. Nevada Crane.  No evidence of renal or ureteral calculi to explain his symptoms at this time. Today I had a discussion with the patient regarding the findings of gross hematuria including the implications and differential diagnoses associated with it.  I also discussed recommendations for further evaluation including the rationale for upper tract imaging and cystoscopy.  I discussed the nature of these procedures including potential risk and complications.  The patient expressed an understanding of these issues. Urine culture sent today Referral to Gen Surgery for possible right inguinal hernia, RLQ pain Schedule for cystoscopy in 2-3 weeks Patient declined any pain medication today.  Chief Complaint:  Chief Complaint  Patient presents with   Hematuria    History of Present Illness:  Henry Webb is a 50 y.o. year old male who is seen in consultation from Celene Squibb, MD for evaluation of gross hematuria and right lower quadrant pain with radiation to the right upper scrotum.  He reports a long history of kidney stones.  He has had approximately 26 episodes of stones.  He has required ureteroscopic stone manipulation on several occasions as well as lithotripsy.  His last stone procedure was in 08-22-09.  He has passed multiple stones since that time.  He noted onset of gross hematuria while traveling in July 2022.  He had another episode in August 2022.  Recently, he has noted fairly constant gross hematuria.  Urinalysis from 04/02/2021 showed large blood.   He also has pain in the right lower quadrant with radiation to the right scrotum and testicle.  The pain symptoms have been more noticeable within the past  several days.  No flank pain or dysuria.  No scrotal swelling or erythema.  He does report urinary frequency, nocturia, and occasional incontinence. IPSS = 7 today.  He is on Brilinta following placement of a cardiac stent last year.  CT abdomen and pelvis with and without contrast from 04/17/2021 showed small bilateral nonobstructive renal calculi, no ureteral calculi or hydronephrosis.   Past Medical History:  Past Medical History:  Diagnosis Date   Anxiety    Depression    Migraine    Morbid obesity ([place])    Palpitations    Sarcoidosis    Tachycardia    Testicular hypofunction     Past Surgical History:  Past Surgical History:  Procedure Laterality Date   CORONARY STENT INTERVENTION N/A 07/31/2020   Procedure: CORONARY STENT INTERVENTION;  Surgeon: Martinique, Peter M, MD;  Location: [facility];  Service: Cardiovascular;  Laterality: N/A;   Fusion     INTRAVASCULAR PRESSURE WIRE/FFR STUDY N/A 07/31/2020   Procedure: INTRAVASCULAR PRESSURE WIRE/FFR STUDY;  Surgeon: Martinique, Peter M, MD;  Location: [facility];  Service: Cardiovascular;  Laterality: N/A;   LEFT HEART CATH AND CORONARY ANGIOGRAPHY N/A 07/31/2020   Procedure: LEFT HEART CATH AND CORONARY ANGIOGRAPHY;  Surgeon: Martinique, Peter M, MD;  Location: [facility];  Service: Cardiovascular;  Laterality: N/A;   LITHOTRIPSY      Allergies:  Allergies  Allergen Reactions   Calcium-Containing Compounds     Flares sarcoidosis     Family History:  Family History  Problem Relation Age of Onset   Ovarian cancer  Mother    Colon cancer Cousin 70    Social History:  Social History   Tobacco Use   Smoking status: Former   Smokeless tobacco: Current    Types: Snuff  Vaping Use   Vaping Use: Never used  Substance Use Topics   Alcohol use: Yes    Comment: rare    Drug use: Yes    Types: Marijuana    Review of symptoms:  Constitutional:  Negative for unexplained weight loss, night sweats, fever,  chills ENT:  Negative for nose bleeds, sinus pain, painful swallowing CV:  Negative for chest pain, shortness of breath, exercise intolerance, palpitations, loss of consciousness Resp:  Negative for cough, wheezing, shortness of breath GI:  Negative for nausea, vomiting, diarrhea, bloody stools GU:  Positives noted in HPI; otherwise negative for dysuria Neuro:  Negative for seizures, poor balance, limb weakness, slurred speech Psych:  Negative for lack of energy, depression, anxiety Endocrine:  Negative for polydipsia, polyuria, symptoms of hypoglycemia (dizziness, hunger, sweating) Hematologic:  Negative for anemia, purpura, petechia, prolonged or excessive bleeding, use of anticoagulants  Allergic:  Negative for difficulty breathing or choking as a result of exposure to anything; no shellfish allergy; no allergic response (rash/itch) to materials, foods  Physical exam: BP (!) 148/96   Pulse (!) 101  GENERAL APPEARANCE:  Well appearing, well developed, well nourished, NAD HEENT: Atraumatic, Normocephalic, oropharynx clear. NECK: Supple without lymphadenopathy or thyromegaly. LUNGS: Clear to auscultation bilaterally. HEART: Regular Rate and Rhythm without murmurs, gallops, or rubs. ABDOMEN: Soft, No Masses; tenderness to palpation of right inguinal ring. ? hernia EXTREMITIES: Moves all extremities well.  Without clubbing, cyanosis, or edema. NEUROLOGIC:  Alert and oriented x 3, normal gait, CN II-XII grossly intact.  MENTAL STATUS:  Appropriate. BACK:  Non-tender to palpation.  No CVAT SKIN:  Warm, dry and intact.   GU: Penis:  circumcised Meatus: Normal Scrotum: no erythema or edema Testis: no masses, NT Epididymis: tenderness on right, left  normal   Results: U/A:  >30 RBC, mod bacteria

## 2021-05-06 NOTE — Addendum Note (Signed)
Addended by: Iris Pert on: 05/06/2021 05:01 PM   Modules accepted: Orders

## 2021-05-06 NOTE — Progress Notes (Signed)
Urological Symptom Review  Patient is experiencing the following symptoms: Frequent urination Get up at night to urinate Leakage of urine Blood in urine Erection problems (male only)   Review of Systems  Gastrointestinal (upper)  : Indigestion/heartburn  Gastrointestinal (lower) : Diarrhea  Constitutional : Night Sweats Fatigue  Skin: Negative for skin symptoms  Eyes: Negative for eye symptoms  Ear/Nose/Throat : Sinus problems  Hematologic/Lymphatic: Easy bruising  Cardiovascular : Chest pain  Respiratory : Negative for respiratory symptoms  Endocrine: Negative for endocrine symptoms  Musculoskeletal: Back pain Joint pain  Neurological: Headaches  Psychologic: Negative for psychiatric symptoms

## 2021-05-07 LAB — URINALYSIS, ROUTINE W REFLEX MICROSCOPIC
Bilirubin, UA: NEGATIVE
Glucose, UA: NEGATIVE
Leukocytes,UA: NEGATIVE
Nitrite, UA: NEGATIVE
Specific Gravity, UA: >1.03 — ABNORMAL HIGH (ref 1.005–1.030)
Urobilinogen, Ur: 1 mg/dL (ref 0.2–1.0)
pH, UA: 5.5 (ref 5.0–7.5)

## 2021-05-07 LAB — MICROSCOPIC EXAMINATION
Epithelial Cells (non renal): NONE SEEN /hpf (ref 0–10)
RBC, Urine: >30 /hpf — AB (ref 0–2)
Renal Epithel, UA: NONE SEEN /hpf
WBC, UA: NONE SEEN /hpf (ref 0–5)

## 2021-05-10 ENCOUNTER — Telehealth: Payer: Self-pay

## 2021-05-10 LAB — URINE CULTURE

## 2021-05-10 NOTE — Telephone Encounter (Signed)
Patient called inquiring on urine culture that is still pending. Patient states the he is in discomfort from UTI symptoms and request abt be sent in until culture comes back.

## 2021-05-13 NOTE — Telephone Encounter (Signed)
Spoke with pt's with wife regarding his results pt . His still having a lot of pain  no fever , pt is going call back about make an appt  for urine.

## 2021-05-14 ENCOUNTER — Ambulatory Visit: Admitting: General Surgery

## 2021-05-14 ENCOUNTER — Encounter: Payer: Self-pay | Admitting: General Surgery

## 2021-05-14 ENCOUNTER — Other Ambulatory Visit: Payer: Self-pay

## 2021-05-14 VITALS — BP 115/75 | HR 97 | Temp 97.9°F | Resp 16 | Ht 72.0 in | Wt 227.0 lb

## 2021-05-14 DIAGNOSIS — N5089 Other specified disorders of the male genital organs: Secondary | ICD-10-CM | POA: Diagnosis not present

## 2021-05-14 MED ORDER — LEVOFLOXACIN 500 MG PO TABS: 500.0000 mg | ORAL_TABLET | Freq: Every day | ORAL | 0 refills | Status: DC

## 2021-05-14 NOTE — Progress Notes (Signed)
[facility] History and Physical  Reason for Referral: Right side groin pain  Referring Physician: Dr. Felipa Eth, MD   Chief Complaint   New Patient (Initial Visit)     Henry Webb is a 50 y.o. male.  HPI:  Henry Webb is a 50 yo who comes in with complaints of frequent renal calculi, hematuria, and recent right sided flank and groin pain that is shooting and radiating down into his groin. He says that the pain is bearable but that it can shoot down suddenly. He points to his right sided testicle area when he is hurting. He was seen by Dr. Felipa Eth regarding the above and is going to get a cystoscopy. UA and culture was done and showed mixed flora.  He says that he had some similar pain in the past and had a negative CT for hernia then.  He actually had a CT 03/2021 that also does not demonstrated a hernia. He does have some fluid in the scrotum on the right.  He had a stents X2 in March 2022 and is is suppose to stay on the Brilinta for 1 year uninterrupted. No chest pain or SOB.   Past Medical History:  Diagnosis Date   Anxiety    Depression    Migraine    Morbid obesity ([place])    Palpitations    Sarcoidosis    Tachycardia    Testicular hypofunction     Past Surgical History:  Procedure Laterality Date   CORONARY STENT INTERVENTION N/A 07/31/2020   Procedure: CORONARY STENT INTERVENTION;  Surgeon: Martinique, Peter M, MD;  Location: [facility];  Service: Cardiovascular;  Laterality: N/A;   Fusion     INTRAVASCULAR PRESSURE WIRE/FFR STUDY N/A 07/31/2020   Procedure: INTRAVASCULAR PRESSURE WIRE/FFR STUDY;  Surgeon: Martinique, Peter M, MD;  Location: [facility];  Service: Cardiovascular;  Laterality: N/A;   LEFT HEART CATH AND CORONARY ANGIOGRAPHY N/A 07/31/2020   Procedure: LEFT HEART CATH AND CORONARY ANGIOGRAPHY;  Surgeon: Martinique, Peter M, MD;  Location: [facility];  Service: Cardiovascular;  Laterality: N/A;   LITHOTRIPSY      Family  History  Problem Relation Age of Onset   Ovarian cancer Mother    Colon cancer Cousin 44    Social History   Tobacco Use   Smoking status: Former   Smokeless tobacco: Current    Types: Snuff  Vaping Use   Vaping Use: Never used  Substance Use Topics   Alcohol use: Yes    Comment: rare    Drug use: Yes    Types: Marijuana    Medications: I have reviewed the patient's current medications. Allergies as of 05/14/2021       Reactions   Calcium-containing Compounds    Flares sarcoidosis         Medication List        Accurate as of May 14, 2021  9:27 AM. If you have any questions, ask your nurse or doctor.          aspirin EC 81 MG tablet Take 1 tablet (81 mg total) by mouth daily. Swallow whole.   cyclobenzaprine 10 MG tablet Commonly known as: FLEXERIL Take 10 mg by mouth 3 (three) times daily as needed for muscle spasms.   isosorbide mononitrate 30 MG 24 hr tablet Commonly known as: IMDUR Take 30 mg by mouth daily.   nitroGLYCERIN 0.4 MG SL tablet Commonly known as: NITROSTAT ONE TABLET UNDER TONGUE AS NEEDED FOR  CHEST PAIN   propranolol ER 160 MG SR capsule Commonly known as: INDERAL LA TAKE 1 CAPSULE(160 MG) BY MOUTH DAILY   rosuvastatin 40 MG tablet Commonly known as: CRESTOR Take 1 tablet (40 mg total) by mouth daily.   sertraline 100 MG tablet Commonly known as: ZOLOFT Take 100 mg by mouth daily.   temazepam 30 MG capsule Commonly known as: RESTORIL Take 30 mg by mouth at bedtime.   Testosterone Cypionate 200 MG/ML Soln Inject 200 mg as directed every 14 (fourteen) days.   ticagrelor 90 MG Tabs tablet Commonly known as: BRILINTA Take 1 tablet (90 mg total) by mouth 2 (two) times daily.   Venlafaxine HCl 225 MG Tb24 Take 225 mg by mouth daily.         ROS:  A comprehensive review of systems was negative except for: Gastrointestinal: positive for abdominal pain, nausea, and reflux symptoms Genitourinary: positive for  frequency and hesitancy Hematologic/lymphatic: positive for sarcoidosis  Musculoskeletal: positive for back pain, neck pain, and joint pain  Blood pressure 115/75, pulse 97, temperature 97.9 F (36.6 C), temperature source Other (Comment), resp. rate 16, height 6' (1.829 m), weight 227 lb (103 kg), SpO2 95 %. Physical Exam Vitals reviewed.  Constitutional:      Appearance: Normal appearance.  HENT:     Head: Normocephalic.     Nose: Nose normal.  Eyes:     Extraocular Movements: Extraocular movements intact.  Cardiovascular:     Rate and Rhythm: Normal rate.  Abdominal:     General: There is no distension.     Palpations: Abdomen is soft.     Tenderness: There is abdominal tenderness.     Hernia: No hernia is present.     Comments: Right groin tenderness, no hernia noted, more tender on the cord in the right scrotum, minimally tender at the inguinal ring   Genitourinary:    Penis: Normal.      Testes:        Right: Tenderness present.     Epididymis:     Right: Tenderness present.     Comments: Tenderness on the right to strumming the cord structures, point tenderness in the right cord area  Musculoskeletal:        General: Normal range of motion.     Cervical back: Normal range of motion.  Skin:    General: Skin is warm.  Neurological:     General: No focal deficit present.     Mental Status: He is alert and oriented to person, place, and time.  Psychiatric:        Mood and Affect: Mood normal.        Behavior: Behavior normal.    Results:  Personally reviewed- no hernia on the right or left noted, some fluid in the right scrotum noted  CLINICAL DATA:  Right-sided flank pain, hematuria 3 months, history of kidney stones   EXAM: CT ABDOMEN AND PELVIS WITHOUT AND WITH CONTRAST   TECHNIQUE: Multidetector CT imaging of the abdomen and pelvis was performed following the standard protocol before and following the bolus administration of intravenous contrast.    CONTRAST:  157mL OMNIPAQUE IOHEXOL 300 MG/ML  SOLN   COMPARISON:  06/02/2017   FINDINGS: Lower chest: No acute abnormality. Coronary artery calcifications and stents.   Hepatobiliary: No solid liver abnormality is seen. No gallstones, gallbladder wall thickening, or biliary dilatation.   Pancreas: Unremarkable. No pancreatic ductal dilatation or surrounding inflammatory changes.   Spleen: Normal in size without  significant abnormality.   Adrenals/Urinary Tract: Adrenal glands are unremarkable. Small bilateral nonobstructive renal calculi. No ureteral calculi or hydronephrosis. Limited opacification of the distal ureters. Within this limitation, no evidence of urinary tract filling defect on delayed phase imaging. Bladder is unremarkable.   Stomach/Bowel: Stomach is within normal limits. Appendix appears normal. No evidence of bowel wall thickening, distention, or inflammatory changes.   Vascular/Lymphatic: No significant vascular findings are present. No enlarged abdominal or pelvic lymph nodes.   Reproductive: No mass or other significant abnormality.   Other: No abdominal wall hernia or abnormality. No abdominopelvic ascites.   Musculoskeletal: No acute or significant osseous findings.   IMPRESSION: 1. Small bilateral nonobstructive renal calculi. No ureteral calculi or hydronephrosis. 2. No mass or suspicious contrast enhancement. 3. Limited opacification of the distal ureters. Within this limitation, no evidence of urinary tract filling defect on delayed phase imaging. 4. Coronary artery disease.     Electronically Signed   By: Delanna Ahmadi M.D.   On: 04/17/2021 08:48  Assessment & Plan:  Kathy Wares is a 50 y.o. male with groin pain on the right especially with palpation or strumming the cord structures. He is not sexually active. He has a UA and cultures with mixed flora but is having hematuria. I offered treatment for possible epididymitis? Given  the pain in the region. There is no hernia noted on exam. I will review the Ct with another radiologist to ensure we are not missing anything.  Levaquin 500 mg daily X 10 days sent in for cord pain/ possible epididymitis?  Cystoscopy in January No hernia noted now, but would not be a candidate for any surgery regardless until after July 31, 2021 given the Brilinta and 1 year uninterrupted course  Will notify of other radiologist opinion on CT   All questions were answered to the satisfaction of the patient.     Virl Cagey 05/14/2021, 9:27 AM

## 2021-05-14 NOTE — Patient Instructions (Signed)
Epididymitis Epididymitis is inflammation or swelling of the epididymis. This is caused by an infection. The epididymis is a cord-like structure that is located along the top and back part of the testicle. It collects and stores sperm from the testicle. This condition can also cause pain and swelling of the testicle and scrotum. Symptoms usually start suddenly (acute epididymitis). Sometimes epididymitis starts gradually and lasts for a while (chronic epididymitis). Chronic epididymitis may be harder to treat. What are the causes? In men ages 78-40, this condition is usually caused by a bacterial infection or a sexually transmitted infection (STI), such as gonorrhea or chlamydia. In men 76 and older, this condition is usually caused by bacteria from a urinary blockage or from abnormalities in the urinary system. These can result from: Having a tube placed into the bladder (urinary catheter). Having an enlarged or inflamed prostate gland. Having recently had urinary tract surgery. Having a problem with a backward flow of urine (retrograde). In men who have a condition that weakens the body's defense system (immune system), such as human immunodeficiency virus (HIV), this condition can be caused by: Other bacteria, including tuberculosis and syphilis. Viruses. Fungi. Sometimes this condition occurs without infection. This may happen because of trauma or repetitive activities such as sports. What increases the risk? You are more likely to develop this condition if you have: Unprotected sex with more than one partner. Anal sex. Had recent surgery. A urinary catheter. Urinary problems. A suppressed immune system. What are the signs or symptoms? This condition usually begins suddenly with chills, fever, and pain behind the scrotum and in the testicle. Other symptoms include: Swelling of the scrotum, testicle, or both. Pain when ejaculating or urinating. Pain in the back or  abdomen. Nausea. Itching and discharge from the penis. A frequent need to pass urine. Redness, increased warmth, and tenderness of the scrotum. How is this diagnosed? Your health care provider can diagnose this condition based on your symptoms and medical history. Your health care provider will also do a physical exam to check your scrotum and testicle for swelling, pain, and redness. You may also have other tests, including: Testing of discharge from the penis. Testing your urine for infections, such as STIs. Ultrasound to check for blood flow and inflammation. Your health care provider may test you for other STIs, including HIV. How is this treated? Treatment for this condition depends on the cause. If your condition is caused by a bacterial infection, oral antibiotic medicine may be prescribed. If the bacterial infection has spread to your blood, you may need to receive IV antibiotics. For both bacterial and nonbacterial epididymitis, you may be treated with: Rest. Elevation of the scrotum. Pain medicines. Anti-inflammatory medicines. Surgery may be needed if: You have pus buildup in the scrotum (abscess). You have epididymitis that has not responded to other treatments. Follow these instructions at home: Medicines Take over-the-counter and prescription medicines only as told by your health care provider. If you were prescribed an antibiotic medicine, take it as told by your health care provider. Do not stop taking the antibiotic even if your condition improves. Sexual activity If your epididymitis was caused by an STI, avoid sexual activity until your treatment is complete. Inform your sexual partner or partners if you test positive for an STI. They may need to be treated. Do not engage in sexual activity with your partner or partners until their treatment is completed. Managing pain and swelling  If directed, raise (elevate) your scrotum and apply ice. To  do this: Put ice in a  plastic bag. Place a small towel or pillow between your legs. Rest your scrotum on the pillow or towel. Place another towel between your skin and the plastic bag. Leave the ice on for 20 minutes, 2-3 times a day. Remove the ice if your skin turns bright red. This is very important. If you cannot feel pain, heat, or cold, you have a greater risk of damage to the area. Keep your scrotum elevated and supported while resting. Ask your health care provider if you should wear a scrotal support, such as a jockstrap. Wear it as told by your health care provider. Try taking a sitz bath to help with discomfort. This is a warm water bath that is taken while you are sitting down. The water should come up to your hips and should cover your buttocks. Do this 3-4 times per day or as told by your health care provider. General instructions Drink enough fluid to keep your urine pale yellow. Return to your normal activities as told by your health care provider. Ask your health care provider what activities are safe for you. Keep all follow-up visits. This is important. Contact a health care provider if: You have a fever. Your pain medicine is not helping. Your pain is getting worse. Your symptoms do not improve within 3 days. Summary Epididymitis is inflammation or swelling of the epididymis. This is caused by an infection. This condition can also cause pain and swelling of the testicle and scrotum. Treatment for this condition depends on the cause. If your condition is caused by a bacterial infection, oral antibiotic medicine may be prescribed. Inform your sexual partner or partners if you test positive for an STI. They may need to be treated. Do not engage in sexual activity with your partner or partners until their treatment is completed. Contact a health care provider if your symptoms do not improve within 3 days. This information is not intended to replace advice given to you by your health care provider.  Make sure you discuss any questions you have with your health care provider. Document Revised: 12/19/2020 Document Reviewed: 12/19/2020 Elsevier Patient Education  [place].

## 2021-05-16 ENCOUNTER — Telehealth (INDEPENDENT_AMBULATORY_CARE_PROVIDER_SITE_OTHER): Admitting: General Surgery

## 2021-05-16 DIAGNOSIS — N5089 Other specified disorders of the male genital organs: Secondary | ICD-10-CM

## 2021-05-16 NOTE — Telephone Encounter (Signed)
[facility]  Reviewed CT with Radiologist. No hernia noted on left or right in the inguinal region.  Called patient. He is on the levaquin for the epididymitis and is feeling better already.    Curlene Labrum, MD [facility] [address] [place], [place] [address] [phone] (office)

## 2021-05-23 ENCOUNTER — Ambulatory Visit: Admitting: Cardiology

## 2021-05-23 ENCOUNTER — Encounter: Payer: Self-pay | Admitting: Cardiology

## 2021-05-23 ENCOUNTER — Other Ambulatory Visit: Payer: Self-pay

## 2021-05-23 VITALS — BP 128/90 | HR 112 | Ht 72.0 in | Wt 230.0 lb

## 2021-05-23 DIAGNOSIS — R002 Palpitations: Secondary | ICD-10-CM | POA: Diagnosis not present

## 2021-05-23 DIAGNOSIS — E782 Mixed hyperlipidemia: Secondary | ICD-10-CM

## 2021-05-23 DIAGNOSIS — I2510 Atherosclerotic heart disease of native coronary artery without angina pectoris: Secondary | ICD-10-CM | POA: Diagnosis not present

## 2021-05-23 MED ORDER — TICAGRELOR 90 MG PO TABS: 90.0000 mg | ORAL_TABLET | Freq: Two times a day (BID) | ORAL | Status: DC

## 2021-05-23 MED ORDER — NITROGLYCERIN 0.4 MG SL SUBL: SUBLINGUAL_TABLET | SUBLINGUAL | 3 refills | Status: DC

## 2021-05-23 NOTE — Progress Notes (Signed)
Clinical Summary Mr. Brum is a 50 y.o.male seen today for follow up of the following medical problems .   1. Palpitations - symptoms on and off for several years - 04/2019 episodes 1% PVC burden. 02/2019 [facility] PET scan: no evidence of cardiac sarcoid   -no significant palpitations.      2. Chest pain/CAD - sharp pain midchest, come activity. Mild pain, resolves with rest. No other associated symptoms - symptoms started 3-4 months ago. No change in pattern. CAD: HL, former 8 years tobacco, father CAD in 64s, paternal grandather heart diease later 33s,   - 02/2019 [facility] at [facility], some coronary calcifications, no ischemia. Of note was not having chest pains at that time, test ordered to evaluate for cardiac sarcoid which was negative.      04/2020 GXT: no ST/T changes, [facility] treadmill score 2.30 May 2020 nuclear stress: inferolateral infarct with moderate ischemia  07/2020 cath: LM patent, LAD distal 75%, D1 99%, D2 70%, ramus 99%, LCX mid 99%, OM1 70%, RCA 25%, RPDA 90%. Received DES to LCX and OM. Other disease small vessel or diffuse distal disease    - mild chest pain at times, no specific pattern - no exertional pains.    3. Sarcoid - involvement of eye, lungs, liver, kidney, lymph nodes, and neuro - followed by rheumatology     4. White coat HTN - compliant with meds       5. Syncope - isolated episode, thought to be vasovagal - no recurrent episodes    6. Hematuria - followed by urology, plan for cystoscopy in next few weeks.       Just had new grandson by his daughter who lives in [place], just turned 6 months.    Past Medical History:  Diagnosis Date   Anxiety    Depression    Migraine    Morbid obesity (HCC)    Palpitations    Sarcoidosis    Tachycardia    Testicular hypofunction      Allergies  Allergen Reactions   Calcium-Containing Compounds     Flares sarcoidosis      Current Outpatient Medications  Medication Sig Dispense  Refill   aspirin EC 81 MG tablet Take 1 tablet (81 mg total) by mouth daily. Swallow whole. 90 tablet 3   cyclobenzaprine (FLEXERIL) 10 MG tablet Take 10 mg by mouth 3 (three) times daily as needed for muscle spasms.     isosorbide mononitrate (IMDUR) 30 MG 24 hr tablet Take 30 mg by mouth daily.     levofloxacin (LEVAQUIN) 500 MG tablet Take 1 tablet (500 mg total) by mouth daily for 10 days. 10 tablet 0   nitroGLYCERIN (NITROSTAT) 0.4 MG SL tablet ONE TABLET UNDER TONGUE AS NEEDED FOR CHEST PAIN 25 tablet 1   propranolol ER (INDERAL LA) 160 MG SR capsule TAKE 1 CAPSULE(160 MG) BY MOUTH DAILY 30 capsule 5   rosuvastatin (CRESTOR) 40 MG tablet Take 1 tablet (40 mg total) by mouth daily. 90 tablet 3   sertraline (ZOLOFT) 100 MG tablet Take 100 mg by mouth daily.     temazepam (RESTORIL) 30 MG capsule Take 30 mg by mouth at bedtime.     Testosterone Cypionate 200 MG/ML SOLN Inject 200 mg as directed every 14 (fourteen) days.     ticagrelor (BRILINTA) 90 MG TABS tablet Take 1 tablet (90 mg total) by mouth 2 (two) times daily. 180 tablet 2   Venlafaxine HCl 225 MG TB24 Take  225 mg by mouth daily.     No current facility-administered medications for this visit.     Past Surgical History:  Procedure Laterality Date   CORONARY STENT INTERVENTION N/A 07/31/2020   Procedure: CORONARY STENT INTERVENTION;  Surgeon: Martinique, Peter M, MD;  Location: [place] [facility];  Service: Cardiovascular;  Laterality: N/A;   Fusion     INTRAVASCULAR PRESSURE WIRE/FFR STUDY N/A 07/31/2020   Procedure: INTRAVASCULAR PRESSURE WIRE/FFR STUDY;  Surgeon: Martinique, Peter M, MD;  Location: [place] [facility];  Service: Cardiovascular;  Laterality: N/A;   LEFT HEART CATH AND CORONARY ANGIOGRAPHY N/A 07/31/2020   Procedure: LEFT HEART CATH AND CORONARY ANGIOGRAPHY;  Surgeon: Martinique, Peter M, MD;  Location: [place] [facility];  Service: Cardiovascular;  Laterality: N/A;   LITHOTRIPSY       Allergies  Allergen Reactions    Calcium-Containing Compounds     Flares sarcoidosis       Family History  Problem Relation Age of Onset   Ovarian cancer Mother    Colon cancer Cousin 65     Social History Mr. Yoshida reports that he has quit smoking. His smokeless tobacco use includes snuff. Mr. Brucato reports current alcohol use.   Review of Systems CONSTITUTIONAL: No weight loss, fever, chills, weakness or fatigue.  HEENT: Eyes: No visual loss, blurred vision, double vision or yellow sclerae.No hearing loss, sneezing, congestion, runny nose or sore throat.  SKIN: No rash or itching.  CARDIOVASCULAR: per hpi RESPIRATORY: No shortness of breath, cough or sputum.  GASTROINTESTINAL: No anorexia, nausea, vomiting or diarrhea. No abdominal pain or blood.  GENITOURINARY: No burning on urination, no polyuria NEUROLOGICAL: No headache, dizziness, syncope, paralysis, ataxia, numbness or tingling in the extremities. No change in bowel or bladder control.  MUSCULOSKELETAL: No muscle, back pain, joint pain or stiffness.  LYMPHATICS: No enlarged nodes. No history of splenectomy.  PSYCHIATRIC: No history of depression or anxiety.  ENDOCRINOLOGIC: No reports of sweating, cold or heat intolerance. No polyuria or polydipsia.  Marland Kitchen   Physical Examination Today's Vitals   05/23/21 0935  BP: 128/90  Pulse: (!) 112  SpO2: 98%  Weight: 230 lb (104.3 kg)  Height: 6' (1.829 m)   Body mass index is 31.19 kg/m.  Gen: resting comfortably, no acute distress HEENT: no scleral icterus, pupils equal round and reactive, no palptable cervical adenopathy,  CV: RRR, no m/r/g no jvd Resp: Clear to auscultation bilaterally GI: abdomen is soft, non-tender, non-distended, normal bowel sounds, no hepatosplenomegaly MSK: extremities are warm, no edema.  Skin: warm, no rash Neuro:  no focal deficits Psych: appropriate affect   Diagnostic Studies     04/2019 event monitor 72 hour monitor Min HR 76, Max HR 147, Avg HR 105 Rare  supraventricular ectopy in the form of isolated PACs Occasional ventricular ectopy. 1% PVC burden. In the form of isolated PVCs, couplets, bigeminy, trigeminy. No NSVT or VT.     02/2019 Cardiac PET [facility] Impressions: - No evidence of cardiac sarcoidosis - Perfusion: Normal perfusion in the left ventricular myocardium on resting images. Metabolism: No abnormal FDG uptake noted.  - Global systolic function is mildly reduced. - Coronary calcifications are noted   Jan 2022 nuclear stress There was no ST segment deviation noted during stress. 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 This is an intermediate risk study. The left ventricular ejection fraction is normal (55-65%).  Assessment and Plan  1. PVCs/Palpitations - low burden on prior monitor, -no  significant symptoms, continue current meds      2. CAD - recent stenting as reported above - Has some residual small vessel/diffuse distal disease managed medically, room to titrate antianginals in the future if needed - no recent symptoms - can stop brillinta 07/2021  3. Hyperlipidemia - contacting pcp to see if recent lipid panel, was not at goal by 07/2020 labs. Would repeat if does not have recent value with pcp      Arnoldo Lenis, M.D.

## 2021-05-23 NOTE — Patient Instructions (Addendum)
Medication Instructions:  Stop Brilinta on 07/31/2021. Continue all other medications.     Labwork: FLP - order given today. Reminder:  Nothing to eat or drink after 12 midnight prior to labs. Office will contact with results via phone or letter.     Testing/Procedures: none  Follow-Up: 6 months   Any Other Special Instructions Will Be Listed Below (If Applicable).   If you need a refill on your cardiac medications before your next appointment, please call your pharmacy.

## 2021-05-28 ENCOUNTER — Telehealth: Payer: Self-pay

## 2021-05-28 ENCOUNTER — Other Ambulatory Visit: Admitting: Urology

## 2021-05-28 NOTE — Telephone Encounter (Signed)
Patient is needing more pain medication. Having kidney stone pain.  Please advise.  Thanks, Helene Kelp

## 2021-05-28 NOTE — Telephone Encounter (Signed)
Please see last ER note and advise if patient needs sooner appointment than 1/31.

## 2021-05-28 NOTE — Progress Notes (Deleted)
Assessment: 1. Gross hematuria   2. Nephrolithiasis      Plan: I personally reviewed the patient's CT study from 04/17/2021 small nonobstructing bilateral renal calculi.  I also reviewed the patient's records from Dr. Nevada Crane.  No evidence of renal or ureteral calculi to explain his symptoms at this time. Today I had a discussion with the patient regarding the findings of gross hematuria including the implications and differential diagnoses associated with it.  I also discussed recommendations for further evaluation including the rationale for upper tract imaging and cystoscopy.  I discussed the nature of these procedures including potential risk and complications.  The patient expressed an understanding of these issues. Urine culture sent today Referral to Gen Surgery for possible right inguinal hernia, RLQ pain Schedule for cystoscopy in 2-3 weeks Patient declined any pain medication today.  Chief Complaint:  No chief complaint on file.   History of Present Illness:  Henry Webb is a 51 y.o. year old male who is seen for further evaluation of gross hematuria and right lower quadrant pain with radiation to the right upper scrotum.  He reports a long history of kidney stones.  He has had approximately 26 episodes of stones.  He has required ureteroscopic stone manipulation on several occasions as well as lithotripsy.  His last stone procedure was in 2009-08-11.  He has passed multiple stones since that time.  He noted onset of gross hematuria while traveling in July 2022.  He had another episode in August 2022.  Recently, he has noted fairly constant gross hematuria.  Urinalysis from 04/02/2021 showed large blood.   He also has pain in the right lower quadrant with radiation to the right scrotum and testicle.  The pain symptoms have been more noticeable within the past several days.  No flank pain or dysuria.  No scrotal swelling or erythema.  He does report urinary frequency, nocturia, and  occasional incontinence. IPSS = 7 today.  He is on Brilinta following placement of a cardiac stent last year.  CT abdomen and pelvis with and without contrast from 04/17/2021 showed small bilateral nonobstructive renal calculi, no ureteral calculi or hydronephrosis.   Past Medical History:  Past Medical History:  Diagnosis Date   Anxiety    Depression    Migraine    Morbid obesity ([place])    Palpitations    Sarcoidosis    Tachycardia    Testicular hypofunction     Past Surgical History:  Past Surgical History:  Procedure Laterality Date   CORONARY STENT INTERVENTION N/A 07/31/2020   Procedure: CORONARY STENT INTERVENTION;  Surgeon: Martinique, Peter M, MD;  Location: [place] [facility];  Service: Cardiovascular;  Laterality: N/A;   Fusion     INTRAVASCULAR PRESSURE WIRE/FFR STUDY N/A 07/31/2020   Procedure: INTRAVASCULAR PRESSURE WIRE/FFR STUDY;  Surgeon: Martinique, Peter M, MD;  Location: [place] [facility];  Service: Cardiovascular;  Laterality: N/A;   LEFT HEART CATH AND CORONARY ANGIOGRAPHY N/A 07/31/2020   Procedure: LEFT HEART CATH AND CORONARY ANGIOGRAPHY;  Surgeon: Martinique, Peter M, MD;  Location: [place] [facility];  Service: Cardiovascular;  Laterality: N/A;   LITHOTRIPSY      Allergies:  Allergies  Allergen Reactions   Calcium-Containing Compounds     Flares sarcoidosis    Ibuprofen     Family History:  Family History  Problem Relation Age of Onset   Ovarian cancer Mother    Colon cancer Cousin 77    Social History:  Social History  Tobacco Use   Smoking status: Former   Smokeless tobacco: Current    Types: Snuff  Vaping Use   Vaping Use: Never used  Substance Use Topics   Alcohol use: Yes    Comment: rare    Drug use: Yes    Types: Marijuana    ROS: Constitutional:  Negative for fever, chills, weight loss CV: Negative for chest pain, previous MI, hypertension Respiratory:  Negative for shortness of breath, wheezing, sleep apnea, frequent cough GI:   Negative for nausea, vomiting, bloody stool, GERD  Physical exam: There were no vitals taken for this visit. ***  Results: ***   Procedure:  Flexible Cystourethroscopy  Pre-operative Diagnosis: {cysto diagnosis:26394}  Post-operative Diagnosis: {cysto diagnosis:26394}  Anesthesia:  local with lidocaine jelly  Surgical Narrative:  After appropriate informed consent was obtained, the patient was prepped and draped in the usual sterile fashion in the supine position.  He was correctly identified and the proper procedure delineated prior to proceeding.  Sterile lidocaine gel was instilled in the urethra. The flexible cystoscope was introduced without difficulty.  Findings:  Anterior urethra: {anterior urethral findings:26395}  Posterior urethra: {post urethral findings:26396}  Bladder: {bladder findings:26397}  Ureteral orifices: {Normal/Abnormal Appearance:21344::"normal"}  Additional findings:  Saline bladder wash for cytology {WAS/WAS NOT:[id number]::"was not"} performed.    The cystoscope was then removed.  The patient tolerated the procedure well.

## 2021-05-29 ENCOUNTER — Other Ambulatory Visit: Payer: Self-pay | Admitting: Urology

## 2021-05-29 MED ORDER — OXYCODONE HCL 5 MG PO TABS: 5.0000 mg | ORAL_TABLET | Freq: Four times a day (QID) | ORAL | 0 refills | Status: DC | PRN

## 2021-06-12 ENCOUNTER — Other Ambulatory Visit: Payer: Self-pay | Admitting: Cardiology

## 2021-06-22 ENCOUNTER — Other Ambulatory Visit: Payer: Self-pay | Admitting: Cardiology

## 2021-06-25 ENCOUNTER — Encounter: Payer: Self-pay | Admitting: Urology

## 2021-06-25 ENCOUNTER — Ambulatory Visit: Admitting: Urology

## 2021-06-25 ENCOUNTER — Other Ambulatory Visit: Payer: Self-pay

## 2021-06-25 VITALS — BP 121/76 | HR 88

## 2021-06-25 DIAGNOSIS — N200 Calculus of kidney: Secondary | ICD-10-CM | POA: Diagnosis not present

## 2021-06-25 DIAGNOSIS — R310 Gross hematuria: Secondary | ICD-10-CM | POA: Diagnosis not present

## 2021-06-25 DIAGNOSIS — N201 Calculus of ureter: Secondary | ICD-10-CM

## 2021-06-25 MED ORDER — CIPROFLOXACIN HCL 500 MG PO TABS
500.0000 mg | ORAL_TABLET | Freq: Once | ORAL | Status: AC
  Administered 2021-06-25: 500 mg via ORAL

## 2021-06-25 NOTE — Progress Notes (Signed)
Urological Symptom Review  Patient is experiencing the following symptoms: Hard to postpone urination Get up at night to urinate Blood in urine Urinary tract infection Weak stream Erection problems (male only) Kidney stones   Review of Systems  Gastrointestinal (upper)  : Indigestion/heartburn  Gastrointestinal (lower) : Negative for lower GI symptoms  Constitutional : Night Sweats Fatigue  Skin: Negative for skin symptoms  Eyes: Negative for eye symptoms  Ear/Nose/Throat : Sinus problems  Hematologic/Lymphatic: Negative for Hematologic/Lymphatic symptoms  Cardiovascular : Negative for cardiovascular symptoms  Respiratory : Negative for respiratory symptoms  Endocrine: Negative for endocrine symptoms  Musculoskeletal: Back pain Joint pain  Neurological: Headaches  Psychologic: Depression Anxiety

## 2021-06-25 NOTE — Progress Notes (Signed)
Assessment: 1. Gross hematuria   2. Ureteral calculus   3. Nephrolithiasis     Plan: Negative cystoscopy results discussed with the patient. I reviewed the CT results from 12/22 showing a distal right ureteral calculus and a left renal calculus. No obvious explanation for his right lower quadrant and right groin pain. 24 hour urine for metabolic evaluation for recurrent nephrolitiasis Return to office in 3 months  Chief Complaint:  Chief Complaint  Patient presents with   Hematuria    History of Present Illness:  Henry Webb is a 51 y.o. year old male who is seen for further evaluation of right renal calculus and gross hematuria.  At his initial visit in December 2022, he reported right lower quadrant pain with radiation to the right upper scrotum.  He has a long history of kidney stones with approximately 26 episodes of stones.  He has required ureteroscopic stone manipulation on several occasions as well as lithotripsy.  His last stone procedure was in Aug 21, 2009.  He has passed multiple stones since that time.  He noted onset of gross hematuria while traveling in July 2022.  He had another episode in August 2022.  Recently, he has noted fairly constant gross hematuria.  Urinalysis from 04/02/2021 showed large blood.   He also has pain in the right lower quadrant with radiation to the right scrotum and testicle.  The pain symptoms have been more noticeable within the past several days.  No flank pain or dysuria.  No scrotal swelling or erythema.  He does report urinary frequency, nocturia, and occasional incontinence. IPSS = 7.  He is on Brilinta following placement of a cardiac stent last year.  CT abdomen and pelvis with and without contrast from 04/17/2021 showed small bilateral nonobstructive renal calculi, no ureteral calculi or hydronephrosis. He presented to [facility] emergency room on 05/24/2021 with groin pain.  Urinalysis demonstrated >25 RBCs.  CT imaging showed a 3  mm calculus at the right UVJ as well as a 3 mm nonobstructing left renal calculus.  He passed the right ureteral calculus.  No further gross hematuria.  He continues to have pain in the right lower quadrant with radiation to the right upper scrotum.  He was seen by Dr. Constance Haw and general surgery.  No inguinal hernia was appreciated.  He was treated with Levaquin x10 days for possible epididymitis.  He presents today for cystoscopy.  Portions of the above documentation were copied from a prior visit for review purposes only.   Past Medical History:  Past Medical History:  Diagnosis Date   Anxiety    Depression    Migraine    Morbid obesity ([place])    Palpitations    Sarcoidosis    Tachycardia    Testicular hypofunction     Past Surgical History:  Past Surgical History:  Procedure Laterality Date   CORONARY STENT INTERVENTION N/A 07/31/2020   Procedure: CORONARY STENT INTERVENTION;  Surgeon: Martinique, Peter M, MD;  Location: [place] [facility];  Service: Cardiovascular;  Laterality: N/A;   Fusion     INTRAVASCULAR PRESSURE WIRE/FFR STUDY N/A 07/31/2020   Procedure: INTRAVASCULAR PRESSURE WIRE/FFR STUDY;  Surgeon: Martinique, Peter M, MD;  Location: [place] [facility];  Service: Cardiovascular;  Laterality: N/A;   LEFT HEART CATH AND CORONARY ANGIOGRAPHY N/A 07/31/2020   Procedure: LEFT HEART CATH AND CORONARY ANGIOGRAPHY;  Surgeon: Martinique, Peter M, MD;  Location: [place] [facility];  Service: Cardiovascular;  Laterality: N/A;   LITHOTRIPSY  Allergies:  Allergies  Allergen Reactions   Calcium-Containing Compounds     Flares sarcoidosis    Ibuprofen     Family History:  Family History  Problem Relation Age of Onset   Ovarian cancer Mother    Colon cancer Cousin 41    Social History:  Social History   Tobacco Use   Smoking status: Former   Smokeless tobacco: Current    Types: Snuff  Vaping Use   Vaping Use: Never used  Substance Use Topics   Alcohol use: Yes    Comment:  rare    Drug use: Yes    Types: Marijuana    ROS: Constitutional:  Negative for fever, chills, weight loss CV: Negative for chest pain, previous MI, hypertension Respiratory:  Negative for shortness of breath, wheezing, sleep apnea, frequent cough GI:  Negative for nausea, vomiting, bloody stool, GERD  Physical exam: BP 121/76    Pulse 88  GENERAL APPEARANCE:  Well appearing, well developed, well nourished, NAD HEENT:  Atraumatic, normocephalic, oropharynx clear NECK:  Supple without lymphadenopathy or thyromegaly ABDOMEN:  Soft, non-tender, no masses EXTREMITIES:  Moves all extremities well, without clubbing, cyanosis, or edema NEUROLOGIC:  Alert and oriented x 3, normal gait, CN II-XII grossly intact MENTAL STATUS:  appropriate BACK:  Non-tender to palpation, No CVAT SKIN:  Warm, dry, and intact   Results: U/A:  0-5 WBC, 0-2 RBC, few bacteria   Procedure:  Flexible Cystourethroscopy  Pre-operative Diagnosis: Gross hematuria  Post-operative Diagnosis: Gross hematuria  Anesthesia:  local with lidocaine jelly  Surgical Narrative:  After appropriate informed consent was obtained, the patient was prepped and draped in the usual sterile fashion in the supine position.  He was correctly identified and the proper procedure delineated prior to proceeding.  Sterile lidocaine gel was instilled in the urethra. The flexible cystoscope was introduced without difficulty.  Findings:  Anterior urethra: Normal  Posterior urethra: Lateral lobe hypertrophy  Bladder: Normal  Ureteral orifices: normal  Additional findings: None  Saline bladder wash for cytology was not performed.    The cystoscope was then removed.  The patient tolerated the procedure well.

## 2021-06-26 LAB — MICROSCOPIC EXAMINATION
Epithelial Cells (non renal): NONE SEEN /hpf (ref 0–10)
Renal Epithel, UA: NONE SEEN /hpf
Trichomonas, UA: NONE SEEN
Yeast, UA: NONE SEEN

## 2021-06-26 LAB — URINALYSIS, ROUTINE W REFLEX MICROSCOPIC
Bilirubin, UA: NEGATIVE
Glucose, UA: NEGATIVE
Leukocytes,UA: NEGATIVE
Nitrite, UA: NEGATIVE
Protein,UA: NEGATIVE
RBC, UA: NEGATIVE
Specific Gravity, UA: >1.03 — ABNORMAL HIGH (ref 1.005–1.030)
Urobilinogen, Ur: 0.2 mg/dL (ref 0.2–1.0)
pH, UA: 5.5 (ref 5.0–7.5)

## 2021-08-15 ENCOUNTER — Encounter: Payer: Self-pay | Admitting: Internal Medicine

## 2021-09-11 ENCOUNTER — Other Ambulatory Visit: Payer: Self-pay | Admitting: Cardiology

## 2021-09-11 ENCOUNTER — Encounter: Payer: Self-pay | Admitting: Podiatry

## 2021-09-11 ENCOUNTER — Ambulatory Visit (INDEPENDENT_AMBULATORY_CARE_PROVIDER_SITE_OTHER)

## 2021-09-11 ENCOUNTER — Ambulatory Visit: Admitting: Podiatry

## 2021-09-11 DIAGNOSIS — M7750 Other enthesopathy of unspecified foot: Secondary | ICD-10-CM

## 2021-09-11 DIAGNOSIS — M722 Plantar fascial fibromatosis: Secondary | ICD-10-CM

## 2021-09-11 DIAGNOSIS — M7751 Other enthesopathy of right foot: Secondary | ICD-10-CM

## 2021-09-11 DIAGNOSIS — M79671 Pain in right foot: Secondary | ICD-10-CM

## 2021-09-11 MED ORDER — METHYLPREDNISOLONE 4 MG PO TBPK: ORAL_TABLET | ORAL | 0 refills | Status: DC

## 2021-09-11 NOTE — Patient Instructions (Signed)

## 2021-09-11 NOTE — Progress Notes (Signed)
Subjective:  ? ?Patient ID: Henry Webb, male   DOB: 51 y.o.   MRN: [id number]  ? ?HPI ?51 year old male presents the office with concerns of right heel pain which started 3 weeks ago.  He states that he was walking outside and stepped on a big rock and since then he has had discomfort about the heel.  No bruising or swelling at the time of the injury denies any open sores or bleeding.  He states he is active on his feet which aggravates his symptoms.  No recent treatment.  No other concerns. ? ? ?Review of Systems  ?All other systems reviewed and are negative. ? ?Past Medical History:  ?Diagnosis Date  ? Anxiety   ? Depression   ? Migraine   ? Morbid obesity ([place])   ? Palpitations   ? Sarcoidosis   ? Tachycardia   ? Testicular hypofunction   ? ? ?Past Surgical History:  ?Procedure Laterality Date  ? CORONARY STENT INTERVENTION N/A 07/31/2020  ? Procedure: CORONARY STENT INTERVENTION;  Surgeon: Martinique, Peter M, MD;  Location: [place] [facility];  Service: Cardiovascular;  Laterality: N/A;  ? Fusion    ? INTRAVASCULAR PRESSURE WIRE/FFR STUDY N/A 07/31/2020  ? Procedure: INTRAVASCULAR PRESSURE WIRE/FFR STUDY;  Surgeon: Martinique, Peter M, MD;  Location: [place] [facility];  Service: Cardiovascular;  Laterality: N/A;  ? LEFT HEART CATH AND CORONARY ANGIOGRAPHY N/A 07/31/2020  ? Procedure: LEFT HEART CATH AND CORONARY ANGIOGRAPHY;  Surgeon: Martinique, Peter M, MD;  Location: [place] [facility];  Service: Cardiovascular;  Laterality: N/A;  ? LITHOTRIPSY    ? ? ? ?Current Outpatient Medications:  ?  methylPREDNISolone (MEDROL DOSEPAK) 4 MG TBPK tablet, Take as directed, Disp: 21 tablet, Rfl: 0 ?  aspirin EC 81 MG tablet, Take 1 tablet (81 mg total) by mouth daily. Swallow whole., Disp: 90 tablet, Rfl: 3 ?  cyclobenzaprine (FLEXERIL) 10 MG tablet, Take 10 mg by mouth 3 (three) times daily as needed for muscle spasms., Disp: , Rfl:  ?  isosorbide mononitrate (IMDUR) 30 MG 24 hr tablet, TAKE 1 TABLET(30 MG) BY MOUTH DAILY,  Disp: 90 tablet, Rfl: 3 ?  nitroGLYCERIN (NITROSTAT) 0.4 MG SL tablet, ONE TABLET UNDER TONGUE AS NEEDED FOR CHEST PAIN, Disp: 25 tablet, Rfl: 3 ?  propranolol ER (INDERAL LA) 160 MG SR capsule, TAKE 1 CAPSULE(160 MG) BY MOUTH DAILY, Disp: 30 capsule, Rfl: 5 ?  rosuvastatin (CRESTOR) 40 MG tablet, TAKE 1 TABLET(40 MG) BY MOUTH DAILY, Disp: 90 tablet, Rfl: 0 ?  sertraline (ZOLOFT) 100 MG tablet, Take 100 mg by mouth daily., Disp: , Rfl:  ?  temazepam (RESTORIL) 30 MG capsule, Take 30 mg by mouth at bedtime., Disp: , Rfl:  ?  Venlafaxine HCl 225 MG TB24, Take 225 mg by mouth daily., Disp: , Rfl:  ? ?Allergies  ?Allergen Reactions  ? Calcium-Containing Compounds   ?  Flares sarcoidosis   ? Ibuprofen   ? ? ? ? ? ?   ?Objective:  ?Physical Exam  ?General: AAO x3, NAD ? ?Dermatological: Skin is warm, dry and supple bilateral. There are no open sores, no preulcerative lesions, no rash or signs of infection present. ? ?Vascular: Dorsalis Pedis artery and Posterior Tibial artery pedal pulses are 2/4 bilateral with immedate capillary fill time.  There is no pain with calf compression, swelling, warmth, erythema.  ? ?Neruologic: Grossly intact via light touch bilateral.  ? ?Musculoskeletal: Tenderness to palpation along the plantar medial tubercle of the  calcaneus at the insertion of plantar fascia on the right foot. There is no pain along the course of the plantar fascia within the arch of the foot. Plantar fascia appears to be intact. There is no pain with lateral compression of the calcaneus or pain with vibratory sensation. There is no pain along the course or insertion of the achilles tendon. No other areas of tenderness to bilateral lower extremities.Muscular strength 5/5 in all groups tested bilateral. ? ?Gait: Unassisted, Nonantalgic.  ? ? ?   ?Assessment:  ? ?Right heel pain, Plantar fasciitis secondary to injury ? ?   ?Plan:  ?-Treatment options discussed including all alternatives, risks, and  complications ?-Etiology of symptoms were discussed ?-X-rays were obtained and reviewed with the patient.  No evidence of acute fracture, calcifications or foreign body. ?-Medrol dose pack ?-Plantar fascial strapping applied to help in stability and support. ?-Discussed traction, icing daily.  Discussed wearing shoes and good arch supports at all times and not going barefoot. ? ?Return in about 4 weeks (around 10/09/2021), or if symptoms worsen or fail to improve. ? ?Trula Slade DPM ? ?   ?  ?

## 2021-09-19 ENCOUNTER — Ambulatory Visit: Admitting: Urology

## 2021-09-19 NOTE — Progress Notes (Deleted)
Assessment: 1. Nephrolithiasis   2. Gross hematuria      Plan: 24 hour urine for metabolic evaluation for recurrent nephrolitiasis Return to office in 3 months  Chief Complaint:  No chief complaint on file.   History of Present Illness:  Henry Webb is a 51 y.o. year old male who is seen for further evaluation of right renal calculus and gross hematuria.  At his initial visit in December 2022, he reported right lower quadrant pain with radiation to the right upper scrotum.  He has a long history of kidney stones with approximately 26 episodes of stones.  He has required ureteroscopic stone manipulation on several occasions as well as lithotripsy.  His last stone procedure was in 2009/08/10.  He has passed multiple stones since that time.  He noted onset of gross hematuria while traveling in July 2022.  He had another episode in August 2022.  Recently, he has noted fairly constant gross hematuria.  Urinalysis from 04/02/2021 showed large blood.   He also has pain in the right lower quadrant with radiation to the right scrotum and testicle.  The pain symptoms have been more noticeable within the past several days.  No flank pain or dysuria.  No scrotal swelling or erythema.  He does report urinary frequency, nocturia, and occasional incontinence. IPSS = 7.  He is on Brilinta following placement of a cardiac stent last year.  CT abdomen and pelvis with and without contrast from 04/17/2021 showed small bilateral nonobstructive renal calculi, no ureteral calculi or hydronephrosis. He presented to [facility] emergency room on 05/24/2021 with groin pain.  Urinalysis demonstrated >25 RBCs.  CT imaging showed a 3 mm calculus at the right UVJ as well as a 3 mm nonobstructing left renal calculus.  He passed the right ureteral calculus.  No further gross hematuria.  He continued to have pain in the right lower quadrant with radiation to the right upper scrotum.  He was seen by Dr. Constance Haw in  General Surgery.  No inguinal hernia was appreciated.  He was treated with Levaquin x10 days for possible epididymitis. Cystoscopy from 1/23 demonstrated lateral lobe enlargement of the prostate and no urethral or bladder abnormalities.   Portions of the above documentation were copied from a prior visit for review purposes only.   Past Medical History:  Past Medical History:  Diagnosis Date   Anxiety    Depression    Migraine    Morbid obesity ([place])    Palpitations    Sarcoidosis    Tachycardia    Testicular hypofunction     Past Surgical History:  Past Surgical History:  Procedure Laterality Date   CORONARY STENT INTERVENTION N/A 07/31/2020   Procedure: CORONARY STENT INTERVENTION;  Surgeon: Martinique, Peter M, MD;  Location: [place] [facility];  Service: Cardiovascular;  Laterality: N/A;   Fusion     INTRAVASCULAR PRESSURE WIRE/FFR STUDY N/A 07/31/2020   Procedure: INTRAVASCULAR PRESSURE WIRE/FFR STUDY;  Surgeon: Martinique, Peter M, MD;  Location: [place] [facility];  Service: Cardiovascular;  Laterality: N/A;   LEFT HEART CATH AND CORONARY ANGIOGRAPHY N/A 07/31/2020   Procedure: LEFT HEART CATH AND CORONARY ANGIOGRAPHY;  Surgeon: Martinique, Peter M, MD;  Location: [place] [facility];  Service: Cardiovascular;  Laterality: N/A;   LITHOTRIPSY      Allergies:  Allergies  Allergen Reactions   Calcium-Containing Compounds     Flares sarcoidosis    Ibuprofen     Family History:  Family History  Problem Relation  Age of Onset   Ovarian cancer Mother    Colon cancer Cousin 35    Social History:  Social History   Tobacco Use   Smoking status: Former   Smokeless tobacco: Current    Types: Snuff  Vaping Use   Vaping Use: Never used  Substance Use Topics   Alcohol use: Yes    Comment: rare    Drug use: Yes    Types: Marijuana   ROS: Constitutional:  Negative for fever, chills, weight loss CV: Negative for chest pain, previous MI, hypertension Respiratory:  Negative for  shortness of breath, wheezing, sleep apnea, frequent cough GI:  Negative for nausea, vomiting, bloody stool, GERD  Physical exam: There were no vitals taken for this visit. GENERAL APPEARANCE:  Well appearing, well developed, well nourished, NAD HEENT:  Atraumatic, normocephalic, oropharynx clear NECK:  Supple without lymphadenopathy or thyromegaly ABDOMEN:  Soft, non-tender, no masses EXTREMITIES:  Moves all extremities well, without clubbing, cyanosis, or edema NEUROLOGIC:  Alert and oriented x 3, normal gait, CN II-XII grossly intact MENTAL STATUS:  appropriate BACK:  Non-tender to palpation, No CVAT SKIN:  Warm, dry, and intact   Results: U/A:

## 2021-10-10 ENCOUNTER — Ambulatory Visit: Admitting: Podiatry

## 2021-10-10 DIAGNOSIS — M722 Plantar fascial fibromatosis: Secondary | ICD-10-CM

## 2021-10-10 MED ORDER — TRIAMCINOLONE ACETONIDE 10 MG/ML IJ SUSP: 10.0000 mg | Freq: Once | INTRAMUSCULAR | Status: DC

## 2021-10-10 NOTE — Patient Instructions (Signed)

## 2021-10-13 NOTE — Progress Notes (Signed)
Subjective: 51 year old male presents the office today for Evaluation of right heel pain.  It is doing better but he still having tenderness.  Oral steroids helped minimally.  The taping was helpful.  Discussed the scabbing, sharp pain which is localized.  Difficulty wearing shoes still.  Pain level 3/10.  He has no other concerns.  No recent injuries or changes otherwise.  Objective: AAO x3, NAD DP/PT pulses palpable bilaterally, CRT less than 3 seconds There is continuation tenderness palpation on the plantar medial tubercle of the calcaneus at the insertion of the plantar fascia on the right foot.  Plantar fascia appears to be intact.  There is no pain with lateral compression of calcaneus.  No pain in the Achilles tendon.  No edema, erythema.  MMT 5/5. No pain with calf compression, swelling, warmth, erythema  Assessment: Right plantar fasciitis  Plan: -All treatment options discussed with the patient including all alternatives, risks, complications.  -Steroid injection performed.  See procedure note below. -Plantar fascial taping was applied. -Continue stretching, icing daily.  Continue with supportive shoe gear. -Patient encouraged to call the office with any questions, concerns, change in symptoms.   Procedure: Injection Tendon/Ligament Discussed alternatives, risks, complications and verbal consent was obtained.  Location: Right plantar fascia at the glabrous junction; medial approach. Skin Prep: Alcohol. Injectate: 0.5cc 0.5% marcaine plain, 0.5 cc 2% lidocaine plain and, 1 cc kenalog 10. Disposition: Patient tolerated procedure well. Injection site dressed with a band-aid.  Post-injection care was discussed and return precautions discussed.    Trula Slade DPM

## 2021-11-14 ENCOUNTER — Telehealth: Payer: Self-pay | Admitting: Cardiology

## 2021-11-14 NOTE — Telephone Encounter (Signed)
   Pre-operative Risk Assessment    Patient Name: Henry Webb  DOB: 23-Jan-1971 MRN: [id number]     Request for Surgical Clearance    Procedure:  Dental Extraction - Amount of Teeth to be Pulled:  1  Date of Surgery:  Clearance TBD                                 Surgeon:  Dr. Pearlie Oyster Surgeon's Group or Practice Name:   [facility] Phone number:  [phone] Fax number:  [phone]   Type of Clearance Requested:   - Medical    Type of Anesthesia:     Lidocaine with epinephrine   Additional requests/questions:      Signed, Heloise Beecham   11/14/2021, 1:51 PM

## 2021-11-14 NOTE — Telephone Encounter (Signed)
   Patient Name: Reynold Mantell  DOB: [birth year] MRN: [id number]  Primary Cardiologist: Carlyle Dolly, MD  Chart reviewed as part of pre-operative protocol coverage.   Simple dental extractions (i.e. 1-2 teeth) are considered low risk procedures per guidelines and generally do not require any specific cardiac clearance. It is also generally accepted that for simple extractions and dental cleanings, there is no need to interrupt blood thinner therapy.   SBE prophylaxis is not required for the patient from a cardiac standpoint.  I will route this recommendation to the requesting party via Epic fax function and remove from pre-op pool.  Please call with questions.  Tami Lin Key Cen, PA 11/14/2021, 2:14 PM

## 2021-11-28 DIAGNOSIS — I2510 Atherosclerotic heart disease of native coronary artery without angina pectoris: Secondary | ICD-10-CM | POA: Insufficient documentation

## 2021-12-01 DIAGNOSIS — F411 Generalized anxiety disorder: Secondary | ICD-10-CM | POA: Insufficient documentation

## 2021-12-01 DIAGNOSIS — G4700 Insomnia, unspecified: Secondary | ICD-10-CM | POA: Insufficient documentation

## 2021-12-03 ENCOUNTER — Other Ambulatory Visit (HOSPITAL_COMMUNITY): Payer: Self-pay | Admitting: Nurse Practitioner

## 2021-12-03 ENCOUNTER — Ambulatory Visit: Admitting: Podiatry

## 2021-12-03 DIAGNOSIS — D869 Sarcoidosis, unspecified: Secondary | ICD-10-CM

## 2021-12-04 ENCOUNTER — Encounter: Payer: Self-pay | Admitting: Cardiology

## 2021-12-04 ENCOUNTER — Ambulatory Visit: Admitting: Cardiology

## 2021-12-04 VITALS — BP 140/90 | HR 90 | Ht 72.0 in | Wt 229.8 lb

## 2021-12-04 DIAGNOSIS — E782 Mixed hyperlipidemia: Secondary | ICD-10-CM

## 2021-12-04 DIAGNOSIS — I2510 Atherosclerotic heart disease of native coronary artery without angina pectoris: Secondary | ICD-10-CM | POA: Diagnosis not present

## 2021-12-04 DIAGNOSIS — R002 Palpitations: Secondary | ICD-10-CM

## 2021-12-04 NOTE — Progress Notes (Signed)
Clinical Summary Henry Webb is a 51 y.o.male seen today for follow up of the following medical problems .   1. Palpitations - symptoms on and off for several years - 04/2019 episodes 1% PVC burden. 02/2019 [facility] PET scan: no evidence of cardiac sarcoid   - no recent symptoms.      2. Chest pain/CAD - sharp pain midchest, come activity. Mild pain, resolves with rest. No other associated symptoms - symptoms started 3-4 months ago. No change in pattern. CAD: HL, former 8 years tobacco, father CAD in 60s, paternal grandather heart diease later 31s,   - 02/2019 PET stress at [facility], some coronary calcifications, no ischemia. Of note was not having chest pains at that time, test ordered to evaluate for cardiac sarcoid which was negative.      04/2020 GXT: no ST/T changes, [facility] treadmill score 2.30 May 2020 nuclear stress: inferolateral infarct with moderate ischemia   07/2020 cath: LM patent, LAD distal 75%, D1 99%, D2 70%, ramus 99%, LCX mid 99%, OM1 70%, RCA 25%, RPDA 90%. Received DES to LCX and OM. Other disease small vessel or diffuse distal disease     - very physical yardwork without exertional symptoms.  - compliant withmeds   3. Sarcoid - involvement of eye, lungs, liver, kidney, lymph nodes, and neuro - followed by rheumatology     4. White coat HTN - compliant with meds   - home bp's 120s/80s     5. Syncope - isolated episode, thought to be vasovagal - no recurrent episodes    6. Hematuria/recurrent kidney stones - followed by urology       7. Hyperlipidemia  - recent labs with pcp 04/2021 : 04/2021 TC 159 TG 133 HDL 38 LDL 97    Just had new grandson by his daughter who lives in [place], just turned 6 months.  Past Medical History:  Diagnosis Date   Anxiety    Depression    Migraine    Morbid obesity (HCC)    Palpitations    Sarcoidosis    Tachycardia    Testicular hypofunction      Allergies  Allergen Reactions   Calcium-Containing  Compounds     Flares sarcoidosis    Ibuprofen      Current Outpatient Medications  Medication Sig Dispense Refill   aspirin EC 81 MG tablet Take 1 tablet (81 mg total) by mouth daily. Swallow whole. 90 tablet 3   cyclobenzaprine (FLEXERIL) 10 MG tablet Take 10 mg by mouth 3 (three) times daily as needed for muscle spasms.     isosorbide mononitrate (IMDUR) 30 MG 24 hr tablet TAKE 1 TABLET(30 MG) BY MOUTH DAILY 90 tablet 3   methylPREDNISolone (MEDROL DOSEPAK) 4 MG TBPK tablet Take as directed 21 tablet 0   nitroGLYCERIN (NITROSTAT) 0.4 MG SL tablet ONE TABLET UNDER TONGUE AS NEEDED FOR CHEST PAIN 25 tablet 3   propranolol ER (INDERAL LA) 160 MG SR capsule TAKE 1 CAPSULE(160 MG) BY MOUTH DAILY 30 capsule 5   rosuvastatin (CRESTOR) 40 MG tablet TAKE 1 TABLET(40 MG) BY MOUTH DAILY 90 tablet 0   sertraline (ZOLOFT) 100 MG tablet Take 100 mg by mouth daily.     temazepam (RESTORIL) 30 MG capsule Take 30 mg by mouth at bedtime.     Venlafaxine HCl 225 MG TB24 Take 225 mg by mouth daily.     Current Facility-Administered Medications  Medication Dose Route Frequency Provider Last Rate Last Admin  triamcinolone acetonide (KENALOG) 10 MG/ML injection 10 mg  10 mg Other Once Henry Webb, DPM         Past Surgical History:  Procedure Laterality Date   CORONARY STENT INTERVENTION N/A 07/31/2020   Procedure: CORONARY STENT INTERVENTION;  Surgeon: Martinique, Peter M, MD;  Location: [place] [facility];  Service: Cardiovascular;  Laterality: N/A;   Fusion     INTRAVASCULAR PRESSURE WIRE/FFR STUDY N/A 07/31/2020   Procedure: INTRAVASCULAR PRESSURE WIRE/FFR STUDY;  Surgeon: Martinique, Peter M, MD;  Location: [place] [facility];  Service: Cardiovascular;  Laterality: N/A;   LEFT HEART CATH AND CORONARY ANGIOGRAPHY N/A 07/31/2020   Procedure: LEFT HEART CATH AND CORONARY ANGIOGRAPHY;  Surgeon: Martinique, Peter M, MD;  Location: [place] [facility];  Service: Cardiovascular;  Laterality: N/A;   LITHOTRIPSY        Allergies  Allergen Reactions   Calcium-Containing Compounds     Flares sarcoidosis    Ibuprofen       Family History  Problem Relation Age of Onset   Ovarian cancer Mother    Colon cancer Cousin 46     Social History Mr. Doucet reports that he has quit smoking. His smokeless tobacco use includes snuff. Mr. Hopfensperger reports current alcohol use.   Review of Systems CONSTITUTIONAL: No weight loss, fever, chills, weakness or fatigue.  HEENT: Eyes: No visual loss, blurred vision, double vision or yellow sclerae.No hearing loss, sneezing, congestion, runny nose or sore throat.  SKIN: No rash or itching.  CARDIOVASCULAR: per hpi RESPIRATORY: No shortness of breath, cough or sputum.  GASTROINTESTINAL: No anorexia, nausea, vomiting or diarrhea. No abdominal pain or blood.  GENITOURINARY: No burning on urination, no polyuria NEUROLOGICAL: No headache, dizziness, syncope, paralysis, ataxia, numbness or tingling in the extremities. No change in bowel or bladder control.  MUSCULOSKELETAL: No muscle, back pain, joint pain or stiffness.  LYMPHATICS: No enlarged nodes. No history of splenectomy.  PSYCHIATRIC: No history of depression or anxiety.  ENDOCRINOLOGIC: No reports of sweating, cold or heat intolerance. No polyuria or polydipsia.  Marland Kitchen   Physical Examination Today's Vitals   12/04/21 0909  BP: 140/90  Pulse: 90  SpO2: 98%  Weight: 229 lb 12.8 oz (104.2 kg)  Height: 6' (1.829 m)   Body mass index is 31.17 kg/m.  Gen: resting comfortably, no acute distress HEENT: no scleral icterus, pupils equal round and reactive, no palptable cervical adenopathy,  CV: RRR, no m/r/g no jvd Resp: Clear to auscultation bilaterally GI: abdomen is soft, non-tender, non-distended, normal bowel sounds, no hepatosplenomegaly MSK: extremities are warm, no edema.  Skin: warm, no rash Neuro:  no focal deficits Psych: appropriate affect   Diagnostic Studies  04/2019 event monitor 72 hour  monitor Min HR 76, Max HR 147, Avg HR 105 Rare supraventricular ectopy in the form of isolated PACs Occasional ventricular ectopy. 1% PVC burden. In the form of isolated PVCs, couplets, bigeminy, trigeminy. No NSVT or VT.     02/2019 Cardiac PET [facility] Impressions: - No evidence of cardiac sarcoidosis - Perfusion: Normal perfusion in the left ventricular myocardium on resting images. Metabolism: No abnormal FDG uptake noted.  - Global systolic function is mildly reduced. - Coronary calcifications are noted   Jan 2022 nuclear stress There was no ST segment deviation noted during stress. 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 This is an intermediate risk study. The left ventricular ejection fraction is normal (55-65%).   Assessment and Plan  1.  PVCs/Palpitations - low burden on prior monitor, -no symptoms, continue propranolol       2. CAD - Has some residual small vessel/diffuse distal disease managed medically, room to titrate antianginals in the future if needed - no symptoms, contineu current meds   3. Hyperlipidemia - request labs from pcp, continue crestor  F/u 6 months      Arnoldo Lenis, M.D

## 2021-12-04 NOTE — Patient Instructions (Signed)

## 2021-12-05 ENCOUNTER — Ambulatory Visit: Admitting: Podiatry

## 2021-12-05 DIAGNOSIS — M722 Plantar fascial fibromatosis: Secondary | ICD-10-CM

## 2021-12-05 MED ORDER — TRIAMCINOLONE ACETONIDE 10 MG/ML IJ SUSP
10.0000 mg | Freq: Once | INTRAMUSCULAR | Status: AC
  Administered 2021-12-05: 10 mg

## 2021-12-05 NOTE — Progress Notes (Signed)
Subjective:   Patient ID: Henry Webb, male   DOB: 51 y.o.   MRN: [id number]   HPI Patient states the pain has come back full from when we treated it several months ago and states that it is worse when he gets up in the morning and after periods of sitting   ROS      Objective:  Physical Exam  Neurovascular status intact with acute discomfort in the plantar heel right at the insertion with the pain worse in the morning and after periods of sitting     Assessment:  Acute plantar fasciitis right present that does require significant increase in stretch     Plan:  H&P reviewed condition sterile prep and reinjected the plantar fascia right 3 mg Kenalog 5 mg Xylocaine and then dispensed a night splint ice packs and Ace wrap with all instructions on usage fitted it directly to his foot and explaining to him the usage of the device.  Reappoint if symptoms indicate

## 2021-12-06 ENCOUNTER — Other Ambulatory Visit: Payer: Self-pay | Admitting: Cardiology

## 2021-12-13 ENCOUNTER — Telehealth: Payer: Self-pay | Admitting: Cardiology

## 2021-12-13 NOTE — Telephone Encounter (Signed)
Voicemail not set up.

## 2021-12-13 NOTE — Telephone Encounter (Signed)
Pt c/o BP issue: STAT if pt c/o blurred vision, one-sided weakness or slurred speech  1. What are your last 5 BP readings? 151/101; 147/94  2. Are you having any other symptoms (ex. Dizziness, headache, blurred vision, passed out)? Nauseated, really bad headache  3. What is your BP issue? Patient experiencing higher than normal

## 2021-12-17 ENCOUNTER — Encounter: Payer: Self-pay | Admitting: *Deleted

## 2021-12-17 NOTE — Telephone Encounter (Signed)
Replied via mychart.

## 2021-12-18 NOTE — Progress Notes (Signed)
Cardiology Clinic Note   Patient Name: Henry Webb Date of Encounter: 12/20/2021  Primary Care Provider:  Celene Squibb, MD Primary Cardiologist:  Carlyle Dolly, MD  Patient Profile    Henry Webb 51 year old male presents the clinic today for follow-up evaluation of his angina and hypertension.  Past Medical History    Past Medical History:  Diagnosis Date   Anxiety    Depression    Migraine    Morbid obesity ([place])    Palpitations    Sarcoidosis    Tachycardia    Testicular hypofunction    Past Surgical History:  Procedure Laterality Date   CORONARY STENT INTERVENTION N/A 07/31/2020   Procedure: CORONARY STENT INTERVENTION;  Surgeon: Martinique, Peter M, MD;  Location: [place] [facility];  Service: Cardiovascular;  Laterality: N/A;   Fusion     INTRAVASCULAR PRESSURE WIRE/FFR STUDY N/A 07/31/2020   Procedure: INTRAVASCULAR PRESSURE WIRE/FFR STUDY;  Surgeon: Martinique, Peter M, MD;  Location: [place] [facility];  Service: Cardiovascular;  Laterality: N/A;   LEFT HEART CATH AND CORONARY ANGIOGRAPHY N/A 07/31/2020   Procedure: LEFT HEART CATH AND CORONARY ANGIOGRAPHY;  Surgeon: Martinique, Peter M, MD;  Location: [place] [facility];  Service: Cardiovascular;  Laterality: N/A;   LITHOTRIPSY      Allergies  Allergies  Allergen Reactions   Calcium-Containing Compounds     Flares sarcoidosis    Ibuprofen     History of Present Illness    Henry Webb is a PMH of angina, hypertension, near syncope, GERD, gross hematuria, nephrolithiasis, sarcoidosis, morbid obesity, HLD, right lower quadrant pain, cough, and COVID-19 infection.  He underwent cardiac catheterization 3/22 which showed patent left main stent, 75% distal left main, 99% D1, 70% D2, 99% ramus, circumflex mid 99%, OM1 70%, RCA 25%, RPDA 90% and received DES to his circumflex and OM.  His other disease was small vessel and or diffuse distal disease.  He was seen by Dr. Carlyle Dolly on  12/04/2021.  During that time he denied palpitations.  He did report occasional episodes of mid chest discomfort that were mild and resolved with rest.  His sarcoid continued to be followed by rheumatology (eye, lungs, liver, kidney, lymph nodes, and neuro).  Patient has previously been diagnosed with whitecoat hypertension.  His blood pressure at home was in 120s over 80s.  He reported a isolated episode of syncope that was felt to be vasovagal in nature.  His cholesterol panel was reviewed and showed an LDL cholesterol of 97 which is followed by his PCP.   He contacted cardiology via [place] on 12/17/2021 with reports of elevated blood pressure.  He presents to the clinic today for follow-up evaluation and states he was under increased amount of stress due to some bad news he received.  He also reports during the time of elevated blood pressures he was working in the hot weather.  He was trying to maintain hydration.  He also reports intermittent episodes of heart palpitations.  He reports brief episodes of increased palpitations with increased standing and increased physical activity.  He has been compliant with his propranolol and isosorbide.  He noted that his heart rates were in the 150s over 100s.  Today in the clinic his blood pressure is 134/88.  He also notes some pain related to 2 dental extractions and Planter fasciitis.  He has known sleep apnea and has been working on losing weight and modifying his sleep positions.  I reviewed triggers  for palpitations and he expressed understanding.  I will order a 14-day cardiac event monitor, provide triggers for palpitations, order CBC, BMP, magnesium, blood pressure log, start amlodipine 5 mg daily, and plan follow-up after cardiac event monitor results.  Today he denies chest pain, shortness of breath, lower extremity edema, fatigue, palpitations, melena, hematuria, hemoptysis, diaphoresis, weakness, presyncope, syncope, orthopnea, and PND.   Home  Medications    Prior to Admission medications   Medication Sig Start Date End Date Taking? Authorizing Provider  aspirin EC 81 MG tablet Take 1 tablet (81 mg total) by mouth daily. Swallow whole. 06/27/20   Richardson Dopp T, PA-C  cyclobenzaprine (FLEXERIL) 10 MG tablet Take 10 mg by mouth 3 (three) times daily as needed for muscle spasms.    [provider]  isosorbide mononitrate (IMDUR) 30 MG 24 hr tablet TAKE 1 TABLET(30 MG) BY MOUTH DAILY 06/12/21   Arnoldo Lenis, MD  nitroGLYCERIN (NITROSTAT) 0.4 MG SL tablet ONE TABLET UNDER TONGUE AS NEEDED FOR CHEST PAIN 05/23/21   Arnoldo Lenis, MD  propranolol ER (INDERAL LA) 160 MG SR capsule TAKE 1 CAPSULE(160 MG) BY MOUTH DAILY 06/24/21   Arnoldo Lenis, MD  rosuvastatin (CRESTOR) 40 MG tablet TAKE 1 TABLET BY MOUTH EVERY DAY 12/06/21   Arnoldo Lenis, MD  sertraline (ZOLOFT) 100 MG tablet Take 100 mg by mouth daily.    [provider]  temazepam (RESTORIL) 30 MG capsule Take 30 mg by mouth at bedtime.    [provider]  Venlafaxine HCl 225 MG TB24 Take 225 mg by mouth daily.    [provider]    Family History    Family History  Problem Relation Age of Onset   Ovarian cancer Mother    Colon cancer Cousin 53   He indicated that his mother is alive. He indicated that his cousin is deceased.  Social History    Social History   Socioeconomic History   Marital status: Married    Spouse name: Not on file   Number of children: Not on file   Years of education: Not on file   Highest education level: Not on file  Occupational History   Occupation: Retired    Comment: Previously in the [employer] x 22 years  Tobacco Use   Smoking status: Former   Smokeless tobacco: Current    Types: Snuff  Vaping Use   Vaping Use: Never used  Substance and Sexual Activity   Alcohol use: Yes    Comment: rare    Drug use: Yes    Types: Marijuana   Sexual activity: Not on file  Other Topics Concern    Not on file  Social History Narrative   Not on file   Social Determinants of Health   Financial Resource Strain: Not on file  Food Insecurity: Not on file  Transportation Needs: Not on file  Physical Activity: Not on file  Stress: Not on file  Social Connections: Not on file  Intimate Partner Violence: Not on file     Review of Systems    General:  No chills, fever, night sweats or weight changes.  Cardiovascular:  No chest pain, dyspnea on exertion, edema, orthopnea, palpitations, paroxysmal nocturnal dyspnea. Dermatological: No rash, lesions/masses Respiratory: No cough, dyspnea Urologic: No hematuria, dysuria Abdominal:   No nausea, vomiting, diarrhea, bright red blood per rectum, melena, or hematemesis Neurologic:  No visual changes, wkns, changes in mental status. All other systems reviewed and are otherwise  negative except as noted above.  Physical Exam    VS:  BP 134/88   Pulse 87   Ht 6' (1.829 m)   Wt 228 lb 3.2 oz (103.5 kg)   SpO2 98%   BMI 30.95 kg/m  , BMI Body mass index is 30.95 kg/m. GEN: Well nourished, well developed, in no acute distress. HEENT: normal. Neck: Supple, no JVD, carotid bruits, or masses. Cardiac: RRR, no murmurs, rubs, or gallops. No clubbing, cyanosis, edema.  Radials/DP/PT 2+ and equal bilaterally.  Respiratory:  Respirations regular and unlabored, clear to auscultation bilaterally. GI: Soft, nontender, nondistended, BS + x 4. MS: no deformity or atrophy. Skin: warm and dry, no rash. Neuro:  Strength and sensation are intact. Psych: Normal affect.  Accessory Clinical Findings    Recent Labs: 04/16/2021: Creatinine, Ser 0.80   Recent Lipid Panel No results found for: "CHOL", "TRIG", "HDL", "CHOLHDL", "VLDL", "LDLCALC", "LDLDIRECT"  ECG personally reviewed by me today-none today.  04/2019 event monitor 72 hour monitor Min HR 76, Max HR 147, Avg HR 105 Rare supraventricular ectopy in the form of isolated PACs Occasional  ventricular ectopy. 1% PVC burden. In the form of isolated PVCs, couplets, bigeminy, trigeminy. No NSVT or VT.     02/2019 Cardiac PET [facility] Impressions: - No evidence of cardiac sarcoidosis - Perfusion: Normal perfusion in the left ventricular myocardium on resting images. Metabolism: No abnormal FDG uptake noted.  - Global systolic function is mildly reduced. - Coronary calcifications are noted   Jan 2022 nuclear stress There was no ST segment deviation noted during stress. 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 This is an intermediate risk study. The left ventricular ejection fraction is normal (55-65%).  Cardiac catheterization 3/22  LM patent, LAD distal 75%, D1 99%, D2 70%, ramus 99%, LCX mid 99%, OM1 70%, RCA 25%, RPDA 90%. Received DES to LCX and OM. Other disease small vessel or diffuse distal disease  Assessment & Plan   1.  Essential hypertension-BP today 134/88.  Home blood pressure readings 150s over 100s. Continue propranolol Start amlodipine 5 mg daily Heart healthy low-sodium diet-salty 6 given Increase physical activity as tolerated Maintain blood pressure log Secondary causes of hypertension reviewed  Palpitations-reports increased amount of palpitations with increased physical activity and long periods of standing. Order cardiac event monitor CBC, BMP, magnesium Avoid triggers for palpitations-caffeine, chocolate, EtOH, dehydration, OSA etc.  Coronary artery disease-no chest pain today.  Denies recent episodes of arm back discomfort.  Underwent cardiac catheterization 3/22 and received PCI with DES to his circumflex and OM. Continue Imdur, rosuvastatin, aspirin Heart healthy low-sodium diet-salty 6 given Increase physical activity as tolerated  Syncope, palpitaions- Reports tunnel vision with standing for long periods. Cardiac event monitor 14 day  Maintain p.o.  hydration  Sarcoidosis-continues to follow with rheumatology.  Involvement of lungs, liver, kidneys, lymph nodes, eye, and neuro.   Hyperlipidemia-LDL 97 on 12/22 Continue aspirin, rosuvastatin Heart healthy low-sodium diet-salty 6 given Increase physical activity as tolerated Follows with PCP  Disposition: Follow-up with Dr. Harl Bowie or me in   [place]. Shephanie Romas NP-C     12/20/2021, 8:41 AM [address] Office [phone] Fax [phone]  Notice: This dictation was prepared with Dragon dictation along with smaller phrase technology. Any transcriptional errors that result from this process are unintentional and may not be corrected upon review.  I spent minutes 14 examining this patient, reviewing medications, and using  patient  centered shared decision making involving her cardiac care.  Prior to her visit I spent greater than 20 minutes reviewing her past medical history,  medications, and prior cardiac tests.

## 2021-12-20 ENCOUNTER — Encounter: Payer: Self-pay | Admitting: General Practice

## 2021-12-20 ENCOUNTER — Other Ambulatory Visit: Payer: Self-pay | Admitting: Cardiology

## 2021-12-20 ENCOUNTER — Ambulatory Visit (INDEPENDENT_AMBULATORY_CARE_PROVIDER_SITE_OTHER)

## 2021-12-20 ENCOUNTER — Ambulatory Visit: Admitting: General Practice

## 2021-12-20 VITALS — BP 134/88 | HR 87 | Ht 72.0 in | Wt 228.2 lb

## 2021-12-20 DIAGNOSIS — I10 Essential (primary) hypertension: Secondary | ICD-10-CM | POA: Diagnosis not present

## 2021-12-20 DIAGNOSIS — R002 Palpitations: Secondary | ICD-10-CM

## 2021-12-20 DIAGNOSIS — I2510 Atherosclerotic heart disease of native coronary artery without angina pectoris: Secondary | ICD-10-CM | POA: Diagnosis not present

## 2021-12-20 DIAGNOSIS — E782 Mixed hyperlipidemia: Secondary | ICD-10-CM | POA: Diagnosis not present

## 2021-12-20 DIAGNOSIS — R55 Syncope and collapse: Secondary | ICD-10-CM

## 2021-12-20 LAB — BASIC METABOLIC PANEL
BUN/Creatinine Ratio: 19 (ref 9–20)
BUN: 17 mg/dL (ref 6–24)
CO2: 28 mmol/L (ref 20–29)
Calcium: 10 mg/dL (ref 8.7–10.2)
Chloride: 99 mmol/L (ref 96–106)
Creatinine, Ser: 0.91 mg/dL (ref 0.76–1.27)
Glucose: 93 mg/dL (ref 70–99)
Potassium: 5.1 mmol/L (ref 3.5–5.2)
Sodium: 139 mmol/L (ref 134–144)
eGFR: 103 mL/min/{1.73_m2} (ref >59)

## 2021-12-20 LAB — CBC
Hematocrit: 47.2 % (ref 37.5–51.0)
Hemoglobin: 15.9 g/dL (ref 13.0–17.7)
MCH: 29.2 pg (ref 26.6–33.0)
MCHC: 33.7 g/dL (ref 31.5–35.7)
MCV: 87 fL (ref 79–97)
Platelets: 344 10*3/uL (ref 150–450)
RBC: 5.44 x10E6/uL (ref 4.14–5.80)
RDW: 13.7 % (ref 11.6–15.4)
WBC: 8.7 10*3/uL (ref 3.4–10.8)

## 2021-12-20 MED ORDER — AMLODIPINE BESYLATE 5 MG PO TABS: 5.0000 mg | ORAL_TABLET | Freq: Every day | ORAL | 3 refills | Status: DC

## 2021-12-20 NOTE — Patient Instructions (Signed)
Medication Instructions:  START AMLODIPINE '5MG'$  DAILY  *If you need a refill on your cardiac medications before your next appointment, please call your pharmacy*  Lab Work:    CBC, BMET AND MAG TODAY     If you have labs (blood work) drawn today and your tests are completely normal, you will receive your results only by: [place] (if you have MyChart) OR  A paper copy in the mail If you have any lab test that is abnormal or we need to change your treatment, we will call you to review the results.  You may also go to any of these [facility] locations:  [address],  [place] [address] ([facility])  [address]  [place] [address] ([facility])  Special Instructions Please try to avoid these triggers: Do not use any products that have nicotine or tobacco in them. These include cigarettes, e-cigarettes, and chewing tobacco. If you need help quitting, ask your doctor. Eat heart-healthy foods. Talk with your doctor about the right eating plan for you. Exercise regularly as told by your doctor. Stay hydrated Do not drink alcohol, Caffeine or chocolate. Lose weight if you are overweight. Do not use drugs, including cannabis Sleep apnea  Your physician has requested you wear a ZIO patch monitor for 14 days. SEE ATTACHED  Follow-Up: Your next appointment:  8 week(s) In Person with Carlyle Dolly, MD  or Coletta Memos, FNP   At [facility], you and your health needs are our priority.  As part of our continuing mission to provide you with exceptional heart care, we have created designated Provider Care Teams.  These Care Teams include your primary Cardiologist (physician) and Advanced Practice Providers  (APPs -  Physician Assistants and Nurse Practitioners) who all work together to provide you with the care you need, when you need it.  Important Information About Sugar     ZIO XT- Long Term Monitor Instructions  Your physician has requested you wear a ZIO patch monitor for 14 days.  This is a single patch monitor. [employer] supplies one patch monitor per enrollment. Additional stickers are not available. Please do not apply patch if you will be having a Nuclear Stress Test,  Echocardiogram, Cardiac CT, MRI, or Chest Xray during the period you would be wearing the  monitor. The patch cannot be worn during these tests. You cannot remove and re-apply the  ZIO XT patch monitor.  Your ZIO patch monitor will be mailed 3 day USPS to your address on file. It may take 3-5 days  to receive your monitor after you have been enrolled.  Once you have received your monitor, please review the enclosed instructions. Your monitor  has already been registered assigning a specific monitor serial # to you.  Billing and Patient Assistance Program Information  We have supplied [employer] with any of your insurance information on file for billing purposes. [employer] offers a sliding scale Patient Assistance Program for patients that do not have  insurance, or whose insurance does not completely cover the cost of the ZIO monitor.  You must apply for the Patient Assistance Program to qualify for this discounted rate.  To  apply, please call [employer] at [phone], select option 4, select option 2, ask to apply for  Patient Assistance Program. Theodore Demark will ask your household income, and how many people  are in your household. They will quote your out-of-pocket cost based on that information.  [employer] will also be able to set up a 58-month interest-free payment plan if needed.  Applying the monitor   Shave hair from upper left chest.  Hold abrader disc by orange tab. Rub abrader in 40 strokes over the upper left  chest as  indicated in your monitor instructions.  Clean area with 4 enclosed alcohol pads. Let dry.  Apply patch as indicated in monitor instructions. Patch will be placed under collarbone on left  side of chest with arrow pointing upward.  Rub patch adhesive wings for 2 minutes. Remove white label marked "1". Remove the white  label marked "2". Rub patch adhesive wings for 2 additional minutes.  While looking in a mirror, press and release button in center of patch. A small green light will  flash 3-4 times. This will be your only indicator that the monitor has been turned on.  Do not shower for the first 24 hours. You may shower after the first 24 hours.  Press the button if you feel a symptom. You will hear a small click. Record Date, Time and  Symptom in the Patient Logbook.  When you are ready to remove the patch, follow instructions on the last 2 pages of Patient  Logbook. Stick patch monitor onto the last page of Patient Logbook.  Place Patient Logbook in the blue and white box. Use locking tab on box and tape box closed  securely. The blue and white box has prepaid postage on it. Please place it in the mailbox as  soon as possible. Your physician should have your test results approximately 7 days after the  [facility]  Call [place] [phone] you have questions regarding  your ZIO XT patch monitor. Call them immediately if you see an orange light blinking on your  monitor.  If your monitor falls off in less than 4 days, contact our Monitor department at [phone]  If your monitor becomes loose or falls off after 4 days call [employer] at [phone]  suggestions on securing your monitor

## 2021-12-20 NOTE — Progress Notes (Unsigned)
Zio XT - 14 day - registered to be mailed to pt's home address

## 2021-12-25 ENCOUNTER — Telehealth: Payer: Self-pay

## 2021-12-25 NOTE — Telephone Encounter (Signed)
Patient came in the office stating that he received the 14 day monitor and after wearing for 2 hours, the monitor fell off due to excessive sweating. Instructed the patient to call the company, since the monitor was mailed to home, and request to have another monitor mailed to him. Also instructed him to come by the office for help with putting the monitor back on once it arrives.  Patient verbalized understanding.

## 2022-01-01 DIAGNOSIS — R002 Palpitations: Secondary | ICD-10-CM

## 2022-02-09 NOTE — Progress Notes (Unsigned)
Cardiology Clinic Note   Patient Name: Henry Webb Date of Encounter: 02/10/2022  Primary Care Provider:  Celene Squibb, MD Primary Cardiologist:  Carlyle Dolly, MD  Patient Profile    Henry Webb 51 year old male presents the clinic today for follow-up evaluation of his angina and hypertension.  Past Medical History    Past Medical History:  Diagnosis Date   Anxiety    Depression    Migraine    Morbid obesity ([place])    Palpitations    Sarcoidosis    Tachycardia    Testicular hypofunction    Past Surgical History:  Procedure Laterality Date   CORONARY STENT INTERVENTION N/A 07/31/2020   Procedure: CORONARY STENT INTERVENTION;  Surgeon: Martinique, Peter M, MD;  Location: [place] [facility];  Service: Cardiovascular;  Laterality: N/A;   Fusion     INTRAVASCULAR PRESSURE WIRE/FFR STUDY N/A 07/31/2020   Procedure: INTRAVASCULAR PRESSURE WIRE/FFR STUDY;  Surgeon: Martinique, Peter M, MD;  Location: [place] [facility];  Service: Cardiovascular;  Laterality: N/A;   LEFT HEART CATH AND CORONARY ANGIOGRAPHY N/A 07/31/2020   Procedure: LEFT HEART CATH AND CORONARY ANGIOGRAPHY;  Surgeon: Martinique, Peter M, MD;  Location: [place] [facility];  Service: Cardiovascular;  Laterality: N/A;   LITHOTRIPSY      Allergies  Allergies  Allergen Reactions   Calcium-Containing Compounds     Flares sarcoidosis    Ibuprofen     History of Present Illness    Henry Webb is a PMH of angina, hypertension, near syncope, GERD, gross hematuria, nephrolithiasis, sarcoidosis, morbid obesity, HLD, right lower quadrant pain, cough, and COVID-19 infection.  He underwent cardiac catheterization 3/22 which showed patent left main stent, 75% distal left main, 99% D1, 70% D2, 99% ramus, circumflex mid 99%, OM1 70%, RCA 25%, RPDA 90% and received DES to his circumflex and OM.  His other disease was small vessel and or diffuse distal disease.  He was seen by Dr. Carlyle Dolly on  12/04/2021.  During that time he denied palpitations.  He did report occasional episodes of mid chest discomfort that were mild and resolved with rest.  His sarcoid continued to be followed by rheumatology (eye, lungs, liver, kidney, lymph nodes, and neuro).  Patient has previously been diagnosed with whitecoat hypertension.  His blood pressure at home was in 120s over 80s.  He reported a isolated episode of syncope that was felt to be vasovagal in nature.  His cholesterol panel was reviewed and showed an LDL cholesterol of 97 which is followed by his PCP.   He contacted cardiology via [place] on 12/17/2021 with reports of elevated blood pressure.  He presents to the clinic today for follow-up evaluation and states he was under increased amount of stress due to some bad news he received.  He also reports during the time of elevated blood pressures he was working in the hot weather.  He was trying to maintain hydration.  He also reports intermittent episodes of heart palpitations.  He reports brief episodes of increased palpitations with increased standing and increased physical activity.  He has been compliant with his propranolol and isosorbide.  He noted that his heart rates were in the 150s over 100s.  Today in the clinic his blood pressure is 134/88.  He also notes some pain related to 2 dental extractions and Planter fasciitis.  He has known sleep apnea and has been working on losing weight and modifying his sleep positions.  I reviewed triggers  for palpitations and he expressed understanding.  I will order a 14-day cardiac event monitor, provide triggers for palpitations, order CBC, BMP, magnesium, blood pressure log, start amlodipine 5 mg daily, and plan follow-up after cardiac event monitor results.  He presents to the clinic today for follow-up evaluation and states he had trouble keeping the cardiac event monitor on.  The first monitor stayed on for only a few hours and he went to the [place] office.   They reordered his monitor and he wore for almost 3 days.  He did report that between the first monitor and second monitor he had an event at home.  His neighbors dog got into his chickens.  He chased the dog around and after the event had some dizziness and vision changes.  He laid down on his front yard, his wife brought him some water, he took a nap, and since that time he has felt significantly improved.  His blood pressures been well controlled at home.  He has also stopped caffeine.  He has noticed much fewer PVCs/palpitations since discontinuing caffeine.  I will continue his current medication regimen, have him continue to avoid triggers, have him maintain his physical activity and plan follow-up in 6 months in [place].  Today he denies chest pain, shortness of breath, lower extremity edema, fatigue, palpitations, melena, hematuria, hemoptysis, diaphoresis, weakness, presyncope, syncope, orthopnea, and PND.   Home Medications    Prior to Admission medications   Medication Sig Start Date End Date Taking? Authorizing Provider  aspirin EC 81 MG tablet Take 1 tablet (81 mg total) by mouth daily. Swallow whole. 06/27/20   Richardson Dopp T, PA-C  cyclobenzaprine (FLEXERIL) 10 MG tablet Take 10 mg by mouth 3 (three) times daily as needed for muscle spasms.    [provider]  isosorbide mononitrate (IMDUR) 30 MG 24 hr tablet TAKE 1 TABLET(30 MG) BY MOUTH DAILY 06/12/21   Arnoldo Lenis, MD  nitroGLYCERIN (NITROSTAT) 0.4 MG SL tablet ONE TABLET UNDER TONGUE AS NEEDED FOR CHEST PAIN 05/23/21   Arnoldo Lenis, MD  propranolol ER (INDERAL LA) 160 MG SR capsule TAKE 1 CAPSULE(160 MG) BY MOUTH DAILY 06/24/21   Arnoldo Lenis, MD  rosuvastatin (CRESTOR) 40 MG tablet TAKE 1 TABLET BY MOUTH EVERY DAY 12/06/21   Arnoldo Lenis, MD  sertraline (ZOLOFT) 100 MG tablet Take 100 mg by mouth daily.    [provider]  temazepam (RESTORIL) 30 MG capsule Take 30 mg by mouth at bedtime.     [provider]  Venlafaxine HCl 225 MG TB24 Take 225 mg by mouth daily.    [provider]    Family History    Family History  Problem Relation Age of Onset   Ovarian cancer Mother    Colon cancer Cousin 28   He indicated that his mother is alive. He indicated that his cousin is deceased.  Social History    Social History   Socioeconomic History   Marital status: Married    Spouse name: Not on file   Number of children: Not on file   Years of education: Not on file   Highest education level: Not on file  Occupational History   Occupation: Retired    Comment: Previously in [employer] x 22 years  Tobacco Use   Smoking status: Former   Smokeless tobacco: Current    Types: Snuff  Vaping Use   Vaping Use: Never used  Substance and Sexual Activity  Alcohol use: Yes    Comment: rare    Drug use: Yes    Types: Marijuana   Sexual activity: Not on file  Other Topics Concern   Not on file  Social History Narrative   Not on file   Social Determinants of Health   Financial Resource Strain: Not on file  Food Insecurity: Not on file  Transportation Needs: Not on file  Physical Activity: Not on file  Stress: Not on file  Social Connections: Not on file  Intimate Partner Violence: Not on file     Review of Systems    General:  No chills, fever, night sweats or weight changes.  Cardiovascular:  No chest pain, dyspnea on exertion, edema, orthopnea, palpitations, paroxysmal nocturnal dyspnea. Dermatological: No rash, lesions/masses Respiratory: No cough, dyspnea Urologic: No hematuria, dysuria Abdominal:   No nausea, vomiting, diarrhea, bright red blood per rectum, melena, or hematemesis Neurologic:  No visual changes, wkns, changes in mental status. All other systems reviewed and are otherwise negative except as noted above.  Physical Exam    VS:  BP 106/72 (BP Location: Left Arm, Patient Position: Sitting, Cuff Size: Large)   Pulse 89   Ht 6'  (1.829 m)   Wt 230 lb 9.6 oz (104.6 kg)   SpO2 99%   BMI 31.27 kg/m  , BMI Body mass index is 31.27 kg/m. GEN: Well nourished, well developed, in no acute distress. HEENT: normal. Neck: Supple, no JVD, carotid bruits, or masses. Cardiac: RRR, no murmurs, rubs, or gallops. No clubbing, cyanosis, edema.  Radials/DP/PT 2+ and equal bilaterally.  Respiratory:  Respirations regular and unlabored, clear to auscultation bilaterally. GI: Soft, nontender, nondistended, BS + x 4. MS: no deformity or atrophy. Skin: warm and dry, no rash. Neuro:  Strength and sensation are intact. Psych: Normal affect.  Accessory Clinical Findings    Recent Labs: 12/20/2021: BUN 17; Creatinine, Ser 0.91; Hemoglobin 15.9; Platelets 344; Potassium 5.1; Sodium 139   Recent Lipid Panel No results found for: "CHOL", "TRIG", "HDL", "CHOLHDL", "VLDL", "LDLCALC", "LDLDIRECT"  ECG personally reviewed by me today-none today.  04/2019 event monitor 72 hour monitor Min HR 76, Max HR 147, Avg HR 105 Rare supraventricular ectopy in the form of isolated PACs Occasional ventricular ectopy. 1% PVC burden. In the form of isolated PVCs, couplets, bigeminy, trigeminy. No NSVT or VT.     02/2019 Cardiac PET [facility] Impressions: - No evidence of cardiac sarcoidosis - Perfusion: Normal perfusion in the left ventricular myocardium on resting images. Metabolism: No abnormal FDG uptake noted.  - Global systolic function is mildly reduced. - Coronary calcifications are noted   Jan 2022 nuclear stress There was no ST segment deviation noted during stress. 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 This is an intermediate risk study. The left ventricular ejection fraction is normal (55-65%).  Cardiac catheterization 3/22  LM patent, LAD distal 75%, D1 99%, D2 70%, ramus 99%, LCX mid 99%, OM1 70%, RCA 25%, RPDA 90%. Received DES to LCX and OM. Other disease small  vessel or diffuse distal disease  Cardiac event monitor 01/17/22    3 day monitor   Rare supraventricular ectopy in the form of isolated PACs.   Rare ventricular ectopy in the form of isolated PVCs   Episodes of second degree mobitz I Desiree Hane) block   No symptoms reported     Patch Wear Time:  2 days and 18 hours (2023-08-09T11:15:06-398 to 2023-08-12T05:38:26-0400)   Patient had a min HR  of 36 bpm, max HR of 153 bpm, and avg HR of 95 bpm. Predominant underlying rhythm was Sinus Rhythm. Second Degree AV Block-Mobitz I (Wenckebach) was present. Isolated SVEs were rare (<1.0%), and no SVE Couplets or SVE Triplets were  present. Isolated VEs were rare (<1.0%), and no VE Couplets or VE Triplets were present. Ventricular Bigeminy was present.   Assessment & Plan   1.  Palpitations-previously reported  increased amount of palpitations with increased physical activity and long periods of standing.  Cardiac event monitor showed predominantly normal sinus rhythm with episode of second-degree A-V block-Mobitz type I, isolated SVE's, and ventricular bigeminy.  Lab work unremarkable.  Had an event at home that happened while he was between cardiac event monitors (around August 6).  Reports no cardiac symptoms since that time. Avoid triggers for palpitations-caffeine, chocolate, EtOH, dehydration, OSA etc. Continue propranolol Maintain p.o. hydration Increase physical activity as tolerated  Essential hypertension-BP today 106/72.  Blood pressure much better control at home.   Continue propranolol, amlodipine Heart healthy low-sodium diet-salty 6 reviewed Increase physical activity as tolerated Maintain blood pressure log Secondary causes of hypertension reviewed   Coronary artery disease-no chest pain today for recent episodes. Underwent cardiac catheterization 3/22 and received PCI with DES to his circumflex and OM. Continue Imdur, rosuvastatin, aspirin Heart healthy low-sodium diet-salty 6  given Increase physical activity as tolerated  Syncope-continues to reports tunnel vision with standing for long periods.  Does not appear to be related to cardiac issues. Maintain p.o. hydration Follow-up with PCP  Sarcoidosis-continues to follow with rheumatology.  Involvement of lungs, liver, kidneys, lymph nodes, eye, and neuro.   Disposition: Follow-up with Dr. Zandra Abts or me in 4-6 months  Jossie Ng. Lakendra Helling NP-C     02/10/2022, 11:40 AM [place] [address] Office [phone] Fax [phone]  Notice: This dictation was prepared with Dragon dictation along with smaller phrase technology. Any transcriptional errors that result from this process are unintentional and may not be corrected upon review.  I spent minutes 13 examining this patient, reviewing medications, and using  patient centered shared decision making involving her cardiac care.  Prior to her visit I spent greater than 20 minutes reviewing her past medical history,  medications, and prior cardiac tests.

## 2022-02-10 ENCOUNTER — Encounter: Payer: Self-pay | Admitting: General Practice

## 2022-02-10 ENCOUNTER — Ambulatory Visit: Attending: General Practice | Admitting: General Practice

## 2022-02-10 VITALS — BP 106/72 | HR 89 | Ht 72.0 in | Wt 230.6 lb

## 2022-02-10 DIAGNOSIS — I10 Essential (primary) hypertension: Secondary | ICD-10-CM

## 2022-02-10 DIAGNOSIS — R55 Syncope and collapse: Secondary | ICD-10-CM | POA: Diagnosis not present

## 2022-02-10 DIAGNOSIS — I2510 Atherosclerotic heart disease of native coronary artery without angina pectoris: Secondary | ICD-10-CM | POA: Diagnosis not present

## 2022-02-10 DIAGNOSIS — R002 Palpitations: Secondary | ICD-10-CM

## 2022-02-10 NOTE — Patient Instructions (Signed)
Medication Instructions:  The current medical regimen is effective;  continue present plan and medications as directed. Please refer to the Current Medication list given to you today. *If you need a refill on your cardiac medications before your next appointment, please call your pharmacy*   Lab Work: NONE If you have labs (blood work) drawn today and your tests are completely normal, you will receive your results only by:  [place] (if you have MyChart) OR  A paper copy in the mail  If you have any lab test that is abnormal or we need to change your treatment, we will call you to review the results.  Follow-Up: At [facility], you and your health needs are our priority.  As part of our continuing mission to provide you with exceptional heart care, we have created designated Provider Care Teams.  These Care Teams include your primary Cardiologist (physician) and Advanced Practice Providers (APPs -  Physician Assistants and Nurse Practitioners) who all work together to provide you with the care you need, when you need it.  Your next appointment:   6 month(s)  The format for your next appointment:   In Person  Provider:   You may see Carlyle Dolly, MD or Finis Bud, NP in [place]   Other Instructions Please try to avoid these triggers: Do not use any products that have nicotine or tobacco in them. These include cigarettes, e-cigarettes, and chewing tobacco. If you need help quitting, ask your doctor. Eat heart-healthy foods. Talk with your doctor about the right eating plan for you. Exercise regularly as told by your doctor. Stay hydrated Do not drink alcohol, Caffeine or chocolate. Lose weight if you are overweight. Do not use drugs, including cannabis   Important Information About Sugar

## 2022-03-18 DIAGNOSIS — F418 Other specified anxiety disorders: Secondary | ICD-10-CM | POA: Insufficient documentation

## 2022-03-19 ENCOUNTER — Other Ambulatory Visit: Payer: Self-pay | Admitting: Cardiology

## 2022-03-22 ENCOUNTER — Other Ambulatory Visit: Payer: Self-pay | Admitting: General Practice

## 2022-04-15 ENCOUNTER — Encounter: Payer: Self-pay | Admitting: *Deleted

## 2022-05-28 ENCOUNTER — Ambulatory Visit: Admitting: Nurse Practitioner

## 2022-06-04 DIAGNOSIS — R7303 Prediabetes: Secondary | ICD-10-CM | POA: Insufficient documentation

## 2022-06-04 DIAGNOSIS — F1220 Cannabis dependence, uncomplicated: Secondary | ICD-10-CM | POA: Insufficient documentation

## 2022-06-04 DIAGNOSIS — H2510 Age-related nuclear cataract, unspecified eye: Secondary | ICD-10-CM | POA: Insufficient documentation

## 2022-06-09 ENCOUNTER — Other Ambulatory Visit: Payer: Self-pay | Admitting: Cardiology

## 2022-06-11 ENCOUNTER — Encounter: Payer: Self-pay | Admitting: *Deleted

## 2022-06-11 ENCOUNTER — Ambulatory Visit: Admitting: Gastroenterology

## 2022-06-11 ENCOUNTER — Encounter: Payer: Self-pay | Admitting: Gastroenterology

## 2022-06-11 VITALS — BP 109/72 | HR 80 | Temp 98.3°F | Ht 72.0 in | Wt 237.4 lb

## 2022-06-11 DIAGNOSIS — K921 Melena: Secondary | ICD-10-CM | POA: Diagnosis not present

## 2022-06-11 DIAGNOSIS — R1310 Dysphagia, unspecified: Secondary | ICD-10-CM | POA: Diagnosis not present

## 2022-06-11 DIAGNOSIS — Z1211 Encounter for screening for malignant neoplasm of colon: Secondary | ICD-10-CM

## 2022-06-11 DIAGNOSIS — K219 Gastro-esophageal reflux disease without esophagitis: Secondary | ICD-10-CM

## 2022-06-11 DIAGNOSIS — R1013 Epigastric pain: Secondary | ICD-10-CM | POA: Insufficient documentation

## 2022-06-11 MED ORDER — PEG 3350-KCL-NA BICARB-NACL 420 G PO SOLR: 4000.0000 mL | Freq: Once | ORAL | 0 refills | Status: AC

## 2022-06-11 NOTE — H&P (View-Only) (Signed)
GI Office Note    Referring Provider: Celene Squibb, MD Primary Care Physician:  Celene Squibb, MD  Primary Gastroenterologist: Garfield Cornea, MD   Chief Complaint   Chief Complaint  Patient presents with   Gastroesophageal Reflux    States having burning/pain in chest. Worried he may have an ulcer due to having one in the past.    Colonoscopy     History of Present Illness   Henry Webb is a 52 y.o. male presenting today for screening colonoscopy at the request of Dr. Nevada Crane. Patient was seen in 07/2020. Right before that visit, he had cardiac cath requiring 2 stents, placed on ASA/Brilinta for one year. He presented with upper abdominal pain, GERD, need for screening colonoscopy at that time but we had to postpone due to fresh cardiac stents.   Patient states for the past 4-5 weeks he has note pain/pressure substernal region worse with spicy foods. Lasts for seconds at a time. No diaphoresis or SOB. Mild typical heartburn. Symptoms different than the pain he was having prior to receiving cardiac stents. He says his pain has been associated with black stools on a few occasions. Reminds him of when he had bleeding ulcer in the late 90s. He wonders if taking medications late a night and laying down has causes an issue. He has some vague solid food dysphagia. Feels like he has to chew food more thoroughly to get it down. BMs regular. Typically has 2-3 small stools in the am until he feels complete. No brbpr. Stomach has always been sensitive.   Medications   Current Outpatient Medications  Medication Sig Dispense Refill   amLODipine (NORVASC) 5 MG tablet TAKE 1 TABLET(5 MG) BY MOUTH DAILY 30 tablet 3   aspirin EC 81 MG tablet Take 1 tablet (81 mg total) by mouth daily. Swallow whole. 90 tablet 3   cyclobenzaprine (FLEXERIL) 10 MG tablet Take 10 mg by mouth 3 (three) times daily as needed for muscle spasms.     isosorbide mononitrate (IMDUR) 30 MG 24 hr tablet TAKE 1 TABLET(30  MG) BY MOUTH DAILY 90 tablet 3   nitroGLYCERIN (NITROSTAT) 0.4 MG SL tablet ONE TABLET UNDER TONGUE AS NEEDED FOR CHEST PAIN 25 tablet 3   pantoprazole (PROTONIX) 40 MG tablet Take 40 mg by mouth daily.     propranolol ER (INDERAL LA) 160 MG SR capsule TAKE 1 CAPSULE(160 MG) BY MOUTH DAILY 30 capsule 6   rosuvastatin (CRESTOR) 40 MG tablet TAKE 1 TABLET BY MOUTH EVERY DAY 90 tablet 3   sertraline (ZOLOFT) 100 MG tablet Take 100 mg by mouth daily.     temazepam (RESTORIL) 30 MG capsule Take 30 mg by mouth at bedtime.     Venlafaxine HCl 225 MG TB24 Take 225 mg by mouth daily.     No current facility-administered medications for this visit.    Allergies   Allergies as of 06/11/2022 - Review Complete 06/11/2022  Allergen Reaction Noted   Calcium-containing compounds  07/25/2020   Ibuprofen  05/23/2021    Past Medical History   Past Medical History:  Diagnosis Date   Anxiety    CAD (coronary artery disease)    Depression    Migraine    Morbid obesity (HCC)    Palpitations    Sarcoidosis    Tachycardia    Testicular hypofunction     Past Surgical History   Past Surgical History:  Procedure Laterality Date   CORONARY STENT INTERVENTION N/A  07/31/2020   Procedure: CORONARY STENT INTERVENTION;  Surgeon: Martinique, Peter M, MD;  Location: [facility];  Service: Cardiovascular;  Laterality: N/A;   Fusion     INTRAVASCULAR PRESSURE WIRE/FFR STUDY N/A 07/31/2020   Procedure: INTRAVASCULAR PRESSURE WIRE/FFR STUDY;  Surgeon: Martinique, Peter M, MD;  Location: [facility];  Service: Cardiovascular;  Laterality: N/A;   LEFT HEART CATH AND CORONARY ANGIOGRAPHY N/A 07/31/2020   Procedure: LEFT HEART CATH AND CORONARY ANGIOGRAPHY;  Surgeon: Martinique, Peter M, MD;  Location: [facility];  Service: Cardiovascular;  Laterality: N/A;   LITHOTRIPSY      Past Family History   Family History  Problem Relation Age of Onset   Ovarian cancer Mother    Colon cancer Cousin 3    Past  Social History   Social History   Socioeconomic History   Marital status: Married    Spouse name: Not on file   Number of children: Not on file   Years of education: Not on file   Highest education level: Not on file  Occupational History   Occupation: Retired    Comment: Previously in Forensic scientist x 22 years  Tobacco Use   Smoking status: Former   Smokeless tobacco: Current    Types: Snuff  Vaping Use   Vaping Use: Never used  Substance and Sexual Activity   Alcohol use: Yes    Comment: rare    Drug use: Yes    Types: Marijuana   Sexual activity: Yes  Other Topics Concern   Not on file  Social History Narrative   Not on file   Social Determinants of Health   Financial Resource Strain: Not on file  Food Insecurity: Not on file  Transportation Needs: Not on file  Physical Activity: Not on file  Stress: Not on file  Social Connections: Not on file  Intimate Partner Violence: Not on file    Review of Systems   General: Negative for anorexia, weight loss, fever, chills, fatigue, weakness. Eyes: Negative for vision changes.  ENT: Negative for hoarseness, nasal congestion. See hpi CV: Negative for chest pain, angina, palpitations, dyspnea on exertion, peripheral edema.  Respiratory: Negative for dyspnea at rest, dyspnea on exertion, cough, sputum, wheezing.  GI: See history of present illness. GU:  Negative for dysuria, hematuria, urinary incontinence, urinary frequency, nocturnal urination.  MS: Negative for joint pain, low back pain.  Derm: Negative for rash or itching.  Neuro: Negative for weakness, abnormal sensation, seizure, frequent headaches, memory loss,  confusion.  Psych: Negative for anxiety, depression, suicidal ideation, hallucinations.  Endo: Negative for unusual weight change.  Heme: Negative for bruising or bleeding. Allergy: Negative for rash or hives.  Physical Exam   BP 109/72 (BP Location: Right Arm, Patient Position: Sitting, Cuff Size:  Large)   Pulse 80   Temp 98.3 F (36.8 C) (Oral)   Ht 6' (1.829 m)   Wt 237 lb 6.4 oz (107.7 kg)   SpO2 96%   BMI 32.20 kg/m    General: Well-nourished, well-developed in no acute distress.  Head: Normocephalic, atraumatic.   Eyes: Conjunctiva pink, no icterus. Mouth: Oropharyngeal mucosa moist and pink , no lesions erythema or exudate. Neck: Supple without thyromegaly, masses, or lymphadenopathy.  Lungs: Clear to auscultation bilaterally.  Heart: Regular rate and rhythm, no murmurs rubs or gallops.  Abdomen: Bowel sounds are normal,  nondistended, no hepatosplenomegaly or masses,  no abdominal bruits or hernia, no rebound or guarding.  Mild  epigastric tenderness Rectal: not performed Extremities: No lower extremity edema. No clubbing or deformities.  Neuro: Alert and oriented x 4 , grossly normal neurologically.  Skin: Warm and dry, no rash or jaundice.   Psych: Alert and cooperative, normal mood and affect.  Labs   Lab Results  Component Value Date   CREATININE 0.91 12/20/2021   BUN 17 12/20/2021   NA 139 12/20/2021   K 5.1 12/20/2021   CL 99 12/20/2021   CO2 28 12/20/2021   Lab Results  Component Value Date   WBC 8.7 12/20/2021   HGB 15.9 12/20/2021   HCT 47.2 12/20/2021   MCV 87 12/20/2021   PLT 344 12/20/2021      Imaging Studies   No results found.  Assessment   GERD/dysphagia/epigastric pain: substernal pain worse with spicy foods. Sensation of solid food dysphagia, mild. Reported black stools about four weeks ago associated with his pain. He takes ASA '81mg'$  daily along with daily PPI. No other NSAIDs. Remote history of bleeding ulcer. Recommend EGD+/-ED in near future to rule out complicated GERD, ulcers.   Screening colonoscopy: no prior colonoscopy. No lower GI symptoms.   PLAN   Colonoscopy/EGD/possible ED in near future. ASA 3.  I have discussed the risks, alternatives, benefits with regards to but not limited to the risk of reaction to  medication, bleeding, infection, perforation and the patient is agreeable to proceed. Written consent to be obtained. Monitor for further black stools, worsening symptoms. Patient to call if any problems.  Continue pantoprazole '40mg'$  daily.    Laureen Ochs. Bobby Rumpf, [place], [facility]

## 2022-06-11 NOTE — Progress Notes (Signed)
GI Office Note    Referring Provider: Celene Squibb, MD Primary Care Physician:  Celene Squibb, MD  Primary Gastroenterologist: Garfield Cornea, MD   Chief Complaint   Chief Complaint  Patient presents with   Gastroesophageal Reflux    States having burning/pain in chest. Worried he may have an ulcer due to having one in the past.    Colonoscopy     History of Present Illness   Henry Webb is a 52 y.o. male presenting today for screening colonoscopy at the request of Dr. Nevada Crane. Patient was seen in 07/2020. Right before that visit, he had cardiac cath requiring 2 stents, placed on ASA/Brilinta for one year. He presented with upper abdominal pain, GERD, need for screening colonoscopy at that time but we had to postpone due to fresh cardiac stents.   Patient states for the past 4-5 weeks he has note pain/pressure substernal region worse with spicy foods. Lasts for seconds at a time. No diaphoresis or SOB. Mild typical heartburn. Symptoms different than the pain he was having prior to receiving cardiac stents. He says his pain has been associated with black stools on a few occasions. Reminds him of when he had bleeding ulcer in the late 90s. He wonders if taking medications late a night and laying down has causes an issue. He has some vague solid food dysphagia. Feels like he has to chew food more thoroughly to get it down. BMs regular. Typically has 2-3 small stools in the am until he feels complete. No brbpr. Stomach has always been sensitive.   Medications   Current Outpatient Medications  Medication Sig Dispense Refill   amLODipine (NORVASC) 5 MG tablet TAKE 1 TABLET(5 MG) BY MOUTH DAILY 30 tablet 3   aspirin EC 81 MG tablet Take 1 tablet (81 mg total) by mouth daily. Swallow whole. 90 tablet 3   cyclobenzaprine (FLEXERIL) 10 MG tablet Take 10 mg by mouth 3 (three) times daily as needed for muscle spasms.     isosorbide mononitrate (IMDUR) 30 MG 24 hr tablet TAKE 1 TABLET(30  MG) BY MOUTH DAILY 90 tablet 3   nitroGLYCERIN (NITROSTAT) 0.4 MG SL tablet ONE TABLET UNDER TONGUE AS NEEDED FOR CHEST PAIN 25 tablet 3   pantoprazole (PROTONIX) 40 MG tablet Take 40 mg by mouth daily.     propranolol ER (INDERAL LA) 160 MG SR capsule TAKE 1 CAPSULE(160 MG) BY MOUTH DAILY 30 capsule 6   rosuvastatin (CRESTOR) 40 MG tablet TAKE 1 TABLET BY MOUTH EVERY DAY 90 tablet 3   sertraline (ZOLOFT) 100 MG tablet Take 100 mg by mouth daily.     temazepam (RESTORIL) 30 MG capsule Take 30 mg by mouth at bedtime.     Venlafaxine HCl 225 MG TB24 Take 225 mg by mouth daily.     No current facility-administered medications for this visit.    Allergies   Allergies as of 06/11/2022 - Review Complete 06/11/2022  Allergen Reaction Noted   Calcium-containing compounds  07/25/2020   Ibuprofen  05/23/2021    Past Medical History   Past Medical History:  Diagnosis Date   Anxiety    CAD (coronary artery disease)    Depression    Migraine    Morbid obesity (HCC)    Palpitations    Sarcoidosis    Tachycardia    Testicular hypofunction     Past Surgical History   Past Surgical History:  Procedure Laterality Date   CORONARY STENT INTERVENTION N/A  07/31/2020   Procedure: CORONARY STENT INTERVENTION;  Surgeon: Martinique, Peter M, MD;  Location: [place] [facility];  Service: Cardiovascular;  Laterality: N/A;   Fusion     INTRAVASCULAR PRESSURE WIRE/FFR STUDY N/A 07/31/2020   Procedure: INTRAVASCULAR PRESSURE WIRE/FFR STUDY;  Surgeon: Martinique, Peter M, MD;  Location: [place] [facility];  Service: Cardiovascular;  Laterality: N/A;   LEFT HEART CATH AND CORONARY ANGIOGRAPHY N/A 07/31/2020   Procedure: LEFT HEART CATH AND CORONARY ANGIOGRAPHY;  Surgeon: Martinique, Peter M, MD;  Location: [place] [facility];  Service: Cardiovascular;  Laterality: N/A;   LITHOTRIPSY      Past Family History   Family History  Problem Relation Age of Onset   Ovarian cancer Mother    Colon cancer Cousin 87    Past  Social History   Social History   Socioeconomic History   Marital status: Married    Spouse name: Not on file   Number of children: Not on file   Years of education: Not on file   Highest education level: Not on file  Occupational History   Occupation: Retired    Comment: Previously in Forensic scientist x 22 years  Tobacco Use   Smoking status: Former   Smokeless tobacco: Current    Types: Snuff  Vaping Use   Vaping Use: Never used  Substance and Sexual Activity   Alcohol use: Yes    Comment: rare    Drug use: Yes    Types: Marijuana   Sexual activity: Yes  Other Topics Concern   Not on file  Social History Narrative   Not on file   Social Determinants of Health   Financial Resource Strain: Not on file  Food Insecurity: Not on file  Transportation Needs: Not on file  Physical Activity: Not on file  Stress: Not on file  Social Connections: Not on file  Intimate Partner Violence: Not on file    Review of Systems   General: Negative for anorexia, weight loss, fever, chills, fatigue, weakness. Eyes: Negative for vision changes.  ENT: Negative for hoarseness, nasal congestion. See hpi CV: Negative for chest pain, angina, palpitations, dyspnea on exertion, peripheral edema.  Respiratory: Negative for dyspnea at rest, dyspnea on exertion, cough, sputum, wheezing.  GI: See history of present illness. GU:  Negative for dysuria, hematuria, urinary incontinence, urinary frequency, nocturnal urination.  MS: Negative for joint pain, low back pain.  Derm: Negative for rash or itching.  Neuro: Negative for weakness, abnormal sensation, seizure, frequent headaches, memory loss,  confusion.  Psych: Negative for anxiety, depression, suicidal ideation, hallucinations.  Endo: Negative for unusual weight change.  Heme: Negative for bruising or bleeding. Allergy: Negative for rash or hives.  Physical Exam   BP 109/72 (BP Location: Right Arm, Patient Position: Sitting, Cuff Size:  Large)   Pulse 80   Temp 98.3 F (36.8 C) (Oral)   Ht 6' (1.829 m)   Wt 237 lb 6.4 oz (107.7 kg)   SpO2 96%   BMI 32.20 kg/m    General: Well-nourished, well-developed in no acute distress.  Head: Normocephalic, atraumatic.   Eyes: Conjunctiva pink, no icterus. Mouth: Oropharyngeal mucosa moist and pink , no lesions erythema or exudate. Neck: Supple without thyromegaly, masses, or lymphadenopathy.  Lungs: Clear to auscultation bilaterally.  Heart: Regular rate and rhythm, no murmurs rubs or gallops.  Abdomen: Bowel sounds are normal,  nondistended, no hepatosplenomegaly or masses,  no abdominal bruits or hernia, no rebound or guarding.  Mild  epigastric tenderness Rectal: not performed Extremities: No lower extremity edema. No clubbing or deformities.  Neuro: Alert and oriented x 4 , grossly normal neurologically.  Skin: Warm and dry, no rash or jaundice.   Psych: Alert and cooperative, normal mood and affect.  Labs   Lab Results  Component Value Date   CREATININE 0.91 12/20/2021   BUN 17 12/20/2021   NA 139 12/20/2021   K 5.1 12/20/2021   CL 99 12/20/2021   CO2 28 12/20/2021   Lab Results  Component Value Date   WBC 8.7 12/20/2021   HGB 15.9 12/20/2021   HCT 47.2 12/20/2021   MCV 87 12/20/2021   PLT 344 12/20/2021      Imaging Studies   No results found.  Assessment   GERD/dysphagia/epigastric pain: substernal pain worse with spicy foods. Sensation of solid food dysphagia, mild. Reported black stools about four weeks ago associated with his pain. He takes ASA '81mg'$  daily along with daily PPI. No other NSAIDs. Remote history of bleeding ulcer. Recommend EGD+/-ED in near future to rule out complicated GERD, ulcers.   Screening colonoscopy: no prior colonoscopy. No lower GI symptoms.   PLAN   Colonoscopy/EGD/possible ED in near future. ASA 3.  I have discussed the risks, alternatives, benefits with regards to but not limited to the risk of reaction to  medication, bleeding, infection, perforation and the patient is agreeable to proceed. Written consent to be obtained. Monitor for further black stools, worsening symptoms. Patient to call if any problems.  Continue pantoprazole '40mg'$  daily.    Laureen Ochs. Bobby Rumpf, [place], [facility]

## 2022-06-11 NOTE — Patient Instructions (Addendum)
Upper endoscopy and colonoscopy to be scheduled. See separate instructions. If you have further black stools, please let us know.  Continue pantoprazole '40mg'$  daily.   It was a pleasure to see you today. I want to create trusting relationships with patients and provide genuine, compassionate, and quality care. I truly value your feedback, so please be on the lookout for a survey regarding your visit with me today. I appreciate your time in completing this!

## 2022-06-12 ENCOUNTER — Encounter: Payer: Self-pay | Admitting: *Deleted

## 2022-06-23 ENCOUNTER — Ambulatory Visit: Attending: Cardiology | Admitting: Cardiology

## 2022-06-23 ENCOUNTER — Encounter: Payer: Self-pay | Admitting: Cardiology

## 2022-06-23 ENCOUNTER — Other Ambulatory Visit: Payer: Self-pay | Admitting: Cardiology

## 2022-06-23 VITALS — BP 119/76 | HR 85 | Ht 72.0 in | Wt 237.0 lb

## 2022-06-23 DIAGNOSIS — I2510 Atherosclerotic heart disease of native coronary artery without angina pectoris: Secondary | ICD-10-CM

## 2022-06-23 DIAGNOSIS — E782 Mixed hyperlipidemia: Secondary | ICD-10-CM

## 2022-06-23 DIAGNOSIS — R002 Palpitations: Secondary | ICD-10-CM

## 2022-06-23 NOTE — Patient Instructions (Signed)
Medication Instructions:  Continue all current medications.  Labwork: none  Testing/Procedures: none  Follow-Up: 6 months   Any Other Special Instructions Will Be Listed Below (If Applicable).  If you need a refill on your cardiac medications before your next appointment, please call your pharmacy.  

## 2022-06-23 NOTE — Progress Notes (Signed)
Clinical Summary Henry Webb is a 52 y.o.male seen today for follow up of the following medical problems.    1. Palpitations - symptoms on and off for several years - 04/2019 episodes 1% PVC burden. 02/2019 [facility] PET scan: no evidence of cardiac sarcoid   - 12/2021 monitor: rare ectopy - no recent palpitations     2. Chest pain/CAD - sharp pain midchest, come activity. Mild pain, resolves with rest. No other associated symptoms - symptoms started 3-4 months ago. No change in pattern. CAD: HL, former 8 years tobacco, father CAD in 64s, paternal grandather heart diease later 83s,   - 02/2019 PET stress at [facility], some coronary calcifications, no ischemia. Of note was not having chest pains at that time, test ordered to evaluate for cardiac sarcoid which was negative.      04/2020 GXT: no ST/T changes, [facility] treadmill score 2.30 May 2020 nuclear stress: inferolateral infarct with moderate ischemia   07/2020 cath: LM patent, LAD distal 75%, D1 99%, D2 70%, ramus 99%, LCX mid 99%, OM1 70%, RCA 25%, RPDA 90%. Received DES to LCX and OM. Other disease small vessel or diffuse distal disease     - episode 1 month ago - 6-7/10 pain midchest. Felt like a balloon filling up, burning pain. Occurred while in the front yard. No other associated symptoms. . Some increased belching, increased flatulence. 5 episodes 10 seconds a piece and resolved.  - upper and lower endoscopy coming up.  - does heavy yardwork without exertional symptoms.      3. Sarcoid - involvement of eye, lungs, liver, kidney, lymph nodes, and neuro - followed by rheumatology     4. White coat HTN - compliant with meds   - home bp's 120s/80s     5. Syncope - isolated episode, thought to be vasovagal - no recurrent episodes    6. Hematuria/recurrent kidney stones - followed by urology       7. Hyperlipidemia  - recent labs with pcp 04/2021 : 04/2021 TC 159 TG 133 HDL 38 LDL 97    Jan 2024: TC 135 TG 85  HDL 42 LDL 77 - discussed zetia, wants to work on diet.    Just had new grandson by his daughter who lives in [place], just turned 6 months.  Past Medical History:  Diagnosis Date   Anxiety    CAD (coronary artery disease)    Depression    Migraine    Morbid obesity (HCC)    Palpitations    Sarcoidosis    Tachycardia    Testicular hypofunction      Allergies  Allergen Reactions   Calcium-Containing Compounds     Flares sarcoidosis    Ibuprofen      Current Outpatient Medications  Medication Sig Dispense Refill   amLODipine (NORVASC) 5 MG tablet TAKE 1 TABLET(5 MG) BY MOUTH DAILY 30 tablet 3   aspirin EC 81 MG tablet Take 1 tablet (81 mg total) by mouth daily. Swallow whole. 90 tablet 3   cyclobenzaprine (FLEXERIL) 10 MG tablet Take 10 mg by mouth 3 (three) times daily as needed for muscle spasms.     isosorbide mononitrate (IMDUR) 30 MG 24 hr tablet TAKE 1 TABLET(30 MG) BY MOUTH DAILY 90 tablet 3   nitroGLYCERIN (NITROSTAT) 0.4 MG SL tablet ONE TABLET UNDER TONGUE AS NEEDED FOR CHEST PAIN 25 tablet 3   pantoprazole (PROTONIX) 40 MG tablet Take 40 mg by mouth daily.  propranolol ER (INDERAL LA) 160 MG SR capsule TAKE 1 CAPSULE(160 MG) BY MOUTH DAILY 30 capsule 6   rosuvastatin (CRESTOR) 40 MG tablet TAKE 1 TABLET BY MOUTH EVERY DAY 90 tablet 3   sertraline (ZOLOFT) 100 MG tablet Take 100 mg by mouth daily.     temazepam (RESTORIL) 30 MG capsule Take 30 mg by mouth at bedtime.     Venlafaxine HCl 225 MG TB24 Take 225 mg by mouth daily.     No current facility-administered medications for this visit.     Past Surgical History:  Procedure Laterality Date   CORONARY STENT INTERVENTION N/A 07/31/2020   Procedure: CORONARY STENT INTERVENTION;  Surgeon: Martinique, Peter M, MD;  Location: [facility];  Service: Cardiovascular;  Laterality: N/A;   Fusion     INTRAVASCULAR PRESSURE WIRE/FFR STUDY N/A 07/31/2020   Procedure: INTRAVASCULAR PRESSURE WIRE/FFR STUDY;  Surgeon:  Martinique, Peter M, MD;  Location: [facility];  Service: Cardiovascular;  Laterality: N/A;   LEFT HEART CATH AND CORONARY ANGIOGRAPHY N/A 07/31/2020   Procedure: LEFT HEART CATH AND CORONARY ANGIOGRAPHY;  Surgeon: Martinique, Peter M, MD;  Location: [facility];  Service: Cardiovascular;  Laterality: N/A;   LITHOTRIPSY       Allergies  Allergen Reactions   Calcium-Containing Compounds     Flares sarcoidosis    Ibuprofen       Family History  Problem Relation Age of Onset   Ovarian cancer Mother    Colon cancer Cousin 34     Social History Mr. Lean reports that he has quit smoking. His smokeless tobacco use includes snuff. Mr. Bohlman reports current alcohol use.   Review of Systems CONSTITUTIONAL: No weight loss, fever, chills, weakness or fatigue.  HEENT: Eyes: No visual loss, blurred vision, double vision or yellow sclerae.No hearing loss, sneezing, congestion, runny nose or sore throat.  SKIN: No rash or itching.  CARDIOVASCULAR: per hpi RESPIRATORY: No shortness of breath, cough or sputum.  GASTROINTESTINAL: No anorexia, nausea, vomiting or diarrhea. No abdominal pain or blood.  GENITOURINARY: No burning on urination, no polyuria NEUROLOGICAL: No headache, dizziness, syncope, paralysis, ataxia, numbness or tingling in the extremities. No change in bowel or bladder control.  MUSCULOSKELETAL: No muscle, back pain, joint pain or stiffness.  LYMPHATICS: No enlarged nodes. No history of splenectomy.  PSYCHIATRIC: No history of depression or anxiety.  ENDOCRINOLOGIC: No reports of sweating, cold or heat intolerance. No polyuria or polydipsia.  Marland Kitchen   Physical Examination Today's Vitals   06/23/22 0908  BP: 119/76  Pulse: 85  SpO2: 97%  Weight: 237 lb (107.5 kg)  Height: 6' (1.829 m)   Body mass index is 32.14 kg/m.  Gen: resting comfortably, no acute distress HEENT: no scleral icterus, pupils equal round and reactive, no palptable cervical adenopathy,  CV:  RRR, no m/r/g, no jvd Resp: Clear to auscultation bilaterally GI: abdomen is soft, non-tender, non-distended, normal bowel sounds, no hepatosplenomegaly MSK: extremities are warm, no edema.  Skin: warm, no rash Neuro:  no focal deficits Psych: appropriate affect   Diagnostic Studies 04/2019 event monitor 72 hour monitor Min HR 76, Max HR 147, Avg HR 105 Rare supraventricular ectopy in the form of isolated PACs Occasional ventricular ectopy. 1% PVC burden. In the form of isolated PVCs, couplets, bigeminy, trigeminy. No NSVT or VT.     02/2019 Cardiac PET [facility] Impressions: - No evidence of cardiac sarcoidosis - Perfusion: Normal perfusion in the left ventricular myocardium on resting images.  Metabolism: No abnormal FDG uptake noted.  - Global systolic function is mildly reduced. - Coronary calcifications are noted   Jan 2022 nuclear stress There was no ST segment deviation noted during stress. 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 This is an intermediate risk study. The left ventricular ejection fraction is normal (55-65%).      Assessment and Plan  1. PVCs/Palpitations - no symptoms, continue current meds       2. CAD - Has some residual small vessel/diffuse distal disease managed medically, room to titrate antianginals in the future if needed - no recent cardiac symptoms, continue current meds   3. Hyperlipidemia - LDL above goal, discussed adding zetia to his crestor '40mg'$  daily. He is in favor of dietary changes at this time, will hold on starting zetia   F/u 6 months     Arnoldo Lenis, M.D.

## 2022-06-30 ENCOUNTER — Telehealth: Payer: Self-pay | Admitting: *Deleted

## 2022-06-30 ENCOUNTER — Encounter (HOSPITAL_COMMUNITY)
Admission: RE | Admit: 2022-06-30 | Discharge: 2022-06-30 | Disposition: A | Discharge status: Home / Self Care | Source: Ambulatory Visit | Attending: Internal Medicine | Admitting: Internal Medicine

## 2022-06-30 ENCOUNTER — Encounter (HOSPITAL_COMMUNITY): Payer: Self-pay

## 2022-06-30 ENCOUNTER — Other Ambulatory Visit: Payer: Self-pay

## 2022-06-30 HISTORY — DX: Calculus of kidney: N20.0

## 2022-06-30 HISTORY — DX: Personal history of other diseases of the musculoskeletal system and connective tissue: Z87.39

## 2022-06-30 HISTORY — DX: Gastro-esophageal reflux disease without esophagitis: K21.9

## 2022-06-30 HISTORY — DX: Fibromyalgia: M79.7

## 2022-06-30 HISTORY — DX: Unspecified osteoarthritis, unspecified site: M19.90

## 2022-06-30 NOTE — Telephone Encounter (Signed)
Pt left vm stating that he didn't get the prep for his upcoming procedure.  Advised pt that rx was sent into pharmacy and it may be on hold but it's at the pharmacy. Verbalized understanding.

## 2022-07-02 ENCOUNTER — Encounter (HOSPITAL_COMMUNITY): Payer: Self-pay | Admitting: Internal Medicine

## 2022-07-02 ENCOUNTER — Encounter (HOSPITAL_COMMUNITY)
Admission: RE | Disposition: A | Discharge status: Home / Self Care | Payer: Self-pay | Source: Home / Self Care | Attending: Internal Medicine

## 2022-07-02 ENCOUNTER — Ambulatory Visit (HOSPITAL_BASED_OUTPATIENT_CLINIC_OR_DEPARTMENT_OTHER): Admitting: Anesthesiology

## 2022-07-02 ENCOUNTER — Ambulatory Visit (HOSPITAL_COMMUNITY): Admitting: Anesthesiology

## 2022-07-02 ENCOUNTER — Ambulatory Visit (HOSPITAL_COMMUNITY)
Admission: RE | Admit: 2022-07-02 | Discharge: 2022-07-02 | Disposition: A | Discharge status: Home / Self Care | Attending: Internal Medicine | Admitting: Internal Medicine

## 2022-07-02 DIAGNOSIS — D122 Benign neoplasm of ascending colon: Secondary | ICD-10-CM

## 2022-07-02 DIAGNOSIS — I25119 Atherosclerotic heart disease of native coronary artery with unspecified angina pectoris: Secondary | ICD-10-CM | POA: Insufficient documentation

## 2022-07-02 DIAGNOSIS — D124 Benign neoplasm of descending colon: Secondary | ICD-10-CM | POA: Diagnosis not present

## 2022-07-02 DIAGNOSIS — Z79899 Other long term (current) drug therapy: Secondary | ICD-10-CM | POA: Diagnosis not present

## 2022-07-02 DIAGNOSIS — Z87891 Personal history of nicotine dependence: Secondary | ICD-10-CM | POA: Diagnosis not present

## 2022-07-02 DIAGNOSIS — F418 Other specified anxiety disorders: Secondary | ICD-10-CM | POA: Diagnosis not present

## 2022-07-02 DIAGNOSIS — K5730 Diverticulosis of large intestine without perforation or abscess without bleeding: Secondary | ICD-10-CM | POA: Insufficient documentation

## 2022-07-02 DIAGNOSIS — K449 Diaphragmatic hernia without obstruction or gangrene: Secondary | ICD-10-CM | POA: Diagnosis not present

## 2022-07-02 DIAGNOSIS — K219 Gastro-esophageal reflux disease without esophagitis: Secondary | ICD-10-CM | POA: Insufficient documentation

## 2022-07-02 DIAGNOSIS — Z7982 Long term (current) use of aspirin: Secondary | ICD-10-CM | POA: Diagnosis not present

## 2022-07-02 DIAGNOSIS — Z1211 Encounter for screening for malignant neoplasm of colon: Secondary | ICD-10-CM

## 2022-07-02 DIAGNOSIS — R1310 Dysphagia, unspecified: Secondary | ICD-10-CM | POA: Diagnosis not present

## 2022-07-02 DIAGNOSIS — I10 Essential (primary) hypertension: Secondary | ICD-10-CM | POA: Diagnosis not present

## 2022-07-02 HISTORY — PX: COLONOSCOPY WITH PROPOFOL: SHX5780

## 2022-07-02 HISTORY — PX: MALONEY DILATION: SHX5535

## 2022-07-02 HISTORY — PX: POLYPECTOMY: SHX5525

## 2022-07-02 HISTORY — PX: ESOPHAGOGASTRODUODENOSCOPY (EGD) WITH PROPOFOL: SHX5813

## 2022-07-02 SURGERY — COLONOSCOPY WITH PROPOFOL
Anesthesia: General

## 2022-07-02 MED ORDER — LACTATED RINGERS IV SOLN: INTRAVENOUS | Status: DC | PRN

## 2022-07-02 MED ORDER — EPHEDRINE SULFATE (PRESSORS) 50 MG/ML IJ SOLN
INTRAMUSCULAR | Status: DC | PRN
  Administered 2022-07-02: 10 mg via INTRAVENOUS
  Administered 2022-07-02: 5 mg via INTRAVENOUS
  Administered 2022-07-02: 10 mg via INTRAVENOUS

## 2022-07-02 MED ORDER — SODIUM CHLORIDE 0.9 % IV SOLN: INTRAVENOUS | Status: DC

## 2022-07-02 MED ORDER — LIDOCAINE HCL (CARDIAC) PF 100 MG/5ML IV SOSY
PREFILLED_SYRINGE | INTRAVENOUS | Status: DC | PRN
  Administered 2022-07-02: 100 mg via INTRAVENOUS

## 2022-07-02 MED ORDER — PHENYLEPHRINE HCL (PRESSORS) 10 MG/ML IV SOLN
INTRAVENOUS | Status: DC | PRN
  Administered 2022-07-02 (×5): 160 ug via INTRAVENOUS

## 2022-07-02 MED ORDER — PROPOFOL 500 MG/50ML IV EMUL
INTRAVENOUS | Status: DC | PRN
  Administered 2022-07-02: 150 ug/kg/min via INTRAVENOUS

## 2022-07-02 MED ORDER — STERILE WATER FOR IRRIGATION IR SOLN
Status: DC | PRN
  Administered 2022-07-02: 100 mL

## 2022-07-02 MED ORDER — PROPOFOL 10 MG/ML IV BOLUS
INTRAVENOUS | Status: DC | PRN
  Administered 2022-07-02: 100 mg via INTRAVENOUS
  Administered 2022-07-02 (×2): 50 mg via INTRAVENOUS

## 2022-07-02 NOTE — Discharge Instructions (Signed)
Colonoscopy Discharge Instructions  Read the instructions outlined below and refer to this sheet in the next few weeks. These discharge instructions provide you with general information on caring for yourself after you leave the hospital. Your doctor may also give you specific instructions. While your treatment has been planned according to the most current medical practices available, unavoidable complications occasionally occur. If you have any problems or questions after discharge, call Dr. Gala Romney at [phone]. ACTIVITY You may resume your regular activity, but move at a slower pace for the next 24 hours.  Take frequent rest periods for the next 24 hours.  Walking will help get rid of the air and reduce the bloated feeling in your belly (abdomen).  No driving for 24 hours (because of the medicine (anesthesia) used during the test).   Do not sign any important legal documents or operate any machinery for 24 hours (because of the anesthesia used during the test).  NUTRITION Drink plenty of fluids.  You may resume your normal diet as instructed by your doctor.  Begin with a light meal and progress to your normal diet. Heavy or fried foods are harder to digest and may make you feel sick to your stomach (nauseated).  Avoid alcoholic beverages for 24 hours or as instructed.  MEDICATIONS You may resume your normal medications unless your doctor tells you otherwise.  WHAT YOU CAN EXPECT TODAY Some feelings of bloating in the abdomen.  Passage of more gas than usual.  Spotting of blood in your stool or on the toilet paper.  IF YOU HAD POLYPS REMOVED DURING THE COLONOSCOPY: No aspirin products for 7 days or as instructed.  No alcohol for 7 days or as instructed.  Eat a soft diet for the next 24 hours.  FINDING OUT THE RESULTS OF YOUR TEST Not all test results are available during your visit. If your test results are not back during the visit, make an appointment with your caregiver to find out the  results. Do not assume everything is normal if you have not heard from your caregiver or the medical facility. It is important for you to follow up on all of your test results.  SEEK IMMEDIATE MEDICAL ATTENTION IF: You have more than a spotting of blood in your stool.  Your belly is swollen (abdominal distention).  You are nauseated or vomiting.  You have a temperature over 101.  You have abdominal pain or discomfort that is severe or gets worse throughout the day.    EGD Discharge instructions Please read the instructions outlined below and refer to this sheet in the next few weeks. These discharge instructions provide you with general information on caring for yourself after you leave the hospital. Your doctor may also give you specific instructions. While your treatment has been planned according to the most current medical practices available, unavoidable complications occasionally occur. If you have any problems or questions after discharge, please call your doctor. ACTIVITY You may resume your regular activity but move at a slower pace for the next 24 hours.  Take frequent rest periods for the next 24 hours.  Walking will help expel (get rid of) the air and reduce the bloated feeling in your abdomen.  No driving for 24 hours (because of the anesthesia (medicine) used during the test).  You may shower.  Do not sign any important legal documents or operate any machinery for 24 hours (because of the anesthesia used during the test).  NUTRITION Drink plenty of fluids.  You  may resume your normal diet.  Begin with a light meal and progress to your normal diet.  Avoid alcoholic beverages for 24 hours or as instructed by your caregiver.  MEDICATIONS You may resume your normal medications unless your caregiver tells you otherwise.  WHAT YOU CAN EXPECT TODAY You may experience abdominal discomfort such as a feeling of fullness or "gas" pains.  FOLLOW-UP Your doctor will discuss the results  of your test with you.  SEEK IMMEDIATE MEDICAL ATTENTION IF ANY OF THE FOLLOWING OCCUR: Excessive nausea (feeling sick to your stomach) and/or vomiting.  Severe abdominal pain and distention (swelling).  Trouble swallowing.  Temperature over 101 F (37.8 C).  Rectal bleeding or vomiting of blood.       Small polyps removed your colon today  Your esophagus was stretched  Colon polyp and diverticulosis information provided  Further recommendations to follow pending review of pathology report  Office visit with [place] in 3 months  At patient request, I called Barnetta Chapel at [phone] findings and recommendations

## 2022-07-02 NOTE — Op Note (Signed)
[facility] Patient Name: Henry Webb Procedure Date: 07/02/2022 9:04 AM MRN: [id number] Date of Birth: 01-10-1971 Attending MD: Norvel Richards , MD, [id number] CSN: [id number] Age: 52 Admit Type: Outpatient Procedure:                Colonoscopy Indications:              Screening for colorectal malignant neoplasm Providers:                Norvel Richards, MD, Caprice Kluver, Raphael Gibney, Technician Referring MD:              Medicines:                Propofol per Anesthesia Complications:            No immediate complications. Estimated Blood Loss:     Estimated blood loss was minimal. Procedure:                Pre-Anesthesia Assessment:                           - Prior to the procedure, a History and Physical                            was performed, and patient medications and                            allergies were reviewed. The patient's tolerance of                            previous anesthesia was also reviewed. The risks                            and benefits of the procedure and the sedation                            options and risks were discussed with the patient.                            All questions were answered, and informed consent                            was obtained. Prior Anticoagulants: The patient has                            taken no anticoagulant or antiplatelet agents. ASA                            Grade Assessment: III - A patient with severe                            systemic disease. After reviewing the risks and  benefits, the patient was deemed in satisfactory                            condition to undergo the procedure.                           After obtaining informed consent, the colonoscope                            was passed under direct vision. Throughout the                            procedure, the patient's blood pressure, pulse, and                             oxygen saturations were monitored continuously. The                            [id number]) scope was introduced through the                            anus and advanced to the the cecum, identified by                            appendiceal orifice and ileocecal valve. The                            colonoscopy was performed without difficulty. The                            patient tolerated the procedure well. The quality                            of the bowel preparation was adequate. The                            ileocecal valve, appendiceal orifice, and rectum                            were photographed. Scope In: 9:40:42 AM Scope Out: 9:59:08 AM Scope Withdrawal Time: 0 hours 11 minutes 12 seconds  Total Procedure Duration: 0 hours 18 minutes 26 seconds  Findings:      The perianal and digital rectal examinations were normal.      Scattered small-mouthed diverticula were found in the sigmoid colon.      Four semi-pedunculated polyps were found in the descending colon and       ascending colon. The polyps were 3 to 5 mm in size. These polyps were       removed with a cold snare. Resection and retrieval were complete.       Estimated blood loss: none.      The exam was otherwise without abnormality on direct and retroflexion       views. Impression:               - Diverticulosis in the sigmoid colon.                           -  Four 3 to 5 mm polyps in the descending colon and                            in the ascending colon, removed with a cold snare.                            Resected and retrieved.                           - The examination was otherwise normal on direct                            and retroflexion views. Moderate Sedation:      Moderate (conscious) sedation was personally administered by an       anesthesia professional. The following parameters were monitored: oxygen       saturation, heart rate, blood pressure, respiratory rate, EKG, adequacy        of pulmonary ventilation, and response to care. Recommendation:           - Patient has a contact number available for                            emergencies. The signs and symptoms of potential                            delayed complications were discussed with the                            patient. Return to normal activities tomorrow.                            Written discharge instructions were provided to the                            patient.                           - Advance diet as tolerated.                           - Continue present medications.                           - Repeat colonoscopy date to be determined after                            pending pathology results are reviewed for                            surveillance.                           - Return to GI office in 3 months. Procedure Code(s):        --- Professional ---  45385, Colonoscopy, flexible; with removal of                            tumor(s), polyp(s), or other lesion(s) by snare                            technique Diagnosis Code(s):        --- Professional ---                           Z12.11, Encounter for screening for malignant                            neoplasm of colon                           D12.4, Benign neoplasm of descending colon                           D12.2, Benign neoplasm of ascending colon                           K57.30, Diverticulosis of large intestine without                            perforation or abscess without bleeding CPT copyright 2022 American [employer] Association. All rights reserved. The codes documented in this report are preliminary and upon coder review may  be revised to meet current compliance requirements. Cristopher Estimable. Ryatt Corsino, MD Norvel Richards, MD 07/02/2022 10:13:12 AM This report has been signed electronically. Number of Addenda: 0

## 2022-07-02 NOTE — Interval H&P Note (Signed)
History and Physical Interval Note:  07/02/2022 9:07 AM  Henry Webb  has presented today for surgery, with the diagnosis of melena,dysphagia,gerd,epigastric pain,screening colonoscopy.  The various methods of treatment have been discussed with the patient and family. After consideration of risks, benefits and other options for treatment, the patient has consented to  Procedure(s) with comments: COLONOSCOPY WITH PROPOFOL (N/A) - 9:15 am ESOPHAGOGASTRODUODENOSCOPY (EGD) WITH PROPOFOL (N/A) MALONEY DILATION (N/A) as a surgical intervention.  The patient's history has been reviewed, patient examined, no change in status, stable for surgery.  I have reviewed the patient's chart and labs.  Questions were answered to the patient's satisfaction.     Henry Webb     No change.  Although Protonix has helped her reflux quite a bit.  Plan for EGD with esophageal dilation as feasible/appropriate today and first-ever screening colonoscopy  The risks, benefits, limitations, imponderables and alternatives regarding both EGD and colonoscopy have been reviewed with the patient. Questions have been answered. All parties agreeable.

## 2022-07-02 NOTE — Transfer of Care (Signed)
Immediate Anesthesia Transfer of Care Note  Patient: Henry Webb, clinical  Procedure(s) Performed: COLONOSCOPY WITH PROPOFOL ESOPHAGOGASTRODUODENOSCOPY (EGD) WITH PROPOFOL MALONEY DILATION POLYPECTOMY  Patient Location: Short Stay  Anesthesia Type:General  Level of Consciousness: awake, alert , oriented, and patient cooperative  Airway & Oxygen Therapy: Patient Spontanous Breathing  Post-op Assessment: Report given to RN, Post -op Vital signs reviewed and stable, and Patient moving all extremities X 4  Post vital signs: Reviewed and stable  Last Vitals:  Vitals Value Taken Time  BP    Temp    Pulse    Resp    SpO2      Last Pain:  Vitals:   07/02/22 0916  PainSc: 0-No pain         Complications: No notable events documented.

## 2022-07-02 NOTE — Anesthesia Preprocedure Evaluation (Signed)
Anesthesia Evaluation  Patient identified by MRN, date of birth, ID band Patient awake    Reviewed: Allergy & Precautions, H&P , NPO status , Patient's Chart, lab work & pertinent test results, reviewed documented beta blocker date and time   Airway Mallampati: II  TM Distance: >3 FB Neck ROM: full    Dental no notable dental hx.    Pulmonary neg pulmonary ROS, Patient abstained from smoking., former smoker   Pulmonary exam normal breath sounds clear to auscultation       Cardiovascular Exercise Tolerance: Good hypertension, + angina  + CAD  negative cardio ROS  Rhythm:regular Rate:Normal     Neuro/Psych  Headaches PSYCHIATRIC DISORDERS Anxiety Depression     Neuromuscular disease negative neurological ROS  negative psych ROS   GI/Hepatic negative GI ROS, Neg liver ROS,GERD  ,,  Endo/Other  negative endocrine ROS    Renal/GU Renal diseasenegative Renal ROS  negative genitourinary   Musculoskeletal   Abdominal   Peds  Hematology negative hematology ROS (+)   Anesthesia Other Findings   Reproductive/Obstetrics negative OB ROS                             Anesthesia Physical Anesthesia Plan  ASA: 3  Anesthesia Plan: General   Post-op Pain Management:    Induction:   PONV Risk Score and Plan: Propofol infusion  Airway Management Planned:   Additional Equipment:   Intra-op Plan:   Post-operative Plan:   Informed Consent: I have reviewed the patients History and Physical, chart, labs and discussed the procedure including the risks, benefits and alternatives for the proposed anesthesia with the patient or authorized representative who has indicated his/her understanding and acceptance.     Dental Advisory Given  Plan Discussed with: CRNA  Anesthesia Plan Comments:        Anesthesia Quick Evaluation

## 2022-07-02 NOTE — Op Note (Signed)
[facility] Patient Name: Henry Webb Procedure Date: 07/02/2022 9:05 AM MRN: [id number] Date of Birth: 1970/07/31 Attending MD: Norvel Richards , MD, [id number] CSN: [id number] Age: 52 Admit Type: Outpatient Procedure:                Upper GI endoscopy Indications:              Dysphagia Providers:                Norvel Richards, MD, Caprice Kluver, Raphael Gibney, Technician Referring MD:              Medicines:                Propofol per Anesthesia Complications:            No immediate complications. Estimated Blood Loss:     Estimated blood loss: none. Procedure:                Pre-Anesthesia Assessment:                           - Prior to the procedure, a History and Physical                            was performed, and patient medications and                            allergies were reviewed. The patient's tolerance of                            previous anesthesia was also reviewed. The risks                            and benefits of the procedure and the sedation                            options and risks were discussed with the patient.                            All questions were answered, and informed consent                            was obtained. Prior Anticoagulants: The patient has                            taken no anticoagulant or antiplatelet agents. ASA                            Grade Assessment: III - A patient with severe                            systemic disease. After reviewing the risks and  benefits, the patient was deemed in satisfactory                            condition to undergo the procedure.                           After obtaining informed consent, the endoscope was                            passed under direct vision. Throughout the                            procedure, the patient's blood pressure, pulse, and                            oxygen saturations were  monitored continuously. The                            GIF-H190 ([id number]) scope was introduced through the                            mouth, and advanced to the second part of duodenum.                            The upper GI endoscopy was accomplished without                            difficulty. The patient tolerated the procedure                            well. Scope In: 9:23:12 AM Scope Out: 9:34:33 AM Total Procedure Duration: 0 hours 11 minutes 21 seconds  Findings:      The examined esophagus was normal. Gastric cavity empty.      A small hiatal hernia was present. Normal-appearing gastric mucosa.       Patent pylorus.      The duodenal bulb and second portion of the duodenum were normal. The       scope was withdrawn. Dilation was performed with a Maloney dilator with       mild resistance at 56 Fr. The dilation site was examined following       endoscope reinsertion and showed no change. Estimated blood loss: none. Impression:               - Normal esophagus. Dilated.                           - Small hiatal hernia.                           - Normal duodenal bulb and second portion of the                            duodenum.                           - No specimens  collected. Patient states chest pain                            has improved since he has been on Protonix 40 mg                            daily Moderate Sedation:      Moderate (conscious) sedation was personally administered by an       anesthesia professional. The following parameters were monitored: oxygen       saturation, heart rate, blood pressure, respiratory rate, EKG, adequacy       of pulmonary ventilation, and response to care. Recommendation:           - Patient has a contact number available for                            emergencies. The signs and symptoms of potential                            delayed complications were discussed with the                            patient. Return to normal  activities tomorrow.                            Written discharge instructions were provided to the                            patient.                           - Resume previous diet.                           - Continue present medications.                           - Return to my office in 3 months. See colonoscopy                            report. Procedure Code(s):        --- Professional ---                           [phone], Esophagogastroduodenoscopy, flexible,                            transoral; diagnostic, including collection of                            specimen(s) by brushing or washing, when performed                            (separate procedure)                           62836, Dilation of esophagus, by  unguided sound or                            bougie, single or multiple passes Diagnosis Code(s):        --- Professional ---                           K44.9, Diaphragmatic hernia without obstruction or                            gangrene                           R13.10, Dysphagia, unspecified CPT copyright 2022 American Medical Association. All rights reserved. The codes documented in this report are preliminary and upon coder review may  be revised to meet current compliance requirements. Cristopher Estimable. Sherlynn Tourville, MD Norvel Richards, MD 07/02/2022 9:39:26 AM This report has been signed electronically. Number of Addenda: 0

## 2022-07-03 LAB — SURGICAL PATHOLOGY

## 2022-07-03 NOTE — Anesthesia Postprocedure Evaluation (Signed)
Anesthesia Post Note  Patient: Henry Webb, Henry Webb  Procedure(s) Performed: COLONOSCOPY WITH PROPOFOL ESOPHAGOGASTRODUODENOSCOPY (EGD) WITH PROPOFOL Henry Webb POLYPECTOMY  Patient location during evaluation: Phase II Anesthesia Type: General Level of consciousness: awake Pain management: pain level controlled Vital Signs Assessment: post-procedure vital signs reviewed and stable Respiratory status: spontaneous breathing and respiratory function stable Cardiovascular status: blood pressure returned to baseline and stable Postop Assessment: no headache and no apparent nausea or vomiting Anesthetic complications: no Comments: Late entry   No notable events documented.   Last Vitals:  Vitals:   07/02/22 0803 07/02/22 1005  BP: 121/80 107/65  Pulse: 76 81  Resp: 18 18  Temp: 36.6 C 36.6 C  SpO2: 95% 95%    Last Pain:  Vitals:   07/02/22 1005  TempSrc: Oral  PainSc: 0-No pain                 Louann Sjogren

## 2022-07-05 ENCOUNTER — Encounter: Payer: Self-pay | Admitting: Internal Medicine

## 2022-07-08 ENCOUNTER — Encounter (HOSPITAL_COMMUNITY): Payer: Self-pay | Admitting: Internal Medicine

## 2022-08-05 ENCOUNTER — Other Ambulatory Visit: Payer: Self-pay | Admitting: *Deleted

## 2022-08-05 MED ORDER — AMLODIPINE BESYLATE 5 MG PO TABS: 5.0000 mg | ORAL_TABLET | Freq: Every day | ORAL | 6 refills | Status: DC

## 2022-08-29 ENCOUNTER — Encounter: Payer: Self-pay | Admitting: Internal Medicine

## 2022-11-03 ENCOUNTER — Encounter (HOSPITAL_COMMUNITY): Payer: Self-pay | Admitting: Psychiatry

## 2022-11-03 ENCOUNTER — Ambulatory Visit (INDEPENDENT_AMBULATORY_CARE_PROVIDER_SITE_OTHER): Admitting: Psychiatry

## 2022-11-03 DIAGNOSIS — F331 Major depressive disorder, recurrent, moderate: Secondary | ICD-10-CM | POA: Diagnosis not present
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
## 2022-11-06 ENCOUNTER — Ambulatory Visit: Admitting: Family Medicine

## 2022-11-06 ENCOUNTER — Encounter: Payer: Self-pay | Admitting: Family Medicine

## 2022-11-06 VITALS — BP 111/76 | HR 85 | Temp 98.0°F | Ht 72.0 in | Wt 228.4 lb

## 2022-11-06 DIAGNOSIS — F4310 Post-traumatic stress disorder, unspecified: Secondary | ICD-10-CM | POA: Diagnosis not present

## 2022-11-06 DIAGNOSIS — D869 Sarcoidosis, unspecified: Secondary | ICD-10-CM

## 2022-11-06 DIAGNOSIS — I10 Essential (primary) hypertension: Secondary | ICD-10-CM | POA: Diagnosis not present

## 2022-11-06 DIAGNOSIS — R7303 Prediabetes: Secondary | ICD-10-CM

## 2022-11-06 DIAGNOSIS — I2510 Atherosclerotic heart disease of native coronary artery without angina pectoris: Secondary | ICD-10-CM

## 2022-11-06 DIAGNOSIS — Z955 Presence of coronary angioplasty implant and graft: Secondary | ICD-10-CM

## 2022-11-06 DIAGNOSIS — F339 Major depressive disorder, recurrent, unspecified: Secondary | ICD-10-CM

## 2022-11-06 DIAGNOSIS — E782 Mixed hyperlipidemia: Secondary | ICD-10-CM | POA: Diagnosis not present

## 2022-11-06 DIAGNOSIS — F411 Generalized anxiety disorder: Secondary | ICD-10-CM

## 2022-11-06 DIAGNOSIS — R519 Headache, unspecified: Secondary | ICD-10-CM

## 2022-11-06 DIAGNOSIS — E559 Vitamin D deficiency, unspecified: Secondary | ICD-10-CM

## 2022-11-06 DIAGNOSIS — M25552 Pain in left hip: Secondary | ICD-10-CM

## 2022-11-06 LAB — BAYER DCA HB A1C WAIVED: HB A1C (BAYER DCA - WAIVED): 5.5 % (ref 4.8–5.6)

## 2022-11-06 MED ORDER — PREDNISONE 20 MG PO TABS
40.0000 mg | ORAL_TABLET | Freq: Every day | ORAL | 0 refills | Status: AC
Start: 2022-11-06 — End: 2022-11-11

## 2022-11-06 NOTE — Progress Notes (Signed)
Subjective:  Patient ID: Henry Webb, male    DOB: [birth year], 52 y.o.   MRN: [id number]  Patient Care Team: Sonny Masters, FNP as PCP - General (Family Medicine) Wyline Mood, Dorothe Pea, MD as PCP - Cardiology (Cardiology) Jena Gauss Gerrit Friends, MD as Consulting Physician (Gastroenterology)   Chief Complaint:  New Patient (Initial Visit) (Dr. Scharlene Gloss office ), Establish Care, Headache (On going for years but has gotten worse since last 2 weeks ), and Hip Pain (Left hip pain that has been ongoing since surgery )   HPI: Henry Webb is a 52 y.o. male presenting on 11/06/2022 for New Patient (Initial Visit) (Dr. Scharlene Gloss office ), Establish Care, Headache (On going for years but has gotten worse since last 2 weeks ), and Hip Pain (Left hip pain that has been ongoing since surgery )   Pt presents today to establish care with new PCP. He is retired from [employer] and recently separated from his wife. He states he lives alone in [place]. He was formerly followed by Dr. Scharlene Gloss office and was last seen 05/2022. He is also followed by cardiology, BH, urology, podiatry, GI, and pulmonology. He has a complex medical history. CAD: 07/2020 cath: LM patent, LAD distal 75%, D1 99%, D2 70%, ramus 99%, LCX mid 99%, OM1 70%, RCA 25%, RPDA 90%. Received DES to LCX and OM. Other disease small vessel or diffuse distal disease and medically managed. He is on ASA, Imdur, Propranolol, and Statin. He has hypertension and hyperlipidemia which is managed with CCB, BB, and statin therapy. GAD, PTSD, and Depression managed by Zoloft, Venlafaxine, and Restoril. GERD managed by PPI therpay. He has sarcoidosis involving eye, neuro, lung, liver, kidney, and lymph nodes. He was followed by neurology and rheumatology in the past but does not wish to see them at this time. His biggest concern today is his headache and left hip pain. States he has frequent headaches from his sarcoidosis but this headache has lasted for  about 2 weeks. He was on Diamox in the past for headache management but this caused extensive renal stones so it was discontinued. Reports prednisone typically helps when the headaches get bad. He also reports left hip pain this has been ongoing for several years, started after surgery and is worse at times. Denies recent injuries.   Available EHR records reviewed in detail.       Headache  This is a recurrent problem. The current episode started more than 1 year ago. The problem occurs intermittently. The problem has been waxing and waning. The pain is located in the Frontal region. The quality of the pain is described as pulsating, squeezing and band-like. The pain is moderate. Associated symptoms include insomnia and a visual change. Pertinent negatives include no abdominal pain, abnormal behavior, anorexia, back pain, blurred vision, coughing, dizziness, drainage, ear pain, eye pain, eye redness, eye watering, facial sweating, fever, hearing loss, loss of balance, muscle aches, nausea, neck pain, numbness, phonophobia, photophobia, rhinorrhea, scalp tenderness, seizures, sinus pressure, sore throat, swollen glands, tinnitus, vomiting, weakness or weight loss.      11/06/2022    2:10 PM  GAD 7 : Generalized Anxiety Score  Nervous, Anxious, on Edge 1  Control/stop worrying 0  Worry too much - different things 0  Trouble relaxing 2  Restless 1  Easily annoyed or irritable 0  Afraid - awful might happen 0  Total GAD 7 Score 4  Anxiety Difficulty Not difficult at all  11/06/2022    2:10 PM 11/03/2022   10:35 AM  Depression screen PHQ 2/9  Decreased Interest 3 3  Down, Depressed, Hopeless 3 3  PHQ - 2 Score 6 6  Altered sleeping 0 0  Tired, decreased energy 1 1  Change in appetite 0 0  Feeling bad or failure about yourself  1 3  Trouble concentrating 3 3  Moving slowly or fidgety/restless 2 3  Suicidal thoughts 0 0  PHQ-9 Score 13 16  Difficult doing work/chores Not difficult  at all Somewhat difficult     Relevant past medical, surgical, family, and social history reviewed and updated as indicated.  Allergies and medications reviewed and updated. Data reviewed: Chart in Epic.   Past Medical History:  Diagnosis Date   Anxiety    Arthritis    CAD (coronary artery disease)    Depression    Fibromyalgia    GERD (gastroesophageal reflux disease)    History of gout    Kidney stones    Migraine    Morbid obesity (HCC)    Palpitations    Sarcoidosis    Tachycardia    Testicular hypofunction     Past Surgical History:  Procedure Laterality Date   COLONOSCOPY WITH PROPOFOL N/A 07/02/2022   Procedure: COLONOSCOPY WITH PROPOFOL;  Surgeon: Corbin Ade, MD;  Location: [facility];  Service: Endoscopy;  Laterality: N/A;  9:15 am   CORONARY PRESSURE/FFR STUDY N/A 07/31/2020   Procedure: INTRAVASCULAR PRESSURE WIRE/FFR STUDY;  Surgeon: Swaziland, Peter M, MD;  Location: [facility] INVASIVE CV LAB;  Service: Cardiovascular;  Laterality: N/A;   CORONARY STENT INTERVENTION N/A 07/31/2020   Procedure: CORONARY STENT INTERVENTION;  Surgeon: Swaziland, Peter M, MD;  Location: [facility] INVASIVE CV LAB;  Service: Cardiovascular;  Laterality: N/A;   ESOPHAGOGASTRODUODENOSCOPY (EGD) WITH PROPOFOL N/A 07/02/2022   Procedure: ESOPHAGOGASTRODUODENOSCOPY (EGD) WITH PROPOFOL;  Surgeon: Corbin Ade, MD;  Location: [facility];  Service: Endoscopy;  Laterality: N/A;   Fusion     c 3-5- cevical fusion   LEFT HEART CATH AND CORONARY ANGIOGRAPHY N/A 07/31/2020   Procedure: LEFT HEART CATH AND CORONARY ANGIOGRAPHY;  Surgeon: Swaziland, Peter M, MD;  Location: [facility] INVASIVE CV LAB;  Service: Cardiovascular;  Laterality: N/A;   LITHOTRIPSY     MALONEY DILATION N/A 07/02/2022   Procedure: Elease Hashimoto DILATION;  Surgeon: Corbin Ade, MD;  Location: [facility];  Service: Endoscopy;  Laterality: N/A;   POLYPECTOMY  07/02/2022   Procedure: POLYPECTOMY;  Surgeon: Corbin Ade, MD;  Location: [facility];  Service: Endoscopy;;    Social History   Socioeconomic History   Marital status: Married    Spouse name: Not on file   Number of children: 3   Years of education: Not on file   Highest education level: Not on file  Occupational History   Occupation: Retired    Comment: Previously in [employer] x 22 years  Tobacco Use   Smoking status: Former    Types: Cigarettes    Quit date: 1996    Years since quitting: 28.4   Smokeless tobacco: Current    Types: Snuff  Vaping Use   Vaping Use: Never used  Substance and Sexual Activity   Alcohol use: Yes    Comment: rare - beer   Drug use: Yes    Types: Marijuana    Comment: use daily- smoke a bowl 5 x daily   Sexual activity: Yes  Other Topics Concern  Not on file  Social History Narrative   Not on file   Social Determinants of Health   Financial Resource Strain: Not on file  Food Insecurity: Not on file  Transportation Needs: Not on file  Physical Activity: Not on file  Stress: Not on file  Social Connections: Not on file  Intimate Partner Violence: Not on file    Outpatient Encounter Medications as of 11/06/2022  Medication Sig   amLODipine (NORVASC) 5 MG tablet Take 1 tablet (5 mg total) by mouth daily.   aspirin EC 81 MG tablet Take 1 tablet (81 mg total) by mouth daily. Swallow whole.   cyclobenzaprine (FLEXERIL) 10 MG tablet Take 10 mg by mouth 3 (three) times daily as needed for muscle spasms.   fluconazole (DIFLUCAN) 200 MG tablet Take 200 mg by mouth daily.   isosorbide mononitrate (IMDUR) 30 MG 24 hr tablet TAKE 1 TABLET(30 MG) BY MOUTH DAILY   nitroGLYCERIN (NITROSTAT) 0.4 MG SL tablet ONE TABLET UNDER TONGUE AS NEEDED FOR CHEST PAIN   nystatin (MYCOSTATIN) 100000 UNIT/ML suspension Take 5 mLs by mouth 4 (four) times daily as needed.   pantoprazole (PROTONIX) 40 MG tablet Take 40 mg by mouth daily.   predniSONE (DELTASONE) 20 MG tablet Take 2 tablets (40 mg total) by mouth daily with breakfast for 5  days. 2 po daily for 5 days   propranolol ER (INDERAL LA) 160 MG SR capsule TAKE 1 CAPSULE(160 MG) BY MOUTH DAILY   rosuvastatin (CRESTOR) 40 MG tablet TAKE 1 TABLET BY MOUTH EVERY DAY   sertraline (ZOLOFT) 100 MG tablet Take 100 mg by mouth daily.   temazepam (RESTORIL) 30 MG capsule Take 30 mg by mouth at bedtime.   Venlafaxine HCl 225 MG TB24 Take 225 mg by mouth daily.   No facility-administered encounter medications on file as of 11/06/2022.    Allergies  Allergen Reactions   Calcium-Containing Compounds     Flares sarcoidosis    Nsaids Other (See Comments)    High blood pressure    Review of Systems  Constitutional:  Positive for activity change, appetite change and fatigue. Negative for chills, diaphoresis, fever, unexpected weight change and weight loss.  HENT:  Negative for ear pain, hearing loss, rhinorrhea, sinus pressure, sore throat and tinnitus.   Eyes:  Negative for blurred vision, photophobia, pain, discharge, redness, itching and visual disturbance.  Respiratory:  Negative for cough and shortness of breath.   Cardiovascular:  Negative for chest pain, palpitations and leg swelling.  Gastrointestinal:  Negative for abdominal pain, anorexia, nausea and vomiting.  Genitourinary:  Negative for decreased urine volume and difficulty urinating.  Musculoskeletal:  Positive for arthralgias. Negative for back pain and neck pain.  Neurological:  Positive for headaches. Negative for dizziness, tremors, seizures, syncope, facial asymmetry, speech difficulty, weakness, light-headedness, numbness and loss of balance.  Psychiatric/Behavioral:  Positive for agitation, decreased concentration and sleep disturbance. Negative for behavioral problems, confusion, dysphoric mood, hallucinations, self-injury and suicidal ideas. The patient is nervous/anxious and has insomnia. The patient is not hyperactive.   All other systems reviewed and are negative.       Objective:  BP 111/76   Pulse  85   Temp 98 F (36.7 C) (Temporal)   Ht 6' (1.829 m)   Wt 228 lb 6.4 oz (103.6 kg)   SpO2 95%   BMI 30.98 kg/m    Wt Readings from Last 3 Encounters:  11/06/22 228 lb 6.4 oz (103.6 kg)  06/30/22 237 lb (107.5  kg)  06/23/22 237 lb (107.5 kg)    Physical Exam Vitals and nursing note reviewed.  Constitutional:      General: He is not in acute distress.    Appearance: Normal appearance. He is well-developed and well-groomed. He is obese. He is not ill-appearing, toxic-appearing or diaphoretic.  HENT:     Head: Normocephalic and atraumatic.     Jaw: There is normal jaw occlusion.     Right Ear: Hearing normal.     Left Ear: Hearing normal.     Nose: Nose normal.     Mouth/Throat:     Lips: Pink.     Mouth: Mucous membranes are moist.     Pharynx: Oropharynx is clear. Uvula midline.  Eyes:     General: Lids are normal.     Extraocular Movements: Extraocular movements intact.     Conjunctiva/sclera: Conjunctivae normal.     Pupils: Pupils are equal, round, and reactive to light.  Neck:     Thyroid: No thyroid mass, thyromegaly or thyroid tenderness.     Vascular: No carotid bruit or JVD.     Trachea: Trachea and phonation normal.  Cardiovascular:     Rate and Rhythm: Normal rate and regular rhythm.     Chest Wall: PMI is not displaced.     Pulses: Normal pulses.     Heart sounds: Normal heart sounds. No murmur heard.    No friction rub. No gallop.  Pulmonary:     Effort: Pulmonary effort is normal. No respiratory distress.     Breath sounds: Normal breath sounds. No wheezing.  Abdominal:     General: Bowel sounds are normal. There is no abdominal bruit.     Palpations: Abdomen is soft. There is no hepatomegaly or splenomegaly.  Musculoskeletal:        General: Normal range of motion.     Cervical back: Normal range of motion and neck supple.     Right hip: Normal.     Left hip: No deformity or lacerations. Normal range of motion. Normal strength.     Right lower leg:  No edema.     Left lower leg: No edema.  Lymphadenopathy:     Cervical: No cervical adenopathy.  Skin:    General: Skin is warm and dry.     Capillary Refill: Capillary refill takes less than 2 seconds.     Coloration: Skin is not cyanotic, jaundiced or pale.     Findings: No rash.  Neurological:     General: No focal deficit present.     Mental Status: He is alert and oriented to person, place, and time.     GCS: GCS eye subscore is 4. GCS verbal subscore is 5. GCS motor subscore is 6.     Cranial Nerves: Cranial nerves 2-12 are intact.     Sensory: Sensation is intact.     Motor: Motor function is intact.     Coordination: Coordination is intact.     Gait: Gait is intact.     Deep Tendon Reflexes: Reflexes are normal and symmetric.  Psychiatric:        Attention and Perception: Attention and perception normal.        Mood and Affect: Mood and affect normal.        Speech: Speech normal.        Behavior: Behavior normal. Behavior is cooperative.        Thought Content: Thought content normal.  Cognition and Memory: Cognition and memory normal.        Judgment: Judgment normal.     Results for orders placed or performed in visit on 11/06/22  Bayer DCA Hb A1c Waived  Result Value Ref Range   HB A1C (BAYER DCA - WAIVED) 5.5 4.8 - 5.6 %       Pertinent labs & imaging results that were available during my care of the patient were reviewed by me and considered in my medical decision making.  Assessment & Plan:  Paulette was seen today for new patient (initial visit), establish care, headache and hip pain.  Diagnoses and all orders for this visit:  Essential hypertension BP well controlled. Changes were not made in regimen today. Goal BP is 130/80. Pt aware to report any persistent high or low readings. DASH diet and exercise encouraged. Exercise at least 150 minutes per week and increase as tolerated. Goal BMI > 25. Stress management encouraged. Avoid nicotine and  tobacco product use. Avoid excessive alcohol and NSAID's. Avoid more than 2000 mg of sodium daily. Medications as prescribed. Follow up as scheduled.  -     CBC with Differential/Platelet -     CMP14+EGFR -     Thyroid Panel With TSH -     VITAMIN D 25 Hydroxy (Vit-D Deficiency, Fractures)  Sarcoidosis Was followed by neurology and rheumatology in the past. Does not wish to return at this time.   Mixed hyperlipidemia Diet encouraged - increase intake of fresh fruits and vegetables, increase intake of lean proteins. Bake, broil, or grill foods. Avoid fried, greasy, and fatty foods. Avoid fast foods. Increase intake of fiber-rich whole grains. Exercise encouraged - at least 150 minutes per week and advance as tolerated.  Goal BMI < 25. Continue medications as prescribed. Follow up in 3-6 months as discussed.  -     CMP14+EGFR  PTSD (post-traumatic stress disorder) Recurrent depression (HCC) Generalized anxiety disorder Encouraged to keep follow up with [facility].  No SI or HI reported.  -     Thyroid Panel With TSH  Prediabetes Will check labs today. Diet and exercise encouraged.  -     CMP14+EGFR -     Bayer DCA Hb A1c Waived  Vitamin D deficiency Labs pending. Further treatment pending results.  -     VITAMIN D 25 Hydroxy (Vit-D Deficiency, Fractures)  Headache disorder Declines referral to neurology. No red flags concerning for acute cerebrovascular event. Will burst with steroids as this has been beneficial in the past.  -     predniSONE (DELTASONE) 20 MG tablet; Take 2 tablets (40 mg total) by mouth daily with breakfast for 5 days. 2 po daily for 5 days  Pain of left hip No new injuries. Will burst with steroids as this has been beneficial in the past.  -     predniSONE (DELTASONE) 20 MG tablet; Take 2 tablets (40 mg total) by mouth daily with breakfast for 5 days. 2 po daily for 5 days     Continue all other maintenance medications.  Follow up plan: Return if symptoms worsen or  fail to improve, for 3-4 months for CPE.   Continue healthy lifestyle choices, including diet (rich in fruits, vegetables, and lean proteins, and low in salt and simple carbohydrates) and exercise (at least 30 minutes of moderate physical activity daily).  Educational handout given for health maintenance  The above assessment and management plan was discussed with the patient. The patient verbalized understanding of and has  agreed to the management plan. Patient is aware to call the clinic if they develop any new symptoms or if symptoms persist or worsen. Patient is aware when to return to the clinic for a follow-up visit. Patient educated on when it is appropriate to go to the emergency department.   Monia Pouch, FNP-C [facility] [phone]

## 2022-11-07 LAB — CBC WITH DIFFERENTIAL/PLATELET
Basophils Absolute: 0.1 10*3/uL (ref 0.0–0.2)
Basos: 1 %
EOS (ABSOLUTE): 0.4 10*3/uL (ref 0.0–0.4)
Eos: 4 %
Hematocrit: 42.1 % (ref 37.5–51.0)
Hemoglobin: 14.2 g/dL (ref 13.0–17.7)
Immature Grans (Abs): 0.1 10*3/uL (ref 0.0–0.1)
Immature Granulocytes: 1 %
Lymphocytes Absolute: 2.7 10*3/uL (ref 0.7–3.1)
Lymphs: 27 %
MCH: 29.3 pg (ref 26.6–33.0)
MCHC: 33.7 g/dL (ref 31.5–35.7)
MCV: 87 fL (ref 79–97)
Monocytes Absolute: 0.8 10*3/uL (ref 0.1–0.9)
Monocytes: 8 %
Neutrophils Absolute: 5.9 10*3/uL (ref 1.4–7.0)
Neutrophils: 59 %
Platelets: 367 10*3/uL (ref 150–450)
RBC: 4.84 x10E6/uL (ref 4.14–5.80)
RDW: 13 % (ref 11.6–15.4)
WBC: 10 10*3/uL (ref 3.4–10.8)

## 2022-11-07 LAB — THYROID PANEL WITH TSH
Free Thyroxine Index: 1.4 (ref 1.2–4.9)
T3 Uptake Ratio: 22 % — ABNORMAL LOW (ref 24–39)
T4, Total: 6.3 ug/dL (ref 4.5–12.0)
TSH: 1.75 u[IU]/mL (ref 0.450–4.500)

## 2022-11-07 LAB — CMP14+EGFR
ALT: 18 IU/L (ref 0–44)
AST: 18 IU/L (ref 0–40)
Albumin/Globulin Ratio: 2
Albumin: 4.5 g/dL (ref 3.8–4.9)
Alkaline Phosphatase: 83 IU/L (ref 44–121)
BUN/Creatinine Ratio: 19 (ref 9–20)
BUN: 16 mg/dL (ref 6–24)
Bilirubin Total: 0.5 mg/dL (ref 0.0–1.2)
CO2: 23 mmol/L (ref 20–29)
Calcium: 9.3 mg/dL (ref 8.7–10.2)
Chloride: 104 mmol/L (ref 96–106)
Creatinine, Ser: 0.86 mg/dL (ref 0.76–1.27)
Globulin, Total: 2.2 g/dL (ref 1.5–4.5)
Glucose: 77 mg/dL (ref 70–99)
Potassium: 4.8 mmol/L (ref 3.5–5.2)
Sodium: 139 mmol/L (ref 134–144)
Total Protein: 6.7 g/dL (ref 6.0–8.5)
eGFR: 105 mL/min/{1.73_m2} (ref >59)

## 2022-11-07 LAB — VITAMIN D 25 HYDROXY (VIT D DEFICIENCY, FRACTURES): Vit D, 25-Hydroxy: 17.6 ng/mL — ABNORMAL LOW (ref 30.0–100.0)

## 2022-11-07 MED ORDER — VITAMIN D (ERGOCALCIFEROL) 1.25 MG (50000 UNIT) PO CAPS
50000.0000 [IU] | ORAL_CAPSULE | ORAL | 3 refills | Status: DC
Start: 2022-11-07 — End: 2024-02-23

## 2022-11-07 NOTE — Addendum Note (Signed)
Addended by: Sonny Masters on: 11/07/2022 08:01 AM   Modules accepted: Orders

## 2022-11-17 ENCOUNTER — Other Ambulatory Visit: Payer: Self-pay | Admitting: Family Medicine

## 2022-12-17 ENCOUNTER — Emergency Department (HOSPITAL_COMMUNITY)
Admission: EM | Admit: 2022-12-17 | Discharge: 2022-12-17 | Disposition: A | Discharge status: Home / Self Care | Attending: Emergency Medicine | Admitting: Emergency Medicine

## 2022-12-17 ENCOUNTER — Encounter (HOSPITAL_COMMUNITY): Payer: Self-pay | Admitting: Emergency Medicine

## 2022-12-17 ENCOUNTER — Other Ambulatory Visit: Payer: Self-pay

## 2022-12-17 ENCOUNTER — Emergency Department (HOSPITAL_COMMUNITY)

## 2022-12-17 DIAGNOSIS — Z7982 Long term (current) use of aspirin: Secondary | ICD-10-CM | POA: Insufficient documentation

## 2022-12-17 DIAGNOSIS — S62651A Nondisplaced fracture of medial phalanx of left index finger, initial encounter for closed fracture: Secondary | ICD-10-CM | POA: Insufficient documentation

## 2022-12-17 DIAGNOSIS — R200 Anesthesia of skin: Secondary | ICD-10-CM | POA: Diagnosis not present

## 2022-12-17 DIAGNOSIS — Z79899 Other long term (current) drug therapy: Secondary | ICD-10-CM | POA: Diagnosis not present

## 2022-12-17 DIAGNOSIS — S62651B Nondisplaced fracture of medial phalanx of left index finger, initial encounter for open fracture: Secondary | ICD-10-CM

## 2022-12-17 DIAGNOSIS — W260XXA Contact with knife, initial encounter: Secondary | ICD-10-CM | POA: Diagnosis not present

## 2022-12-17 DIAGNOSIS — I2510 Atherosclerotic heart disease of native coronary artery without angina pectoris: Secondary | ICD-10-CM | POA: Diagnosis not present

## 2022-12-17 DIAGNOSIS — S6992XA Unspecified injury of left wrist, hand and finger(s), initial encounter: Secondary | ICD-10-CM | POA: Diagnosis present

## 2022-12-17 DIAGNOSIS — I10 Essential (primary) hypertension: Secondary | ICD-10-CM | POA: Insufficient documentation

## 2022-12-17 MED ORDER — LIDOCAINE HCL (PF) 2 % IJ SOLN: 10.0000 mL | Freq: Once | INTRAMUSCULAR | Status: AC

## 2022-12-17 MED ORDER — CEFAZOLIN SODIUM-DEXTROSE 2-4 GM/100ML-% IV SOLN
2.0000 g | INTRAVENOUS | Status: AC
  Administered 2022-12-17: 2 g via INTRAVENOUS
  Filled 2022-12-17: qty 100

## 2022-12-17 MED ORDER — HYDROCODONE-ACETAMINOPHEN 5-325 MG PO TABS: ORAL_TABLET | ORAL | 0 refills | Status: DC

## 2022-12-17 MED ORDER — POVIDONE-IODINE 10 % EX SOLN
CUTANEOUS | Status: DC | PRN
  Filled 2022-12-17: qty 29.6

## 2022-12-17 MED ORDER — ONDANSETRON 4 MG PO TBDP
4.0000 mg | ORAL_TABLET | Freq: Once | ORAL | Status: AC
  Administered 2022-12-17: 4 mg via ORAL

## 2022-12-17 MED ORDER — CEPHALEXIN 500 MG PO CAPS: 500.0000 mg | ORAL_CAPSULE | Freq: Four times a day (QID) | ORAL | 0 refills | Status: DC

## 2022-12-17 MED ORDER — LIDOCAINE HCL (PF) 2 % IJ SOLN
INTRAMUSCULAR | Status: AC
  Administered 2022-12-17: 10 mL via INTRADERMAL
  Filled 2022-12-17: qty 10

## 2022-12-17 MED ORDER — ONDANSETRON 4 MG PO TBDP
ORAL_TABLET | ORAL | Status: AC
  Filled 2022-12-17: qty 1

## 2022-12-17 NOTE — ED Triage Notes (Signed)
Pt reports cutting left index finger on knife. Pt reports being up to date on tetanus.

## 2022-12-17 NOTE — Discharge Instructions (Signed)
Keep the wound clean and bandaged.  You may clean with mild soap and water.  Do not apply alcohol or peroxide.  Keep the finger splinted and elevate your hand when possible to help with swelling.  Call the orthopedic provider listed to arrange follow-up appointment.  Return to the emergency department for any new or worsening symptoms.

## 2022-12-17 NOTE — ED Provider Notes (Signed)
EMERGENCY DEPARTMENT AT [facility] Provider Note   CSN: [id number] Arrival date & time: 12/17/22  4098     History  No chief complaint on file.   Henry Webb is a 52 y.o. male.  HPI     Henry Webb is a 52 y.o. male with past medical history of hypertension, sarcoidosis, angina, generalized anxiety disorder, and coronary artery disease who presents to the Emergency Department complaining of laceration of his left index finger that occurred earlier this morning.  He was using a curved corn knife to saw a small limb of a tree when he accidentally cut the side of his left index finger.  He cleaned the wound with tap water.  He complains of numbness to the distal end of the index finger.  Some pain with movement.  He takes aspirin daily but denies blood thinners. States his hands were dirty from handling animal food and debris prior to laceration.  Td up to date per patient.   Home Medications Prior to Admission medications   Medication Sig Start Date End Date Taking? Authorizing Provider  amLODipine (NORVASC) 5 MG tablet Take 1 tablet (5 mg total) by mouth daily. 08/05/22   Antoine Poche, MD  aspirin EC 81 MG tablet Take 1 tablet (81 mg total) by mouth daily. Swallow whole. 06/27/20   Tereso Newcomer T, PA-C  cyclobenzaprine (FLEXERIL) 10 MG tablet Take 10 mg by mouth 3 (three) times daily as needed for muscle spasms.    [provider]  fluconazole (DIFLUCAN) 200 MG tablet Take 200 mg by mouth daily.    [provider]  isosorbide mononitrate (IMDUR) 30 MG 24 hr tablet TAKE 1 TABLET(30 MG) BY MOUTH DAILY 03/20/22   Antoine Poche, MD  nitroGLYCERIN (NITROSTAT) 0.4 MG SL tablet ONE TABLET UNDER TONGUE AS NEEDED FOR CHEST PAIN 05/23/21   Antoine Poche, MD  nystatin (MYCOSTATIN) 100000 UNIT/ML suspension Take 5 mLs by mouth 4 (four) times daily as needed. 09/09/22   [provider]  pantoprazole (PROTONIX) 40 MG  tablet Take 40 mg by mouth daily. 04/08/22   [provider]  propranolol ER (INDERAL LA) 160 MG SR capsule TAKE 1 CAPSULE(160 MG) BY MOUTH DAILY 06/23/22   Antoine Poche, MD  rosuvastatin (CRESTOR) 40 MG tablet TAKE 1 TABLET BY MOUTH EVERY DAY 06/09/22   Antoine Poche, MD  sertraline (ZOLOFT) 100 MG tablet TAKE 1 TABLET BY MOUTH EVERY DAY 11/17/22   Hawks, Neysa Bonito A, FNP  temazepam (RESTORIL) 30 MG capsule Take 30 mg by mouth at bedtime.    [provider]  Venlafaxine HCl 225 MG TB24 Take 225 mg by mouth daily.    [provider]  Vitamin D, Ergocalciferol, (DRISDOL) 1.25 MG (50000 UNIT) CAPS capsule Take 1 capsule (50,000 Units total) by mouth every 7 (seven) days. 11/07/22   Sonny Masters, FNP      Allergies    Calcium-containing compounds and Nsaids    Review of Systems   Review of Systems  Constitutional:  Negative for chills and fever.  Respiratory:  Negative for shortness of breath.   Cardiovascular:  Negative for chest pain.  Gastrointestinal:  Negative for nausea and vomiting.  Musculoskeletal:  Negative for arthralgias, joint swelling and neck pain.       Numbness distal left index finger  Skin:  Positive for wound.  Neurological:  Negative for dizziness and weakness.    Physical Exam Updated Vital Signs  BP 120/80 (BP Location: Right Arm)   Pulse 80   Temp 98.2 F (36.8 C) (Oral)   Resp 18   SpO2 99%  Physical Exam Vitals and nursing note reviewed.  Constitutional:      Appearance: Normal appearance.  HENT:     Head: Atraumatic.  Cardiovascular:     Rate and Rhythm: Normal rate and regular rhythm.     Pulses: Normal pulses.  Pulmonary:     Effort: Pulmonary effort is normal.  Musculoskeletal:        General: Tenderness and signs of injury present. No swelling.     Left hand: Laceration and tenderness present. No swelling, deformity or bony tenderness. Normal range of motion. Normal strength. There is disruption of two-point  discrimination. Normal capillary refill. Normal pulse.     Comments: 7 cm laceration to the radial aspect of the left index finger.  Bleeding controlled.  Small puncture wound to the palmar aspect of the finger.  Few small pieces of debris noted, no edema.  Nail intact. Pt has FROM of the finger  See attached photos  Skin:    General: Skin is warm.     Capillary Refill: Capillary refill takes less than 2 seconds.     Coloration: Skin is not pale.     Findings: No erythema.  Neurological:     General: No focal deficit present.     Mental Status: He is alert.     Sensory: No sensory deficit.     Motor: No weakness.           ED Results / Procedures / Treatments   Labs (all labs ordered are listed, but only abnormal results are displayed) Labs Reviewed - No data to display  EKG None  Radiology DG Finger Index Left  Result Date: 12/17/2022 CLINICAL DATA:  CT left index finger on knife. EXAM: LEFT INDEX FINGER 2+V COMPARISON:  None Available. FINDINGS: There is longitudinal linear lucency within the lateral/radial soft tissues of the index finger from the proximal diaphysis of the proximal phalanx through the distal aspect of the middle phalanx. Along the same trajectory, there is thin linear longitudinal lucency within the far lateral aspect of the middle phalanx of the index finger indicating a posttraumatic fissure of the bone from the knife injury. No displacement is seen. This nondisplaced fracture from the knife injury extends from the junction of the far proximal lateral cortex of the middle phalanx and the proximal articular surface of the middle phalanx distally to the distal lateral articular surface of the middle phalanx. Moderate second and third DIP and mild-to-moderate second and third PIP, thumb metacarpophalangeal, and thumb carpometacarpal joint space narrowing and peripheral osteophytosis. No radiopaque foreign body is seen. IMPRESSION: 1. Index finger lateral soft  tissue laceration/injury and nondisplaced longitudinal fissure/fracture of the lateral aspect of the middle phalanx of the index finger from knife injury. 2. Osteoarthritis as above. Electronically Signed   By: Neita Garnet M.D.   On: 12/17/2022 09:46    Procedures Procedures     LACERATION REPAIR Performed by: Tashayla Therien Authorized by: Honore Wipperfurth Consent: Verbal consent obtained. Risks and benefits: risks, benefits and alternatives were discussed Consent given by: patient Patient identity confirmed: provided demographic data Prepped and Draped in normal sterile fashion Wound explored  Laceration Location: Left index finger  Laceration Length: 7 cm  Few foreign Bodies seen, removed through irrigation  Anesthesia: Digital block, local anesthesia  Local anesthetic: lidocaine 2% without epinephrine  Anesthetic total:  5 ml  Irrigation method: syringe  Amount of cleaning: Irrigated thoroughly   Skin closure: 4-0 Ethilon  Number of sutures: 4  Technique: Simple interrupted, loosely approximated  Patient tolerance: Patient tolerated the procedure well with no immediate complications.  Medications Ordered in ED Medications - No data to display  ED Course/ Medical Decision Making/ A&P                             Medical Decision Making Patient here for evaluation of laceration to the left index finger while using a knife to saw a tree limb.  Has a large laceration to the radial aspect of the finger, decreased sensation distally, good cap refill.  Patient is able to perform range of motion of the finger.  Bleeding controlled.  Takes aspirin daily but no blood thinners.    Amount and/or Complexity of Data Reviewed Radiology: ordered.    Details: X-ray shows soft tissue injury, longitudinal fissure/fracture of the middle phalanx Discussion of management or test interpretation with external provider(s):  Open fx of the finger, Wound irrigated, few foreign bodies  removed.  Some numbness of the distal finger, Wound was loosely approximated, Td up-to-date per patient  Wound was then bandaged and finger splinted.  IV Ancef given here.  Consulted with orthopedics, Dr. Romeo Apple regarding findings and he will see patient in clinic for follow-up.  Wound care instructions provided, rx for keflex, sutures out in 8 to 10 days.  Risk Prescription drug management.            Final Clinical Impression(s) / ED Diagnoses Final diagnoses:  Open nondisplaced fracture of middle phalanx of left index finger, initial encounter    Rx / DC Orders ED Discharge Orders     None         Rosey Bath 12/17/22 Ruel Favors, MD 12/18/22 [id number]

## 2022-12-18 ENCOUNTER — Telehealth: Payer: Self-pay

## 2022-12-18 NOTE — Transitions of Care (Post Inpatient/ED Visit) (Signed)
12/18/2022  Name: Henry Webb MRN: [id number] DOB: 15-Jun-1970  Today's TOC FU Call Status: Today's TOC FU Call Status:: Successful TOC FU Call Competed TOC FU Call Complete Date: 12/18/22  Transition Care Management Follow-up Telephone Call Date of Discharge: 12/17/22 [facility] Type of Discharge: Emergency Department Reason for ED Visit: Other: (fracture of phalanx) How have you been since you were released from the hospital?: Same Any questions or concerns?: No  Items Reviewed: Did you receive and understand the discharge instructions provided?: Yes Medications obtained,verified, and reconciled?: Yes (Medications Reviewed) Any new allergies since your discharge?: No Dietary orders reviewed?: NA Do you have support at home?: Yes People in Home: spouse  Medications Reviewed Today: Medications Reviewed Today     Reviewed by Karena Addison, LPN (Licensed Practical Nurse) on 12/18/22 at [phone]  Med List Status: <None>   Medication Order Taking? Sig Documenting Provider Last Dose Status Informant  amLODipine (NORVASC) 5 MG tablet [id number] No Take 1 tablet (5 mg total) by mouth daily. Antoine Poche, MD Taking Active   aspirin EC 81 MG tablet [id number] No Take 1 tablet (81 mg total) by mouth daily. Swallow whole. Tereso Newcomer T, PA-C Taking Active Self, Pharmacy Records  cephALEXin (KEFLEX) 500 MG capsule [id number]  Take 1 capsule (500 mg total) by mouth 4 (four) times daily. Triplett, Tammy, PA-C  Active   cyclobenzaprine (FLEXERIL) 10 MG tablet [id number] No Take 10 mg by mouth 3 (three) times daily as needed for muscle spasms. [provider] Taking Active Self, Pharmacy Records  fluconazole (DIFLUCAN) 200 MG tablet [id number] No Take 200 mg by mouth daily. [provider] Taking Active   HYDROcodone-acetaminophen (NORCO/VICODIN) 5-325 MG tablet [id number]  Take one tab po q 4 hrs prn pain Triplett, Tammy, PA-C  Active    isosorbide mononitrate (IMDUR) 30 MG 24 hr tablet [id number] No TAKE 1 TABLET(30 MG) BY MOUTH DAILY Branch, Dorothe Pea, MD Taking Active Self, Pharmacy Records  nitroGLYCERIN (NITROSTAT) 0.4 MG SL tablet [id number] No ONE TABLET UNDER TONGUE AS NEEDED FOR CHEST PAIN Branch, Dorothe Pea, MD Taking Active Self, Pharmacy Records  nystatin (MYCOSTATIN) 100000 UNIT/ML suspension [id number] No Take 5 mLs by mouth 4 (four) times daily as needed. [provider] Taking Active   pantoprazole (PROTONIX) 40 MG tablet [id number] No Take 40 mg by mouth daily. [provider] Taking Active Self, Pharmacy Records  propranolol ER (INDERAL LA) 160 MG SR capsule [id number] No TAKE 1 CAPSULE(160 MG) BY MOUTH DAILY Branch, Dorothe Pea, MD Taking Active Self, Pharmacy Records  rosuvastatin (CRESTOR) 40 MG tablet [id number] No TAKE 1 TABLET BY MOUTH EVERY DAY Branch, Dorothe Pea, MD Taking Active Self, Pharmacy Records  sertraline (ZOLOFT) 100 MG tablet [id number]  TAKE 1 TABLET BY MOUTH EVERY DAY Hawks, Christy A, FNP  Active   temazepam (RESTORIL) 30 MG capsule [id number] No Take 30 mg by mouth at bedtime. [provider] Taking Active Self, Pharmacy Records  Venlafaxine HCl 225 MG TB24 [id number] No Take 225 mg by mouth daily. [provider] Taking Active Self, Pharmacy Records  Vitamin D, Ergocalciferol, (DRISDOL) 1.25 MG (50000 UNIT) CAPS capsule [id number]  Take 1 capsule (50,000 Units total) by mouth every 7 (seven) days. Sonny Masters, FNP  Active             Home Care and Equipment/Supplies: Were Home Health Services Ordered?: NA Any new equipment or medical supplies ordered?: NA  Functional Questionnaire: Do you need assistance  with bathing/showering or dressing?: No Do you need assistance with meal preparation?: No Do you need assistance with eating?: No Do you have difficulty maintaining continence: No Do you need assistance with getting out of bed/getting out of a  chair/moving?: No Do you have difficulty managing or taking your medications?: No  Follow up appointments reviewed: PCP Follow-up appointment confirmed?: NA Specialist Hospital Follow-up appointment confirmed?: Yes Date of Specialist follow-up appointment?: 12/18/22 Follow-Up Specialty Provider:: ortho hand specialist Do you need transportation to your follow-up appointment?: No Do you understand care options if your condition(s) worsen?: Yes-patient verbalized understanding    SIGNATURE Karena Addison, LPN [facility] Nurse Health Advisor Direct Dial [phone]

## 2022-12-19 ENCOUNTER — Ambulatory Visit: Admitting: Orthopedic Surgery

## 2022-12-19 ENCOUNTER — Encounter: Payer: Self-pay | Admitting: Orthopedic Surgery

## 2022-12-19 VITALS — BP 120/80 | Ht 72.0 in | Wt 228.0 lb

## 2022-12-19 DIAGNOSIS — S62651B Nondisplaced fracture of medial phalanx of left index finger, initial encounter for open fracture: Secondary | ICD-10-CM

## 2022-12-19 MED ORDER — CEPHALEXIN 500 MG PO CAPS: 500.0000 mg | ORAL_CAPSULE | Freq: Four times a day (QID) | ORAL | 0 refills | Status: DC

## 2022-12-19 NOTE — Progress Notes (Signed)
Office Visit Note   Patient: Henry Webb           Date of Birth: 1970/12/30           MRN: [id number] Visit Date: 12/19/2022 Requested by: Sonny Masters, FNP [address] [place],  [place] [address] PCP: Sonny Masters, FNP   Assessment & Plan: Laceration of the left index finger with fracture of the middle phalanx.  Dressing changed wound looks good continue Keflex sutures out on the fifth.  Nurse will take the sutures out the doctor does not need to be present  Meds ordered this encounter  Medications   cephALEXin (KEFLEX) 500 MG capsule    Sig: Take 1 capsule (500 mg total) by mouth 4 (four) times daily.    Dispense:  28 capsule    Refill:  0     Subjective: Chief Complaint  Patient presents with   Finger Injury    12/17/22 finger injury  fracture left index     HPI: July 24 the patient was working in the yard or field had a sickle type cutting instrument it was caught on some debris he pulled through it and it cut his index finger.  He was seen in the emergency room where he was found to have a laceration of bone fracture and this was treated with irrigation and loose closure  He was put on Keflex and is here for follow-up              ROS: No fever or drainage   Images personally read and my interpretation : Longitudinal fracture middle phalanx nondisplaced  Visit Diagnoses:  1. Nondisplaced fracture of middle phalanx of left index finger, initial encounter for open fracture      Follow-Up Instructions: Return in about 10 days (around 12/29/2022) for nurse remove sutures .    Objective: Vital Signs: BP 120/80 Comment: 12/17/22  Ht 6' (1.829 m)   Wt 228 lb (103.4 kg)   BMI 30.92 kg/m   Physical Exam Vitals and nursing note reviewed.  Constitutional:      Appearance: Normal appearance.  HENT:     Head: Normocephalic and atraumatic.  Eyes:     General: No scleral icterus.       Right eye: No discharge.        Left eye: No discharge.      Extraocular Movements: Extraocular movements intact.     Conjunctiva/sclera: Conjunctivae normal.     Pupils: Pupils are equal, round, and reactive to light.  Cardiovascular:     Rate and Rhythm: Normal rate.     Pulses: Normal pulses.  Skin:    General: Skin is warm and dry.     Capillary Refill: Capillary refill takes less than 2 seconds.  Neurological:     General: No focal deficit present.     Mental Status: He is alert and oriented to person, place, and time.  Psychiatric:        Mood and Affect: Mood normal.        Behavior: Behavior normal.        Thought Content: Thought content normal.        Judgment: Judgment normal.      Ortho Exam  The laceration looks good there is 4 sutures and there is no signs of infection he does have some stiffness mild tenderness  No evidence of tendon injury he does have a digital nerve laceration which she does not want to be  repaired and he has some numbness on that side of the finger   Specialty Comments:  No specialty comments available.  Imaging: No results found.   PMFS History: Patient Active Problem List   Diagnosis Date Noted   PTSD (post-traumatic stress disorder) 11/06/2022   Recurrent depression (HCC) 11/06/2022   Vitamin D deficiency 11/06/2022   Headache disorder 11/06/2022   History of coronary artery stent placement 11/06/2022   Prediabetes 06/04/2022   Nuclear sclerosis 06/04/2022   Cannabis dependence (HCC) 06/04/2022   Insomnia 12/01/2021   Generalized anxiety disorder 12/01/2021   Atherosclerosis of coronary artery without angina pectoris 11/28/2021   GERD (gastroesophageal reflux disease) 08/13/2020   Hemorrhoids 08/13/2020   Angina pectoris (HCC) 07/31/2020   Sarcoidosis 07/31/2020   Essential hypertension 07/31/2020   Mixed hyperlipidemia 07/31/2020   Past Medical History:  Diagnosis Date   Anxiety    Arthritis    CAD (coronary artery disease)    Depression    Fibromyalgia    GERD  (gastroesophageal reflux disease)    History of gout    Kidney stones    Migraine    Morbid obesity (HCC)    Palpitations    Sarcoidosis    Tachycardia    Testicular hypofunction     Family History  Problem Relation Age of Onset   Depression Mother    Ovarian cancer Mother    Rheum arthritis Mother    Depression Father    Heart attack Father    Hypertension Father    Ehlers-Danlos syndrome Sister    Depression Daughter    Headache Daughter    Colon cancer Cousin 22    Past Surgical History:  Procedure Laterality Date   COLONOSCOPY WITH PROPOFOL N/A 07/02/2022   Procedure: COLONOSCOPY WITH PROPOFOL;  Surgeon: Corbin Ade, MD;  Location: [facility]O [facility];  Service: Endoscopy;  Laterality: N/A;  9:15 am   CORONARY PRESSURE/FFR STUDY N/A 07/31/2020   Procedure: INTRAVASCULAR PRESSURE WIRE/FFR STUDY;  Surgeon: Swaziland, Peter M, MD;  Location: [facility] INVASIVE CV LAB;  Service: Cardiovascular;  Laterality: N/A;   CORONARY STENT INTERVENTION N/A 07/31/2020   Procedure: CORONARY STENT INTERVENTION;  Surgeon: Swaziland, Peter M, MD;  Location: [facility] INVASIVE CV LAB;  Service: Cardiovascular;  Laterality: N/A;   ESOPHAGOGASTRODUODENOSCOPY (EGD) WITH PROPOFOL N/A 07/02/2022   Procedure: ESOPHAGOGASTRODUODENOSCOPY (EGD) WITH PROPOFOL;  Surgeon: Corbin Ade, MD;  Location: [facility];  Service: Endoscopy;  Laterality: N/A;   Fusion     c 3-5- cevical fusion   LEFT HEART CATH AND CORONARY ANGIOGRAPHY N/A 07/31/2020   Procedure: LEFT HEART CATH AND CORONARY ANGIOGRAPHY;  Surgeon: Swaziland, Peter M, MD;  Location: [facility] INVASIVE CV LAB;  Service: Cardiovascular;  Laterality: N/A;   LITHOTRIPSY     MALONEY DILATION N/A 07/02/2022   Procedure: Elease Hashimoto DILATION;  Surgeon: Corbin Ade, MD;  Location: [facility];  Service: Endoscopy;  Laterality: N/A;   POLYPECTOMY  07/02/2022   Procedure: POLYPECTOMY;  Surgeon: Corbin Ade, MD;  Location: [facility];  Service: Endoscopy;;   Social History    Occupational History   Occupation: Retired    Comment: Previously in [employer] x 22 years  Tobacco Use   Smoking status: Former    Current packs/day: 0.00    Types: Cigarettes    Quit date: 1996    Years since quitting: 28.5   Smokeless tobacco: Current    Types: Snuff  Vaping Use   Vaping status: Never Used  Substance and Sexual Activity   Alcohol use: Yes    Comment: rare - beer   Drug use: Yes    Types: Marijuana    Comment: use daily- smoke a bowl 5 x daily   Sexual activity: Yes

## 2022-12-29 ENCOUNTER — Ambulatory Visit: Admitting: Orthopedic Surgery

## 2022-12-29 DIAGNOSIS — S62651B Nondisplaced fracture of medial phalanx of left index finger, initial encounter for open fracture: Secondary | ICD-10-CM

## 2022-12-29 NOTE — Progress Notes (Signed)
Patient came in for suture removal.  Sutures removed without issue.  Incision clean and dry.  Instructed him to keep covered while working, for protection for a few more weeks.  He will finish all antibiotics.  No future appointment on schedule.

## 2022-12-31 ENCOUNTER — Ambulatory Visit: Admitting: Cardiology

## 2023-01-28 ENCOUNTER — Other Ambulatory Visit: Payer: Self-pay | Admitting: Family Medicine

## 2023-01-28 DIAGNOSIS — E559 Vitamin D deficiency, unspecified: Secondary | ICD-10-CM

## 2023-01-28 NOTE — Telephone Encounter (Signed)
Once the rx is complete. He will switch to OTC 400IU daily.

## 2023-02-13 ENCOUNTER — Encounter: Payer: Self-pay | Admitting: Nurse Practitioner

## 2023-02-13 ENCOUNTER — Ambulatory Visit: Attending: Cardiology | Admitting: Nurse Practitioner

## 2023-02-13 VITALS — BP 100/64 | HR 83 | Ht 72.0 in | Wt 227.8 lb

## 2023-02-13 DIAGNOSIS — R002 Palpitations: Secondary | ICD-10-CM

## 2023-02-13 DIAGNOSIS — I2510 Atherosclerotic heart disease of native coronary artery without angina pectoris: Secondary | ICD-10-CM | POA: Diagnosis not present

## 2023-02-13 DIAGNOSIS — I493 Ventricular premature depolarization: Secondary | ICD-10-CM | POA: Diagnosis not present

## 2023-02-13 DIAGNOSIS — E785 Hyperlipidemia, unspecified: Secondary | ICD-10-CM | POA: Diagnosis not present

## 2023-02-13 MED ORDER — NITROGLYCERIN 0.4 MG SL SUBL: SUBLINGUAL_TABLET | SUBLINGUAL | 3 refills | Status: DC

## 2023-02-13 NOTE — Progress Notes (Unsigned)
Cardiology Office Note:  .   Date:  02/13/2023 ID:  Henry Webb, DOB 1970-11-22, MRN [id number] PCP: Sonny Masters, FNP  [facility] Providers Cardiologist:  Dina Rich, MD    History of Present Illness: Henry Webb is a 52 y.o. male with a PMH of chest pain, CAD, palpitations/PVCs, whitecoat hypertension, hyperlipidemia, sarcoid, history of syncope, history of recurrent kidney stones/hematuria, who presents today for 6 month follow-up.    History of sarcoid involvement of eye, liver, lungs, lymph nodes, kidney, and neuro-followed by rheumatology.  Cardiac PET in 2020 at [facility] showed coronary calcifications with no evidence of ischemia, test ordered to evaluate for cardiac sarcoid was negative.  GXT in 2021 was negative for ST/T wave changes, Duke treadmill score of 2.5.  In 2022, underwent cardiac catheterization-see full report below.  Received drug-eluting stent to left circumflex, OM.  Last seen by Dr. Dina Rich on June 23, 2022.  LDL was above goal.  It was discussed about adding Zetia to his statin regimen.  Patient was in favor of dietary changes at the time.  Today he presents for 11-month follow-up appointment.  He states he has been doing well from a cardiac perspective. Did have an injury to his left index finger 11/2022 while working with a corn knife, healed well. Denies any chest pain, shortness of breath, palpitations, syncope, presyncope, dizziness, orthopnea, PND, swelling or significant weight changes, acute bleeding, or claudication.  ROS: Negative. See HPI.  Studies Reviewed: Marland Kitchen    EKG: EKG Interpretation Date/Time:  Friday February 13 2023 13:10:59 EDT Ventricular Rate:  86 PR Interval:  154 QRS Duration:  84 QT Interval:  382 QTC Calculation: 457 R Axis:   76  Text Interpretation: Normal sinus rhythm Normal ECG When compared with ECG of 31-Jul-2020 19:29, PREVIOUS ECG IS PRESENT Confirmed by Sharlene Dory [phone]) on  02/13/2023 1:17:40 PM    Cardiac monitor 01/2022:    3 day monitor   Rare supraventricular ectopy in the form of isolated PACs.   Rare ventricular ectopy in the form of isolated PVCs   Episodes of second degree mobitz I [place] Mask) block   No symptoms reported     Patch Wear Time:  2 days and 18 hours (2023-08-09T11:15:06-398 to 2023-08-12T05:38:26-0400)   Patient had a min HR of 36 bpm, max HR of 153 bpm, and avg HR of 95 bpm. Predominant underlying rhythm was Sinus Rhythm. Second Degree AV Block-Mobitz I (Wenckebach) was present. Isolated SVEs were rare (<1.0%), and no SVE Couplets or SVE Triplets were  present. Isolated VEs were rare (<1.0%), and no VE Couplets or VE Triplets were present. Ventricular Bigeminy was present.   LHC 07/2020:  Prox RCA lesion is 25% stenosed. RPDA lesion is 90% stenosed. 1st RPL lesion is 60% stenosed. 2nd RPL lesion is 60% stenosed. Prox LAD to Mid LAD lesion is 40% stenosed. Dist LAD-1 lesion is 70% stenosed. Dist LAD-2 lesion is 75% stenosed. RPAV lesion is 50% stenosed. 1st Diag lesion is 99% stenosed. 2nd Diag lesion is 70% stenosed. Ramus lesion is 99% stenosed. 1st Mrg lesion is 70% stenosed. A drug-eluting stent was successfully placed using a STENT RESOLUTE ONYX [id number]. Post intervention, there is a 0% residual stenosis. Mid Cx to Dist Cx lesion is 99% stenosed. A drug-eluting stent was successfully placed using a STENT RESOLUTE [id number]. Post intervention, there is a 0% residual stenosis. The left ventricular systolic function is normal. LV end diastolic pressure is normal. The left  ventricular ejection fraction is 55-65% by visual estimate.   1. 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 There was subtotal occlusion of the mid LCx which was a large vessel. There was also flow limiting stenosis in the first OM by RFR. 2. Normal LV function 3. Normal LVEDP 4. Successful  PCI of the mid LCx with DES 5. Successful PCI of the first OM with DES   Plan: DAPT for one year. Will need to treat residual small vessel disease medically. Anticipate same day DC.   Lexiscan 05/2020:  There was no ST segment deviation noted during stress. 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 This is an intermediate risk study. The left ventricular ejection fraction is normal (55-65%).  ETT 04/2020:  No diagnostic ST segment changes to indicate ischemia. Patient achieved 85% MPHR. Hypertensive response to exercise. Intermittent chest pain reported. No significant arrhythmias. Technically the Duke treadmill score is 2.5 and intermediate risk due to presence of non-limiting chest pain, although there were no diagnostic ischemic ST changes. Blood pressure demonstrated a hypertensive response to exercise.  Cardiac monitor 05/2019: 72 hour monitor Min HR 76, Max HR 147, Avg HR 105 Rare supraventricular ectopy in the form of isolated PACs Occasional ventricular ectopy. 1% PVC burden. In the form of isolated PVCs, couplets, bigeminy, trigeminy. No NSVT or VT. INCOMPLETE   Physical Exam:   VS:  BP 100/64   Pulse 83   Ht 6' (1.829 m)   Wt 227 lb 12.8 oz (103.3 kg)   SpO2 95%   BMI 30.90 kg/m    Wt Readings from Last 3 Encounters:  02/13/23 227 lb 12.8 oz (103.3 kg)  12/19/22 228 lb (103.4 kg)  11/06/22 228 lb 6.4 oz (103.6 kg)    GEN: Well nourished, well developed in no acute distress NECK: No JVD; No carotid bruits CARDIAC: S1/S2, RRR, no murmurs, rubs, gallops RESPIRATORY:  Clear to auscultation without rales, wheezing or rhonchi  ABDOMEN: Soft, non-tender, non-distended EXTREMITIES:  No edema; No deformity   ASSESSMENT AND PLAN: .    CAD Stable with no anginal symptoms. No indication for ischemic evaluation.  Continue aspirin, Imdur, propranolol, Crestor, and nitroglycerin as needed. Heart healthy diet and  regular cardiovascular exercise encouraged. Will provide refill of NTG today.  Palpitations, PVC's Denies any recent palpitations.  Continue propranolol. Heart healthy diet and regular cardiovascular exercise encouraged.   HLD LDL 77 05/2022. LDL above goal. Discussed adding Zetia to medication regimen. Pt declines. Pt states politely requests to work on lifestyle modifications. Continue Crestor. Heart healthy diet and regular cardiovascular exercise encouraged.      Dispo: Follow-up with Dr. Dina Rich or APP in 6 months or sooner if anything changes.   Signed, Sharlene Dory, NP

## 2023-02-13 NOTE — Patient Instructions (Signed)
Medication Instructions:   Nitroglycerin refilled today  Continue all other medications.     Labwork:  none  Testing/Procedures:  none  Follow-Up:  6 months   Any Other Special Instructions Will Be Listed Below (If Applicable).  Mediterranean diet info sheet given.  If you need a refill on your cardiac medications before your next appointment, please call your pharmacy.

## 2023-02-20 ENCOUNTER — Other Ambulatory Visit: Payer: Self-pay | Admitting: Cardiology

## 2023-03-03 ENCOUNTER — Encounter: Admitting: Family Medicine

## 2023-03-09 ENCOUNTER — Other Ambulatory Visit: Payer: Self-pay | Admitting: Cardiology

## 2023-03-14 ENCOUNTER — Other Ambulatory Visit: Payer: Self-pay | Admitting: Cardiology

## 2023-03-17 ENCOUNTER — Encounter: Payer: Self-pay | Admitting: Family Medicine

## 2023-03-17 ENCOUNTER — Ambulatory Visit (INDEPENDENT_AMBULATORY_CARE_PROVIDER_SITE_OTHER): Admitting: Family Medicine

## 2023-03-17 VITALS — BP 116/75 | HR 99 | Temp 98.2°F | Ht 73.0 in | Wt 226.8 lb

## 2023-03-17 DIAGNOSIS — G4701 Insomnia due to medical condition: Secondary | ICD-10-CM

## 2023-03-17 DIAGNOSIS — Z0000 Encounter for general adult medical examination without abnormal findings: Secondary | ICD-10-CM

## 2023-03-17 DIAGNOSIS — Z6829 Body mass index (BMI) 29.0-29.9, adult: Secondary | ICD-10-CM | POA: Diagnosis not present

## 2023-03-17 DIAGNOSIS — Z0001 Encounter for general adult medical examination with abnormal findings: Secondary | ICD-10-CM | POA: Diagnosis not present

## 2023-03-17 DIAGNOSIS — E559 Vitamin D deficiency, unspecified: Secondary | ICD-10-CM

## 2023-03-17 DIAGNOSIS — I10 Essential (primary) hypertension: Secondary | ICD-10-CM

## 2023-03-17 DIAGNOSIS — E782 Mixed hyperlipidemia: Secondary | ICD-10-CM | POA: Diagnosis not present

## 2023-03-17 DIAGNOSIS — I209 Angina pectoris, unspecified: Secondary | ICD-10-CM

## 2023-03-17 DIAGNOSIS — F339 Major depressive disorder, recurrent, unspecified: Secondary | ICD-10-CM

## 2023-03-17 DIAGNOSIS — K219 Gastro-esophageal reflux disease without esophagitis: Secondary | ICD-10-CM

## 2023-03-17 DIAGNOSIS — F1220 Cannabis dependence, uncomplicated: Secondary | ICD-10-CM

## 2023-03-17 DIAGNOSIS — R7303 Prediabetes: Secondary | ICD-10-CM

## 2023-03-17 DIAGNOSIS — F411 Generalized anxiety disorder: Secondary | ICD-10-CM

## 2023-03-17 LAB — BAYER DCA HB A1C WAIVED: HB A1C (BAYER DCA - WAIVED): 5.6 % (ref 4.8–5.6)

## 2023-03-17 MED ORDER — TRAZODONE HCL 50 MG PO TABS
25.0000 mg | ORAL_TABLET | Freq: Every evening | ORAL | 6 refills | Status: DC | PRN
Start: 2023-03-17 — End: 2024-01-16

## 2023-03-17 NOTE — Progress Notes (Signed)
Complete physical exam  Patient: Henry Webb   DOB: 1970-12-25   52 y.o. Male  MRN: [id number]  Subjective:    Chief Complaint  Patient presents with   Annual Exam    Henry Webb is a 52 y.o. male who presents today for a complete physical exam. He reports consuming a general diet. Home exercise routine includes lifting and cardio. He generally feels fairly well. He reports sleeping poorly. He does have additional problems to discuss today.  Discussed the use of AI scribe software for clinical note transcription with the patient, who gave verbal consent to proceed.  History of Present Illness   The patient, with a history of depression, presents with a worsening depressive state over the past few months. He attributes this to feelings of loneliness and a cycle of negative self-talk that he struggles to interrupt. Despite adherence to venlafaxine, he reports persistent sadness.   The patient has a history of counseling but has become disillusioned with the process due to negative experiences, including a recent incident where he felt dismissed and misdiagnosed with a substance abuse problem. He also expresses frustration with a previous counselor who he felt exacerbated issues within his marriage. He is currently not seeing a counselor and is hesitant to do so.  He reports chest pain associated with eating, which he attributes to a known hiatal hernia. He denies any new cardiac symptoms and reports a recent cardiology appointment where everything was deemed fine.  The patient also reports sleep issues, which have been a long-standing problem. He has previously been on temazepam, which he found effective but is reluctant to restart due to its addictive nature. He has tried melatonin with no significant improvement. He also mentions experiencing restless leg syndrome for the past two months.  The patient has a history of kidney stones and currently has one, but he is managing  it without seeing a urologist. He has a plan to go to the emergency room if it does not pass.  He also mentions a recent weight loss, which has moved him from the obese to overweight category. He expresses a desire to continue this progress. He also mentions a recent increase in physical activity, specifically weight lifting.        03/17/2023    2:55 PM 11/06/2022    2:10 PM 11/03/2022   10:35 AM  Depression screen PHQ 2/9  Decreased Interest 2 3   Down, Depressed, Hopeless 2 3   PHQ - 2 Score 4 6   Altered sleeping 3 0   Tired, decreased energy 0 1   Change in appetite 0 0   Feeling bad or failure about yourself  1 1   Trouble concentrating 2 3   Moving slowly or fidgety/restless 0 2   Suicidal thoughts 0 0   PHQ-9 Score 10 13   Difficult doing work/chores Somewhat difficult Not difficult at all      Information is confidential and restricted. Go to Review Flowsheets to unlock data.      03/17/2023    2:56 PM 11/06/2022    2:10 PM  GAD 7 : Generalized Anxiety Score  Nervous, Anxious, on Edge 1 1  Control/stop worrying 0 0  Worry too much - different things 0 0  Trouble relaxing 3 2  Restless 2 1  Easily annoyed or irritable 0 0  Afraid - awful might happen 0 0  Total GAD 7 Score 6 4  Anxiety Difficulty Somewhat difficult Not difficult  at all      Most recent fall risk assessment:    03/17/2023    2:55 PM  Fall Risk   Falls in the past year? 1  Number falls in past yr: 0  Injury with Fall? 0  Follow up Falls prevention discussed     Most recent depression screenings:    03/17/2023    2:55 PM 11/06/2022    2:10 PM  PHQ 2/9 Scores  PHQ - 2 Score 4 6  PHQ- 9 Score 10 13    Vision:Within last year and Dental: No current dental problems  Patient Active Problem List   Diagnosis Date Noted   BMI 29.0-29.9,adult 03/17/2023   PTSD (post-traumatic stress disorder) 11/06/2022   Recurrent depression (HCC) 11/06/2022   Vitamin D deficiency 11/06/2022   Headache  disorder 11/06/2022   History of coronary artery stent placement 11/06/2022   Prediabetes 06/04/2022   Nuclear sclerosis 06/04/2022   Cannabis dependence (HCC) 06/04/2022   Insomnia 12/01/2021   Generalized anxiety disorder 12/01/2021   Atherosclerosis of coronary artery without angina pectoris 11/28/2021   GERD (gastroesophageal reflux disease) 08/13/2020   Hemorrhoids 08/13/2020   Angina pectoris (HCC) 07/31/2020   Sarcoidosis 07/31/2020   Essential hypertension 07/31/2020   Mixed hyperlipidemia 07/31/2020   Past Medical History:  Diagnosis Date   Anxiety    Arthritis    CAD (coronary artery disease)    Depression    Fibromyalgia    GERD (gastroesophageal reflux disease)    History of gout    Kidney stones    Migraine    Morbid obesity (HCC)    Palpitations    Sarcoidosis    Tachycardia    Testicular hypofunction    Past Surgical History:  Procedure Laterality Date   COLONOSCOPY WITH PROPOFOL N/A 07/02/2022   Procedure: COLONOSCOPY WITH PROPOFOL;  Surgeon: Corbin Ade, MD;  Location: [facility];  Service: Endoscopy;  Laterality: N/A;  9:15 am   CORONARY PRESSURE/FFR STUDY N/A 07/31/2020   Procedure: INTRAVASCULAR PRESSURE WIRE/FFR STUDY;  Surgeon: Swaziland, Peter M, MD;  Location: [facility] INVASIVE CV LAB;  Service: Cardiovascular;  Laterality: N/A;   CORONARY STENT INTERVENTION N/A 07/31/2020   Procedure: CORONARY STENT INTERVENTION;  Surgeon: Swaziland, Peter M, MD;  Location: [facility] INVASIVE CV LAB;  Service: Cardiovascular;  Laterality: N/A;   ESOPHAGOGASTRODUODENOSCOPY (EGD) WITH PROPOFOL N/A 07/02/2022   Procedure: ESOPHAGOGASTRODUODENOSCOPY (EGD) WITH PROPOFOL;  Surgeon: Corbin Ade, MD;  Location: [facility];  Service: Endoscopy;  Laterality: N/A;   Fusion     c 3-5- cevical fusion   LEFT HEART CATH AND CORONARY ANGIOGRAPHY N/A 07/31/2020   Procedure: LEFT HEART CATH AND CORONARY ANGIOGRAPHY;  Surgeon: Swaziland, Peter M, MD;  Location: [facility] INVASIVE CV LAB;  Service:  Cardiovascular;  Laterality: N/A;   LITHOTRIPSY     MALONEY DILATION N/A 07/02/2022   Procedure: Elease Hashimoto DILATION;  Surgeon: Corbin Ade, MD;  Location: [facility];  Service: Endoscopy;  Laterality: N/A;   POLYPECTOMY  07/02/2022   Procedure: POLYPECTOMY;  Surgeon: Corbin Ade, MD;  Location: [facility];  Service: Endoscopy;;   Social History   Tobacco Use   Smoking status: Former    Current packs/day: 0.00    Types: Cigarettes    Quit date: 1996    Years since quitting: 28.8   Smokeless tobacco: Current    Types: Snuff  Vaping Use   Vaping status: Never Used  Substance Use Topics   Alcohol use: Yes  Comment: rare - beer   Drug use: Yes    Types: Marijuana    Comment: use daily- smoke a bowl 5 x daily   Social History   Socioeconomic History   Marital status: Married    Spouse name: Not on file   Number of children: 3   Years of education: Not on file   Highest education level: Not on file  Occupational History   Occupation: Retired    Comment: Previously in [employer] x 22 years  Tobacco Use   Smoking status: Former    Current packs/day: 0.00    Types: Cigarettes    Quit date: 1996    Years since quitting: 28.8   Smokeless tobacco: Current    Types: Snuff  Vaping Use   Vaping status: Never Used  Substance and Sexual Activity   Alcohol use: Yes    Comment: rare - beer   Drug use: Yes    Types: Marijuana    Comment: use daily- smoke a bowl 5 x daily   Sexual activity: Yes  Other Topics Concern   Not on file  Social History Narrative   Not on file   Social Determinants of Health   Financial Resource Strain: Not on file  Food Insecurity: Not on file  Transportation Needs: Not on file  Physical Activity: Not on file  Stress: Not on file  Social Connections: Not on file  Intimate Partner Violence: Not on file   Family Status  Relation Name Status   Mother  Alive   Father  Deceased   Sister  Alive   Daughter 2 Alive   Son 1 Alive    MGM  Deceased   MGF  Deceased   PGM  Deceased   PGF  Deceased   Cousin  Deceased  No partnership data on file   Family History  Problem Relation Age of Onset   Depression Mother    Ovarian cancer Mother    Rheum arthritis Mother    Depression Father    Heart attack Father    Hypertension Father    Ehlers-Danlos syndrome Sister    Depression Daughter    Headache Daughter    Colon cancer Cousin 40   Allergies  Allergen Reactions   Calcium-Containing Compounds     Flares sarcoidosis    Nsaids Other (See Comments)    High blood pressure      Patient Care Team: Aura Bibby, Doralee Albino, FNP as PCP - General (Family Medicine) Wyline Mood, Dorothe Pea, MD as PCP - Cardiology (Cardiology) Corbin Ade, MD as Consulting Physician (Gastroenterology)   Outpatient Medications Prior to Visit  Medication Sig   amLODipine (NORVASC) 5 MG tablet TAKE 1 TABLET(5 MG) BY MOUTH DAILY   aspirin EC 81 MG tablet Take 1 tablet (81 mg total) by mouth daily. Swallow whole.   cyclobenzaprine (FLEXERIL) 10 MG tablet Take 10 mg by mouth 3 (three) times daily as needed for muscle spasms.   isosorbide mononitrate (IMDUR) 30 MG 24 hr tablet TAKE 1 TABLET(30 MG) BY MOUTH DAILY   nitroGLYCERIN (NITROSTAT) 0.4 MG SL tablet ONE TABLET UNDER TONGUE AS NEEDED FOR CHEST PAIN   nystatin (MYCOSTATIN) 100000 UNIT/ML suspension Take 5 mLs by mouth 4 (four) times daily as needed.   pantoprazole (PROTONIX) 40 MG tablet Take 40 mg by mouth daily.   propranolol ER (INDERAL LA) 160 MG SR capsule TAKE 1 CAPSULE(160 MG) BY MOUTH DAILY   rosuvastatin (CRESTOR) 40 MG tablet TAKE 1 TABLET  BY MOUTH EVERY DAY   sertraline (ZOLOFT) 100 MG tablet TAKE 1 TABLET BY MOUTH EVERY DAY   Venlafaxine HCl 225 MG TB24 Take 225 mg by mouth daily.   Vitamin D, Ergocalciferol, (DRISDOL) 1.25 MG (50000 UNIT) CAPS capsule Take 1 capsule (50,000 Units total) by mouth every 7 (seven) days.   [DISCONTINUED] fluconazole (DIFLUCAN) 200 MG tablet Take 200 mg  by mouth daily.   [DISCONTINUED] temazepam (RESTORIL) 30 MG capsule Take 30 mg by mouth at bedtime. (Patient not taking: Reported on 03/17/2023)   No facility-administered medications prior to visit.    ROS per HPI      Objective:     BP 116/75   Pulse 99   Temp 98.2 F (36.8 C) (Temporal)   Ht 6\' 1"  (1.854 m)   Wt 226 lb 12.8 oz (102.9 kg)   SpO2 94%   BMI 29.92 kg/m  BP Readings from Last 3 Encounters:  03/17/23 116/75  02/13/23 100/64  12/19/22 120/80   Wt Readings from Last 3 Encounters:  03/17/23 226 lb 12.8 oz (102.9 kg)  02/13/23 227 lb 12.8 oz (103.3 kg)  12/19/22 228 lb (103.4 kg)   SpO2 Readings from Last 3 Encounters:  03/17/23 94%  02/13/23 95%  12/17/22 99%      Physical Exam Vitals and nursing note reviewed.  Constitutional:      General: He is not in acute distress.    Appearance: Normal appearance. He is well-developed, well-groomed and overweight. He is not ill-appearing, toxic-appearing or diaphoretic.  HENT:     Head: Normocephalic and atraumatic.     Jaw: There is normal jaw occlusion.     Right Ear: Hearing normal.     Left Ear: Hearing normal.     Nose: Nose normal.     Mouth/Throat:     Lips: Pink.     Mouth: Mucous membranes are moist.     Pharynx: Oropharynx is clear. Uvula midline.  Eyes:     General: Lids are normal.     Conjunctiva/sclera: Conjunctivae normal.     Pupils: Pupils are equal, round, and reactive to light.  Neck:     Trachea: Trachea and phonation normal.  Cardiovascular:     Rate and Rhythm: Normal rate and regular rhythm.     Chest Wall: PMI is not displaced.     Pulses: Normal pulses.     Heart sounds: Normal heart sounds. No murmur heard.    No friction rub. No gallop.  Pulmonary:     Effort: Pulmonary effort is normal. No respiratory distress.     Breath sounds: Normal breath sounds. No wheezing.  Abdominal:     General: Bowel sounds are normal. There is no abdominal bruit.     Palpations: Abdomen is  soft. There is no hepatomegaly or splenomegaly.  Musculoskeletal:        General: Normal range of motion.     Cervical back: Normal range of motion and neck supple.     Right lower leg: No edema.     Left lower leg: No edema.  Skin:    General: Skin is warm and dry.     Capillary Refill: Capillary refill takes less than 2 seconds.     Coloration: Skin is not cyanotic, jaundiced or pale.     Findings: No rash.  Neurological:     General: No focal deficit present.     Mental Status: He is alert and oriented to person, place, and time.  Sensory: Sensation is intact.     Motor: Motor function is intact.     Coordination: Coordination is intact.     Gait: Gait is intact.     Deep Tendon Reflexes: Reflexes are normal and symmetric.  Psychiatric:        Attention and Perception: Attention and perception normal.        Mood and Affect: Mood and affect normal.        Speech: Speech normal.        Behavior: Behavior normal. Behavior is cooperative.        Thought Content: Thought content normal.        Cognition and Memory: Cognition and memory normal.        Judgment: Judgment normal.      Results for orders placed or performed in visit on 03/17/23  Bayer DCA Hb A1c Waived  Result Value Ref Range   HB A1C (BAYER DCA - WAIVED) 5.6 4.8 - 5.6 %   Last CBC Lab Results  Component Value Date   WBC 10.0 11/06/2022   HGB 14.2 11/06/2022   HCT 42.1 11/06/2022   MCV 87 11/06/2022   MCH 29.3 11/06/2022   RDW 13.0 11/06/2022   PLT 367 11/06/2022   Last metabolic panel Lab Results  Component Value Date   GLUCOSE 77 11/06/2022   NA 139 11/06/2022   K 4.8 11/06/2022   CL 104 11/06/2022   CO2 23 11/06/2022   BUN 16 11/06/2022   CREATININE 0.86 11/06/2022   EGFR 105 11/06/2022   CALCIUM 9.3 11/06/2022   PROT 6.7 11/06/2022   ALBUMIN 4.5 11/06/2022   LABGLOB 2.2 11/06/2022   AGRATIO 2.0 11/06/2022   BILITOT 0.5 11/06/2022   ALKPHOS 83 11/06/2022   AST 18 11/06/2022   ALT 18  11/06/2022   ANIONGAP 11 08/01/2020   Last hemoglobin A1c Lab Results  Component Value Date   HGBA1C 5.6 03/17/2023   Last thyroid functions Lab Results  Component Value Date   TSH 1.750 11/06/2022   T4TOTAL 6.3 11/06/2022   Last vitamin D Lab Results  Component Value Date   VD25OH 17.6 (L) 11/06/2022       Assessment & Plan:    Routine Health Maintenance and Physical Exam  Immunization History  Administered Date(s) Administered   Tdap 03/16/2021    Health Maintenance  Topic Date Due   COVID-19 Vaccine (1) 04/02/2023 (Originally 05/06/1976)   Zoster Vaccines- Shingrix (1 of 2) 06/17/2023 (Originally 05/06/1990)   INFLUENZA VACCINE  08/24/2023 (Originally 12/25/2022)   Hepatitis C Screening  03/16/2024 (Originally 05/06/1989)   HIV Screening  03/16/2024 (Originally 05/06/1986)   DTaP/Tdap/Td (2 - Td or Tdap) 03/17/2031   Colonoscopy  07/02/2032   HPV VACCINES  Aged Out    Discussed health benefits of physical activity, and encouraged him to engage in regular exercise appropriate for his age and condition.  Problem List Items Addressed This Visit       Cardiovascular and Mediastinum   Angina pectoris (HCC)   Relevant Orders   CMP14+EGFR   CBC with Differential/Platelet   Lipid panel   Thyroid Panel With TSH   Essential hypertension   Relevant Orders   CMP14+EGFR   CBC with Differential/Platelet   Lipid panel   Thyroid Panel With TSH     Digestive   GERD (gastroesophageal reflux disease)   Relevant Orders   CBC with Differential/Platelet     Other   Mixed hyperlipidemia   Relevant Orders  CMP14+EGFR   Lipid panel   Prediabetes   Relevant Orders   CMP14+EGFR   Bayer DCA Hb A1c Waived (Completed)   Insomnia   Relevant Medications   traZODone (DESYREL) 50 MG tablet   Other Relevant Orders   Thyroid Panel With TSH   Generalized anxiety disorder   Relevant Medications   traZODone (DESYREL) 50 MG tablet   Cannabis dependence (HCC)   Recurrent  depression (HCC)   Relevant Medications   traZODone (DESYREL) 50 MG tablet   Other Relevant Orders   Thyroid Panel With TSH   Vitamin D deficiency   Relevant Orders   CMP14+EGFR   VITAMIN D 25 Hydroxy (Vit-D Deficiency, Fractures)   BMI 29.0-29.9,adult   Relevant Orders   CMP14+EGFR   CBC with Differential/Platelet   Lipid panel   Thyroid Panel With TSH   VITAMIN D 25 Hydroxy (Vit-D Deficiency, Fractures)   Bayer DCA Hb A1c Waived (Completed)   Other Visit Diagnoses     Annual physical exam    -  Primary   Relevant Orders   CMP14+EGFR   CBC with Differential/Platelet   Lipid panel   Thyroid Panel With TSH   VITAMIN D 25 Hydroxy (Vit-D Deficiency, Fractures)   Bayer DCA Hb A1c Waived (Completed)     Assessment and Plan    Depression Persistent depressive symptoms despite adherence to venlafaxine. Patient has been experiencing increased loneliness and negative self-talk. Previous negative experiences with counseling, but potential benefit from re-engagement in therapy. -Continue venlafaxine. -Consider re-engagement in counseling, offered in-office counselor as an option.  Insomnia Patient has been out of temazepam, a medication previously prescribed for sleep. Patient has a history of sleep apnea and has tried CPAP in the past but was unable to tolerate it. Patient has tried melatonin with no significant improvement. -Prescribe Desyrel (trazodone) starting at 25-50mg  at bedtime, with the option to increase dose if needed.  Cardiovascular Health Patient has a history of cardiac issues, but recent cardiology appointment was unremarkable. Patient reports occasional chest pain associated with eating, likely related to hiatal hernia. -Continue current cardiac management.  Kidney Stones Patient currently experiencing a kidney stone, has a history of multiple stones. Patient has been managing with fluids and Flomax from an ER visit. -Advise patient to seek medical attention if  stone does not pass or pain worsens.  Weight Management Patient has been actively working on weight loss and has successfully reduced BMI from obese to overweight range. -Encourage continued efforts in weight management.  General Health Maintenance -Order labs for routine monitoring. -Declined vaccines.       Return if symptoms worsen or fail to improve.     Kari Baars, FNP

## 2023-03-17 NOTE — Patient Instructions (Signed)
If your symptoms worsen or you have thoughts of suicide/homicide, PLEASE SEEK IMMEDIATE MEDICAL ATTENTION.  You may always call the National Suicide Hotline.  This is available 24 hours a day, 7 days a week.  Their number is: [phone] ? ?Taking the medicine as directed and not missing any doses is one of the best things you can do to treat your depression.  Here are some things to keep in mind: ? ?Side effects (stomach upset, some increased anxiety) may happen before you notice a benefit.  These side effects typically go away over time. ?Changes to your dose of medicine or a change in medication all together is sometimes necessary ?Most people need to be on medication at least 12 months ?Many people will notice an improvement within two weeks but the full effect of the medication can take up to 4-6 weeks ?Stopping the medication when you start feeling better often results in a return of symptoms ?Never discontinue your medication without contacting a health care professional first.  Some medications require gradual discontinuation/ taper and can make you sick if you stop them abruptly. ? ?If your symptoms worsen or you have thoughts of suicide/homicide, PLEASE SEEK IMMEDIATE MEDICAL ATTENTION.  You may always call: ? ?Cell Phone 988 ?National Suicide Hotline: (937)534-4901 ?[place]: (604)025-5989 ?[facility] in [place]: (416)[phone] ? ?These are available 24 hours a day, 7 days a week. ? ?

## 2023-03-18 LAB — CBC WITH DIFFERENTIAL/PLATELET
Basophils Absolute: 0.1 10*3/uL (ref 0.0–0.2)
Basos: 1 %
EOS (ABSOLUTE): 0.5 10*3/uL — ABNORMAL HIGH (ref 0.0–0.4)
Eos: 4 %
Hematocrit: 42.8 % (ref 37.5–51.0)
Hemoglobin: 14.3 g/dL (ref 13.0–17.7)
Immature Grans (Abs): 0.1 10*3/uL (ref 0.0–0.1)
Immature Granulocytes: 1 %
Lymphocytes Absolute: 2.6 10*3/uL (ref 0.7–3.1)
Lymphs: 25 %
MCH: 29.9 pg (ref 26.6–33.0)
MCHC: 33.4 g/dL (ref 31.5–35.7)
MCV: 89 fL (ref 79–97)
Monocytes Absolute: 1 10*3/uL — ABNORMAL HIGH (ref 0.1–0.9)
Monocytes: 10 %
Neutrophils Absolute: 6.1 10*3/uL (ref 1.4–7.0)
Neutrophils: 59 %
Platelets: 389 10*3/uL (ref 150–450)
RBC: 4.79 x10E6/uL (ref 4.14–5.80)
RDW: 12.8 % (ref 11.6–15.4)
WBC: 10.2 10*3/uL (ref 3.4–10.8)

## 2023-03-18 LAB — CMP14+EGFR
ALT: 18 [IU]/L (ref 0–44)
AST: 15 [IU]/L (ref 0–40)
Albumin: 4.6 g/dL (ref 3.8–4.9)
Alkaline Phosphatase: 95 [IU]/L (ref 44–121)
BUN/Creatinine Ratio: 13 (ref 9–20)
BUN: 16 mg/dL (ref 6–24)
Bilirubin Total: 0.3 mg/dL (ref 0.0–1.2)
CO2: 26 mmol/L (ref 20–29)
Calcium: 9.8 mg/dL (ref 8.7–10.2)
Chloride: 102 mmol/L (ref 96–106)
Creatinine, Ser: 1.22 mg/dL (ref 0.76–1.27)
Globulin, Total: 2.4 g/dL (ref 1.5–4.5)
Glucose: 75 mg/dL (ref 70–99)
Potassium: 4.6 mmol/L (ref 3.5–5.2)
Sodium: 141 mmol/L (ref 134–144)
Total Protein: 7 g/dL (ref 6.0–8.5)
eGFR: 72 mL/min/{1.73_m2} (ref >59)

## 2023-03-18 LAB — THYROID PANEL WITH TSH
Free Thyroxine Index: 1.4 (ref 1.2–4.9)
T3 Uptake Ratio: 21 % — ABNORMAL LOW (ref 24–39)
T4, Total: 6.9 ug/dL (ref 4.5–12.0)
TSH: 1.62 u[IU]/mL (ref 0.450–4.500)

## 2023-03-18 LAB — LIPID PANEL
Chol/HDL Ratio: 4.2 ratio (ref 0.0–5.0)
Cholesterol, Total: 156 mg/dL (ref 100–199)
HDL: 37 mg/dL — ABNORMAL LOW (ref >39)
LDL Chol Calc (NIH): 85 mg/dL (ref 0–99)
Triglycerides: 197 mg/dL — ABNORMAL HIGH (ref 0–149)
VLDL Cholesterol Cal: 34 mg/dL (ref 5–40)

## 2023-03-18 LAB — VITAMIN D 25 HYDROXY (VIT D DEFICIENCY, FRACTURES): Vit D, 25-Hydroxy: 24.4 ng/mL — ABNORMAL LOW (ref 30.0–100.0)

## 2023-05-06 ENCOUNTER — Ambulatory Visit (INDEPENDENT_AMBULATORY_CARE_PROVIDER_SITE_OTHER): Admitting: Family Medicine

## 2023-05-06 ENCOUNTER — Encounter: Payer: Self-pay | Admitting: Family Medicine

## 2023-05-06 VITALS — BP 104/65 | HR 80 | Temp 98.5°F | Ht 73.0 in | Wt 226.0 lb

## 2023-05-06 DIAGNOSIS — F411 Generalized anxiety disorder: Secondary | ICD-10-CM | POA: Diagnosis not present

## 2023-05-06 DIAGNOSIS — R109 Unspecified abdominal pain: Secondary | ICD-10-CM | POA: Diagnosis not present

## 2023-05-06 DIAGNOSIS — F4310 Post-traumatic stress disorder, unspecified: Secondary | ICD-10-CM

## 2023-05-06 DIAGNOSIS — R1031 Right lower quadrant pain: Secondary | ICD-10-CM | POA: Diagnosis not present

## 2023-05-06 LAB — URINALYSIS, ROUTINE W REFLEX MICROSCOPIC
Bilirubin, UA: NEGATIVE
Glucose, UA: NEGATIVE
Ketones, UA: NEGATIVE
Leukocytes,UA: NEGATIVE
Nitrite, UA: NEGATIVE
Protein,UA: NEGATIVE
RBC, UA: NEGATIVE
Specific Gravity, UA: >1.03 — ABNORMAL HIGH (ref 1.005–1.030)
Urobilinogen, Ur: 0.2 mg/dL (ref 0.2–1.0)
pH, UA: 6 (ref 5.0–7.5)

## 2023-05-06 NOTE — Progress Notes (Signed)
Subjective:  Patient ID: Henry Webb, male    DOB: 17-Dec-1970, 52 y.o.   MRN: [id number]  Patient Care Team: Sonny Masters, FNP as PCP - General (Family Medicine) Wyline Mood Dorothe Pea, MD as PCP - Cardiology (Cardiology) Jena Gauss Gerrit Friends, MD as Consulting Physician (Gastroenterology)   Chief Complaint:  Flank Pain (Possible kidney stone)  HPI: Richard Squyres is a 52 y.o. male presenting on 05/06/2023 for Flank Pain (Possible kidney stone)  Flank Pain   1. Flank pain  States that he has had multiple stones in the past. Believes that he passed this stone at this time. States that he had some bleeding last week. Reports pain in his lower back and hips. Reports pain from lower right quadrant of ab to right testicle. States that this one and the last stone both radiated down to his testicle. 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    Relevant past medical, surgical, family, and social history reviewed and updated as indicated.  Allergies and medications reviewed and updated. Data reviewed: Chart in Epic.   Past Medical History:  Diagnosis Date   Anxiety    Arthritis    CAD (coronary artery disease)    Depression    Fibromyalgia    GERD (gastroesophageal reflux disease)    History of gout    Kidney stones    Migraine    Morbid obesity (HCC)    Palpitations    Sarcoidosis    Tachycardia    Testicular hypofunction     Past Surgical History:  Procedure Laterality Date   COLONOSCOPY WITH PROPOFOL N/A 07/02/2022   Procedure: COLONOSCOPY WITH PROPOFOL;  Surgeon: Corbin Ade, MD;  [facility]: [facility];  Service: Endoscopy;  Laterality: N/A;  9:15 am   CORONARY PRESSURE/FFR STUDY N/A 07/31/2020   Procedure: INTRAVASCULAR PRESSURE WIRE/FFR STUDY;  Surgeon: Swaziland, Peter M, MD;  Location: [facility] INVASIVE CV LAB;  Service: Cardiovascular;  Laterality: N/A;   CORONARY STENT INTERVENTION N/A 07/31/2020   Procedure: CORONARY STENT INTERVENTION;   Surgeon: Swaziland, Peter M, MD;  Location: [facility] INVASIVE CV LAB;  Service: Cardiovascular;  Laterality: N/A;   ESOPHAGOGASTRODUODENOSCOPY (EGD) WITH PROPOFOL N/A 07/02/2022   Procedure: ESOPHAGOGASTRODUODENOSCOPY (EGD) WITH PROPOFOL;  Surgeon: Corbin Ade, MD;  Location: [facility];  Service: Endoscopy;  Laterality: N/A;   Fusion     c 3-5- cevical fusion   LEFT HEART CATH AND CORONARY ANGIOGRAPHY N/A 07/31/2020   Procedure: LEFT HEART CATH AND CORONARY ANGIOGRAPHY;  Surgeon: Swaziland, Peter M, MD;  Location: [facility] INVASIVE CV LAB;  Service: Cardiovascular;  Laterality: N/A;   LITHOTRIPSY     MALONEY DILATION N/A 07/02/2022   Procedure: Elease Hashimoto DILATION;  Surgeon: Corbin Ade, MD;  Location: [facility];  Service: Endoscopy;  Laterality: N/A;   POLYPECTOMY  07/02/2022   Procedure: POLYPECTOMY;  Surgeon: Corbin Ade, MD;  Location: [facility];  Service: Endoscopy;;    Social History   Socioeconomic History   Marital status: Married    Spouse name: Not on file   Number of children: 3   Years of education: Not on file   Highest education level: Not on file  Occupational History   Occupation: Retired    Comment: Previously in [employer] x 22 years  Tobacco Use   Smoking status: Former    Current packs/day: 0.00    Types: Cigarettes    Quit date: 1996    Years since  quitting: 28.9   Smokeless tobacco: Current    Types: Snuff  Vaping Use   Vaping status: Never Used  Substance and Sexual Activity   Alcohol use: Yes    Comment: rare - beer   Drug use: Yes    Types: Marijuana    Comment: use daily- smoke a bowl 5 x daily   Sexual activity: Yes  Other Topics Concern   Not on file  Social History Narrative   Not on file   Social Determinants of Health   Financial Resource Strain: Not on file  Food Insecurity: Not on file  Transportation Needs: Not on file  Physical Activity: Not on file  Stress: Not on file  Social Connections: Not on file  Intimate Partner  Violence: Not on file    Outpatient Encounter Medications as of 05/06/2023  Medication Sig   amLODipine (NORVASC) 5 MG tablet TAKE 1 TABLET(5 MG) BY MOUTH DAILY   aspirin EC 81 MG tablet Take 1 tablet (81 mg total) by mouth daily. Swallow whole.   cyclobenzaprine (FLEXERIL) 10 MG tablet Take 10 mg by mouth 3 (three) times daily as needed for muscle spasms.   isosorbide mononitrate (IMDUR) 30 MG 24 hr tablet TAKE 1 TABLET(30 MG) BY MOUTH DAILY   nitroGLYCERIN (NITROSTAT) 0.4 MG SL tablet ONE TABLET UNDER TONGUE AS NEEDED FOR CHEST PAIN   nystatin (MYCOSTATIN) 100000 UNIT/ML suspension Take 5 mLs by mouth 4 (four) times daily as needed.   pantoprazole (PROTONIX) 40 MG tablet Take 40 mg by mouth daily.   propranolol ER (INDERAL LA) 160 MG SR capsule TAKE 1 CAPSULE(160 MG) BY MOUTH DAILY   rosuvastatin (CRESTOR) 40 MG tablet TAKE 1 TABLET BY MOUTH EVERY DAY   sertraline (ZOLOFT) 100 MG tablet TAKE 1 TABLET BY MOUTH EVERY DAY   traZODone (DESYREL) 50 MG tablet Take 0.5-1 tablets (25-50 mg total) by mouth at bedtime as needed for sleep.   Venlafaxine HCl 225 MG TB24 Take 225 mg by mouth daily.   Vitamin D, Ergocalciferol, (DRISDOL) 1.25 MG (50000 UNIT) CAPS capsule Take 1 capsule (50,000 Units total) by mouth every 7 (seven) days.   No facility-administered encounter medications on file as of 05/06/2023.    Allergies  Allergen Reactions   Calcium-Containing Compounds     Flares sarcoidosis    Nsaids Other (See Comments)    High blood pressure    Review of Systems  Genitourinary:  Positive for flank pain.   As per HPI  Objective:  BP 104/65   Pulse 80   Temp 98.5 F (36.9 C)   Ht 6\' 1"  (1.854 m)   Wt 226 lb (102.5 kg)   SpO2 97%   BMI 29.82 kg/m    Wt Readings from Last 3 Encounters:  05/06/23 226 lb (102.5 kg)  03/17/23 226 lb 12.8 oz (102.9 kg)  02/13/23 227 lb 12.8 oz (103.3 kg)   Physical Exam Constitutional:      General: He is awake. He is not in acute distress.     Appearance: Normal appearance. He is well-developed and well-groomed. He is not ill-appearing, toxic-appearing or diaphoretic.  Cardiovascular:     Rate and Rhythm: Normal rate and regular rhythm.     Pulses: Normal pulses.          Radial pulses are 2+ on the right side and 2+ on the left side.       Posterior tibial pulses are 2+ on the right side and 2+ on the left side.  Heart sounds: Normal heart sounds. No murmur heard.    No gallop.  Pulmonary:     Effort: Pulmonary effort is normal. No respiratory distress.     Breath sounds: Normal breath sounds. No stridor. No wheezing, rhonchi or rales.  Abdominal:     General: Abdomen is flat. Bowel sounds are normal. There is no distension.     Palpations: Abdomen is soft. There is no mass.     Tenderness: There is abdominal tenderness in the right lower quadrant. There is no right CVA tenderness, left CVA tenderness, guarding or rebound.     Hernia: No hernia is present.       Comments: Tender to palpation RLQ  Musculoskeletal:     Cervical back: Full passive range of motion without pain and neck supple.     Right lower leg: No edema.     Left lower leg: No edema.  Skin:    General: Skin is warm.     Capillary Refill: Capillary refill takes less than 2 seconds.  Neurological:     General: No focal deficit present.     Mental Status: He is alert, oriented to person, place, and time and easily aroused. Mental status is at baseline.     GCS: GCS eye subscore is 4. GCS verbal subscore is 5. GCS motor subscore is 6.     Motor: No weakness.  Psychiatric:        Attention and Perception: Attention and perception normal.        Mood and Affect: Mood and affect normal.        Speech: Speech normal.        Behavior: Behavior normal. Behavior is cooperative.        Thought Content: Thought content normal. Thought content does not include homicidal or suicidal ideation. Thought content does not include homicidal or suicidal plan.         Cognition and Memory: Cognition and memory normal.        Judgment: Judgment normal.     Results for orders placed or performed in visit on 03/17/23  CMP14+EGFR  Result Value Ref Range   Glucose 75 70 - 99 mg/dL   BUN 16 6 - 24 mg/dL   Creatinine, Ser 4.69 0.76 - 1.27 mg/dL   eGFR 72 >62 XB/MWU/1.32   BUN/Creatinine Ratio 13 9 - 20   Sodium 141 134 - 144 mmol/L   Potassium 4.6 3.5 - 5.2 mmol/L   Chloride 102 96 - 106 mmol/L   CO2 26 20 - 29 mmol/L   Calcium 9.8 8.7 - 10.2 mg/dL   Total Protein 7.0 6.0 - 8.5 g/dL   Albumin 4.6 3.8 - 4.9 g/dL   Globulin, Total 2.4 1.5 - 4.5 g/dL   Bilirubin Total 0.3 0.0 - 1.2 mg/dL   Alkaline Phosphatase 95 44 - 121 IU/L   AST 15 0 - 40 IU/L   ALT 18 0 - 44 IU/L  CBC with Differential/Platelet  Result Value Ref Range   WBC 10.2 3.4 - 10.8 x10E3/uL   RBC 4.79 4.14 - 5.80 x10E6/uL   Hemoglobin 14.3 13.0 - 17.7 g/dL   Hematocrit 44.0 10.2 - 51.0 %   MCV 89 79 - 97 fL   MCH 29.9 26.6 - 33.0 pg   MCHC 33.4 31.5 - 35.7 g/dL   RDW 72.5 36.6 - 44.0 %   Platelets 389 150 - 450 x10E3/uL   Neutrophils 59 Not Estab. %   Lymphs 25 Not Estab. %  Monocytes 10 Not Estab. %   Eos 4 Not Estab. %   Basos 1 Not Estab. %   Neutrophils Absolute 6.1 1.4 - 7.0 x10E3/uL   Lymphocytes Absolute 2.6 0.7 - 3.1 x10E3/uL   Monocytes Absolute 1.0 (H) 0.1 - 0.9 x10E3/uL   EOS (ABSOLUTE) 0.5 (H) 0.0 - 0.4 x10E3/uL   Basophils Absolute 0.1 0.0 - 0.2 x10E3/uL   Immature Granulocytes 1 Not Estab. %   Immature Grans (Abs) 0.1 0.0 - 0.1 x10E3/uL  Lipid panel  Result Value Ref Range   Cholesterol, Total 156 100 - 199 mg/dL   Triglycerides 427 (H) 0 - 149 mg/dL   HDL 37 (L) >06 mg/dL   VLDL Cholesterol Cal 34 5 - 40 mg/dL   LDL Chol Calc (NIH) 85 0 - 99 mg/dL   Chol/HDL Ratio 4.2 0.0 - 5.0 ratio  Thyroid Panel With TSH  Result Value Ref Range   TSH 1.620 0.450 - 4.500 uIU/mL   T4, Total 6.9 4.5 - 12.0 ug/dL   T3 Uptake Ratio 21 (L) 24 - 39 %   Free Thyroxine  Index 1.4 1.2 - 4.9  VITAMIN D 25 Hydroxy (Vit-D Deficiency, Fractures)  Result Value Ref Range   Vit D, 25-Hydroxy 24.4 (L) 30.0 - 100.0 ng/mL  Bayer DCA Hb A1c Waived  Result Value Ref Range   HB A1C (BAYER DCA - WAIVED) 5.6 4.8 - 5.6 %       05/06/2023    1:06 PM 03/17/2023    2:55 PM 11/06/2022    2:10 PM 11/03/2022   10:35 AM  Depression screen PHQ 2/9  Decreased Interest 1 2 3    Down, Depressed, Hopeless 3 2 3    PHQ - 2 Score 4 4 6    Altered sleeping 1 3 0   Tired, decreased energy 2 0 1   Change in appetite 0 0 0   Feeling bad or failure about yourself  1 1 1    Trouble concentrating 2 2 3    Moving slowly or fidgety/restless 0 0 2   Suicidal thoughts 0 0 0   PHQ-9 Score 10 10 13    Difficult doing work/chores Very difficult Somewhat difficult Not difficult at all      Information is confidential and restricted. Go to Review Flowsheets to unlock data.       05/06/2023    1:07 PM 03/17/2023    2:56 PM 11/06/2022    2:10 PM  GAD 7 : Generalized Anxiety Score  Nervous, Anxious, on Edge 0 1 1  Control/stop worrying 0 0 0  Worry too much - different things 0 0 0  Trouble relaxing 2 3 2   Restless 1 2 1   Easily annoyed or irritable 1 0 0  Afraid - awful might happen  0 0  Total GAD 7 Score  6 4  Anxiety Difficulty Somewhat difficult Somewhat difficult Not difficult at all    Pertinent labs & imaging results that were available during my care of the patient were reviewed by me and considered in my medical decision making.  Assessment & Plan:  Iona Beard was seen today for flank pain.  Diagnoses and all orders for this visit:  Flank pain Based on labs, will not start abx at this time. Will order imaging as below. Will communicate results to patient once available. Will await results to determine next steps. Discussed red flag symptoms with patient. Reviewed CT scan from 04/16/21. Reviewed notes from urology, Pete Glatter, MD on 06/25/21.  -  Urinalysis, Routine w  reflex microscopic -     Urine Culture -     CT RENAL STONE STUDY; Future  CT ABDOMEN PELVIS W WO CONTRAST  IMPRESSION: 1. Small bilateral nonobstructive renal calculi. No ureteral calculi or hydronephrosis. 2. No mass or suspicious contrast enhancement. 3. Limited opacification of the distal ureters. Within this limitation, no evidence of urinary tract filling defect on delayed phase imaging. 4. Coronary artery disease.     Electronically Signed   By: Jearld Lesch M.D.   On: 04/17/2021 08:48  Right lower quadrant abdominal pain As above.  -     CT RENAL STONE STUDY; Future  Generalized anxiety disorder Symptoms stable. Denies SI. Patient to follow up with PCP.   PTSD (post-traumatic stress disorder) Symptoms stable. Denies SI. Patient to follow up with PCP.     Continue all other maintenance medications.  Follow up plan: Return if symptoms worsen or fail to improve.   Continue healthy lifestyle choices, including diet (rich in fruits, vegetables, and lean proteins, and low in salt and simple carbohydrates) and exercise (at least 30 minutes of moderate physical activity daily).  Written and verbal instructions provided   The above assessment and management plan was discussed with the patient. The patient verbalized understanding of and has agreed to the management plan. Patient is aware to call the clinic if they develop any new symptoms or if symptoms persist or worsen. Patient is aware when to return to the clinic for a follow-up visit. Patient educated on when it is appropriate to go to the emergency department.   Neale Burly, DNP-FNP [facility] [address] [place], [place] [address] [phone]

## 2023-05-07 ENCOUNTER — Ambulatory Visit: Admitting: Family Medicine

## 2023-05-07 ENCOUNTER — Encounter: Payer: Self-pay | Admitting: Family Medicine

## 2023-05-07 VITALS — BP 118/75 | HR 83 | Temp 97.0°F | Ht 73.0 in | Wt 224.8 lb

## 2023-05-07 DIAGNOSIS — M791 Myalgia, unspecified site: Secondary | ICD-10-CM

## 2023-05-07 LAB — URINE CULTURE: Organism ID, Bacteria: NO GROWTH

## 2023-05-07 MED ORDER — METHYLPREDNISOLONE ACETATE 40 MG/ML IJ SUSP
40.0000 mg | Freq: Once | INTRAMUSCULAR | Status: AC
Start: 2023-05-07 — End: 2023-05-07
  Administered 2023-05-07: 40 mg via INTRAMUSCULAR

## 2023-05-07 NOTE — Progress Notes (Signed)
Subjective:  Patient ID: Henry Webb, male    DOB: 06-09-1970, 52 y.o.   MRN: [id number]  Patient Care Team: Sonny Masters, FNP as PCP - General (Family Medicine) Wyline Mood, Dorothe Pea, MD as PCP - Cardiology (Cardiology) Jena Gauss Gerrit Friends, MD as Consulting Physician (Gastroenterology)   Chief Complaint:  trigger point injections  (Lower back )   HPI: Henry Webb is a 52 y.o. male presenting on 05/07/2023 for trigger point injections  (Lower back )   Discussed the use of AI scribe software for clinical note transcription with the patient, who gave verbal consent to proceed.  History of Present Illness   The patient presents with chronic, intermittent lower back pain, characterized by palpable knots or trigger points. The pain is bilateral but seems to be more prominent on the right side. The intensity of the pain varies, with some days being worse than others. The patient has been experiencing these symptoms for an unspecified duration, suggesting a chronic condition.  The patient reports that the pain is most severe upon waking up in the morning, but improves with stretching. Despite this, the patient notes that if the knots are not adequately stretched out, they can cause significant discomfort and instability, to the point of nearly causing falls.  The patient also reports a history of a torn left hip, but clarifies that the current pain does not resemble that previous injury. Instead, the patient describes the pain as being located in the outer portion of the hip, suggesting possible nerve involvement. The patient's narrative suggests that the pain may be due to muscle tension and inflammation.          Relevant past medical, surgical, family, and social history reviewed and updated as indicated.  Allergies and medications reviewed and updated. Data reviewed: Chart in Epic.   Past Medical History:  Diagnosis Date   Anxiety    Arthritis    CAD (coronary  artery disease)    Depression    Fibromyalgia    GERD (gastroesophageal reflux disease)    History of gout    Kidney stones    Migraine    Morbid obesity (HCC)    Palpitations    Sarcoidosis    Tachycardia    Testicular hypofunction     Past Surgical History:  Procedure Laterality Date   COLONOSCOPY WITH PROPOFOL N/A 07/02/2022   Procedure: COLONOSCOPY WITH PROPOFOL;  Surgeon: Corbin Ade, MD;  [facility]: [facility];  Service: Endoscopy;  Laterality: N/A;  9:15 am   CORONARY PRESSURE/FFR STUDY N/A 07/31/2020   Procedure: INTRAVASCULAR PRESSURE WIRE/FFR STUDY;  Surgeon: Swaziland, Peter M, MD;  Location: [facility] INVASIVE CV LAB;  Service: Cardiovascular;  Laterality: N/A;   CORONARY STENT INTERVENTION N/A 07/31/2020   Procedure: CORONARY STENT INTERVENTION;  Surgeon: Swaziland, Peter M, MD;  Location: [facility] INVASIVE CV LAB;  Service: Cardiovascular;  Laterality: N/A;   ESOPHAGOGASTRODUODENOSCOPY (EGD) WITH PROPOFOL N/A 07/02/2022   Procedure: ESOPHAGOGASTRODUODENOSCOPY (EGD) WITH PROPOFOL;  Surgeon: Corbin Ade, MD;  Location: [facility];  Service: Endoscopy;  Laterality: N/A;   Fusion     c 3-5- cevical fusion   LEFT HEART CATH AND CORONARY ANGIOGRAPHY N/A 07/31/2020   Procedure: LEFT HEART CATH AND CORONARY ANGIOGRAPHY;  Surgeon: Swaziland, Peter M, MD;  Location: [facility] INVASIVE CV LAB;  Service: Cardiovascular;  Laterality: N/A;   LITHOTRIPSY     MALONEY DILATION N/A 07/02/2022   Procedure: Elease Hashimoto DILATION;  Surgeon: Corbin Ade, MD;  Location: [facility];  Service: Endoscopy;  Laterality: N/A;   POLYPECTOMY  07/02/2022   Procedure: POLYPECTOMY;  Surgeon: Corbin Ade, MD;  Location: [facility];  Service: Endoscopy;;    Social History   Socioeconomic History   Marital status: Married    Spouse name: Not on file   Number of children: 3   Years of education: Not on file   Highest education level: Not on file  Occupational History   Occupation: Retired    Comment:  Previously in [employer] x 22 years  Tobacco Use   Smoking status: Former    Current packs/day: 0.00    Types: Cigarettes    Quit date: 1996    Years since quitting: 28.9   Smokeless tobacco: Current    Types: Snuff  Vaping Use   Vaping status: Never Used  Substance and Sexual Activity   Alcohol use: Yes    Comment: rare - beer   Drug use: Yes    Types: Marijuana    Comment: use daily- smoke a bowl 5 x daily   Sexual activity: Yes  Other Topics Concern   Not on file  Social History Narrative   Not on file   Social Drivers of Health   Financial Resource Strain: Not on file  Food Insecurity: Not on file  Transportation Needs: Not on file  Physical Activity: Not on file  Stress: Not on file  Social Connections: Not on file  Intimate Partner Violence: Not on file    Outpatient Encounter Medications as of 05/07/2023  Medication Sig   amLODipine (NORVASC) 5 MG tablet TAKE 1 TABLET(5 MG) BY MOUTH DAILY   aspirin EC 81 MG tablet Take 1 tablet (81 mg total) by mouth daily. Swallow whole.   cyclobenzaprine (FLEXERIL) 10 MG tablet Take 10 mg by mouth 3 (three) times daily as needed for muscle spasms.   isosorbide mononitrate (IMDUR) 30 MG 24 hr tablet TAKE 1 TABLET(30 MG) BY MOUTH DAILY   nitroGLYCERIN (NITROSTAT) 0.4 MG SL tablet ONE TABLET UNDER TONGUE AS NEEDED FOR CHEST PAIN   nystatin (MYCOSTATIN) 100000 UNIT/ML suspension Take 5 mLs by mouth 4 (four) times daily as needed.   pantoprazole (PROTONIX) 40 MG tablet Take 40 mg by mouth daily.   propranolol ER (INDERAL LA) 160 MG SR capsule TAKE 1 CAPSULE(160 MG) BY MOUTH DAILY   rosuvastatin (CRESTOR) 40 MG tablet TAKE 1 TABLET BY MOUTH EVERY DAY   sertraline (ZOLOFT) 100 MG tablet TAKE 1 TABLET BY MOUTH EVERY DAY   traZODone (DESYREL) 50 MG tablet Take 0.5-1 tablets (25-50 mg total) by mouth at bedtime as needed for sleep.   Venlafaxine HCl 225 MG TB24 Take 225 mg by mouth daily.   Vitamin D, Ergocalciferol, (DRISDOL) 1.25  MG (50000 UNIT) CAPS capsule Take 1 capsule (50,000 Units total) by mouth every 7 (seven) days.   [EXPIRED] methylPREDNISolone acetate (DEPO-MEDROL) injection 40 mg    [EXPIRED] methylPREDNISolone acetate (DEPO-MEDROL) injection 40 mg    No facility-administered encounter medications on file as of 05/07/2023.    Allergies  Allergen Reactions   Calcium-Containing Compounds     Flares sarcoidosis    Nsaids Other (See Comments)    High blood pressure    Pertinent ROS per HPI, otherwise unremarkable      Objective:  BP 118/75   Pulse 83   Temp (!) 97 F (36.1 C)   Ht 6\' 1"  (1.854 m)   Wt 224 lb 12.8 oz (102  kg)   SpO2 95%   BMI 29.66 kg/m    Wt Readings from Last 3 Encounters:  05/07/23 224 lb 12.8 oz (102 kg)  05/06/23 226 lb (102.5 kg)  03/17/23 226 lb 12.8 oz (102.9 kg)    Physical Exam Vitals and nursing note reviewed.  Constitutional:      General: He is not in acute distress.    Appearance: Normal appearance. He is not ill-appearing, toxic-appearing or diaphoretic.  HENT:     Head: Normocephalic and atraumatic.     Nose: Nose normal.     Mouth/Throat:     Mouth: Mucous membranes are moist.  Eyes:     Pupils: Pupils are equal, round, and reactive to light.  Cardiovascular:     Rate and Rhythm: Normal rate and regular rhythm.     Heart sounds: Normal heart sounds.  Pulmonary:     Effort: Pulmonary effort is normal.     Breath sounds: Normal breath sounds.  Musculoskeletal:     Cervical back: Neck supple.     Lumbar back: Spasms and tenderness present. No edema, signs of trauma, lacerations or bony tenderness. Normal range of motion. Negative right straight leg raise test and negative left straight leg raise test. No scoliosis.       Back:     Right lower leg: No edema.     Left lower leg: No edema.  Skin:    General: Skin is warm and dry.     Capillary Refill: Capillary refill takes less than 2 seconds.  Neurological:     General: No focal deficit  present.     Mental Status: He is alert and oriented to person, place, and time.  Psychiatric:        Mood and Affect: Mood normal.        Behavior: Behavior normal.        Thought Content: Thought content normal.        Judgment: Judgment normal.    Physical Exam   MUSCULOSKELETAL: Multiple trigger points palpated in the right lower back, with one notable trigger point on the left lower back eliciting significant discomfort upon palpation.      Trigger point injection to left and right lower lumbar regions. Aseptic technique used. Written consent obtained prior to injections, risks and benefits discussed in detail prior to consent. Both sites injected with 40 mg depo-medrol using a 25G 1 1/2 inch needle. Pt tolerated well. No immediate complications.    Results for orders placed or performed in visit on 05/06/23  Urinalysis, Routine w reflex microscopic   Collection Time: 05/06/23  1:29 PM  Result Value Ref Range   Specific Gravity, UA >1.030 (H) 1.005 - 1.030   pH, UA 6.0 5.0 - 7.5   Color, UA Yellow Yellow   Appearance Ur Clear Clear   Leukocytes,UA Negative Negative   Protein,UA Negative Negative/Trace   Glucose, UA Negative Negative   Ketones, UA Negative Negative   RBC, UA Negative Negative   Bilirubin, UA Negative Negative   Urobilinogen, Ur 0.2 0.2 - 1.0 mg/dL   Nitrite, UA Negative Negative       Pertinent labs & imaging results that were available during my care of the patient were reviewed by me and considered in my medical decision making.  Assessment & Plan:  Iona Beard was seen today for trigger point injections .  Diagnoses and all orders for this visit:  Trigger point -     methylPREDNISolone acetate (DEPO-MEDROL) injection 40  mg -     methylPREDNISolone acetate (DEPO-MEDROL) injection 40 mg     Assessment and Plan    Myofascial Pain Syndrome Chronic myofascial pain with trigger points in the lower back and hip regions, exacerbated by physical activity.  Significant morning discomfort improves with stretching. Occasional radiating pain may cause instability. Informed consent obtained, discussing risks (bleeding, bruising, increased pain, infection) and benefits (pain relief, reduction of muscle knots). Expected relief by day three post-injection. - Administer 80 mg Depo-Medrol, 40 mg per trigger point injection - Obtain signed trigger point injection consent - Educate on risks (bleeding, bruising, increased pain, infection) and benefits (pain relief, reduction of muscle knots) - Monitor for relief by day three post-injection.          Continue all other maintenance medications.  Follow up plan: Return if symptoms worsen or fail to improve.   Continue healthy lifestyle choices, including diet (rich in fruits, vegetables, and lean proteins, and low in salt and simple carbohydrates) and exercise (at least 30 minutes of moderate physical activity daily).  Educational handout given for trigger point injections   The above assessment and management plan was discussed with the patient. The patient verbalized understanding of and has agreed to the management plan. Patient is aware to call the clinic if they develop any new symptoms or if symptoms persist or worsen. Patient is aware when to return to the clinic for a follow-up visit. Patient educated on when it is appropriate to go to the emergency department.   Kari Baars, FNP-C [facility] [phone]

## 2023-05-08 NOTE — Progress Notes (Signed)
Negative for UTI

## 2023-05-12 ENCOUNTER — Other Ambulatory Visit: Payer: Self-pay | Admitting: Family

## 2023-05-13 ENCOUNTER — Ambulatory Visit (HOSPITAL_COMMUNITY)
Admission: RE | Admit: 2023-05-13 | Discharge: 2023-05-13 | Disposition: A | Discharge status: Home / Self Care | Source: Ambulatory Visit | Attending: Family Medicine | Admitting: Family Medicine

## 2023-05-13 DIAGNOSIS — R109 Unspecified abdominal pain: Secondary | ICD-10-CM | POA: Insufficient documentation

## 2023-05-13 DIAGNOSIS — R1031 Right lower quadrant pain: Secondary | ICD-10-CM | POA: Insufficient documentation

## 2023-05-25 ENCOUNTER — Telehealth: Payer: Self-pay

## 2023-05-25 NOTE — Telephone Encounter (Signed)
Patient aware CT is not back yet.

## 2023-05-25 NOTE — Telephone Encounter (Signed)
Copied from CRM [id number]. Topic: Clinical - Lab/Test Results >> May 25, 2023  1:19 PM Fonda Kinder J wrote: Reason for CRM: Pt is requesting a call back to go over 12/18 CT imaging

## 2023-05-28 ENCOUNTER — Other Ambulatory Visit: Payer: Self-pay | Admitting: Cardiology

## 2023-05-28 NOTE — Telephone Encounter (Signed)
 Refill completes.

## 2023-06-25 IMAGING — CT CT ABD-PEL WO/W CM
3 of 11 series · 11 of 46 positions shown, 17 images · IV contrast (Omnipaque or Isovue)
Comparison: 06/02/2017

CLINICAL DATA: Right-sided flank pain, hematuria 3 months, history
of kidney stones

EXAM:
CT ABDOMEN AND PELVIS WITHOUT AND WITH CONTRAST
TECHNIQUE: Multidetector CT imaging of the abdomen and pelvis was performed
following the standard protocol before and following the bolus
administration of intravenous contrast.
CONTRAST:  100mL OMNIPAQUE IOHEXOL 300 MG/ML  SOLN

[Series 3: axial pre · axial · non-contrast · 0.92mm/px · z∈[+972,+1287]mm · 4 of 106 slices shown]
[im 22/106  soft-tissue]
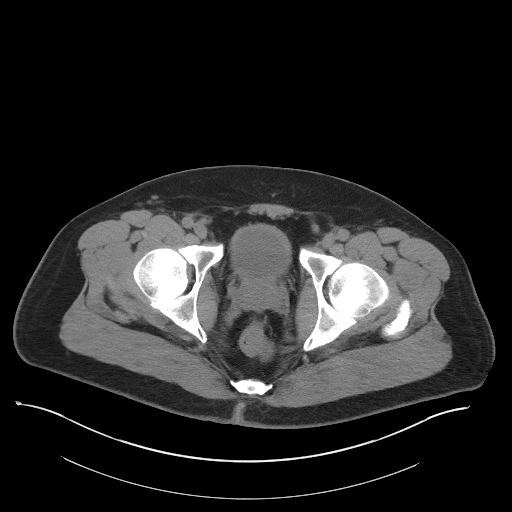
[im 43/106  soft-tissue]
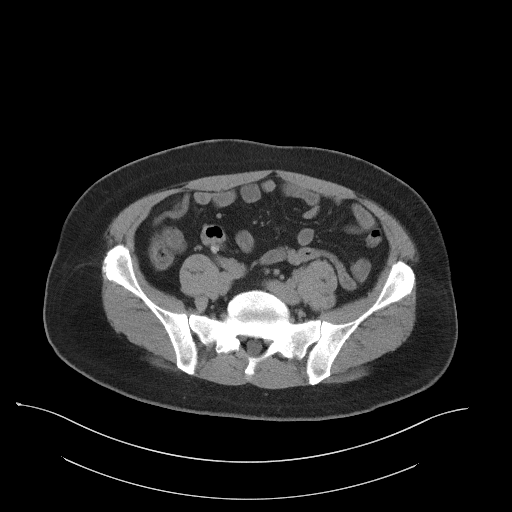
[im 64/106  soft-tissue]
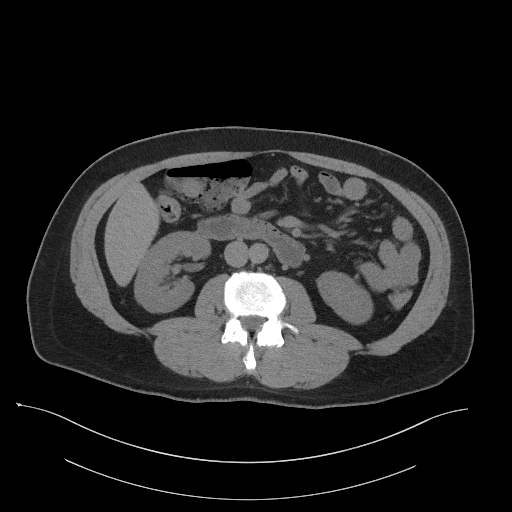
[im 85/106  soft-tissue]
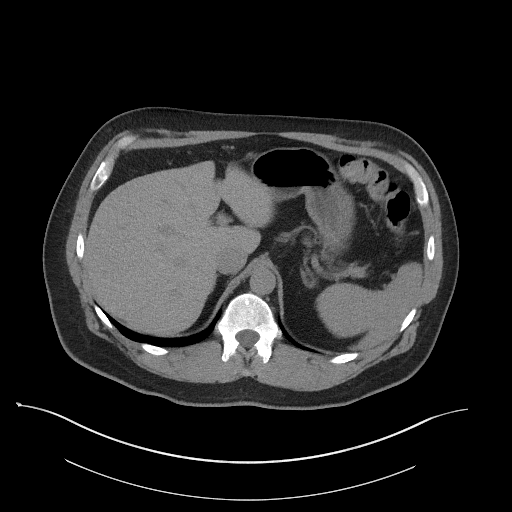

[Series 6: coronal pre · coronal · non-contrast · 0.94mm/px · 2 of 101 slices shown, 3 images]
[im 34/101  soft-tissue]
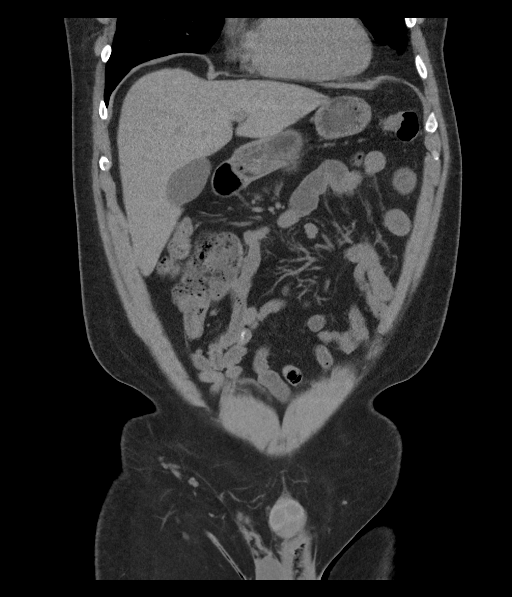
[im 34/101  bone]
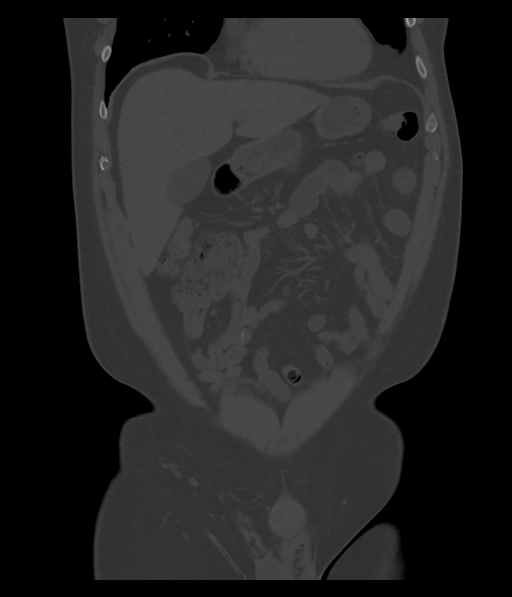
[im 67/101  soft-tissue]
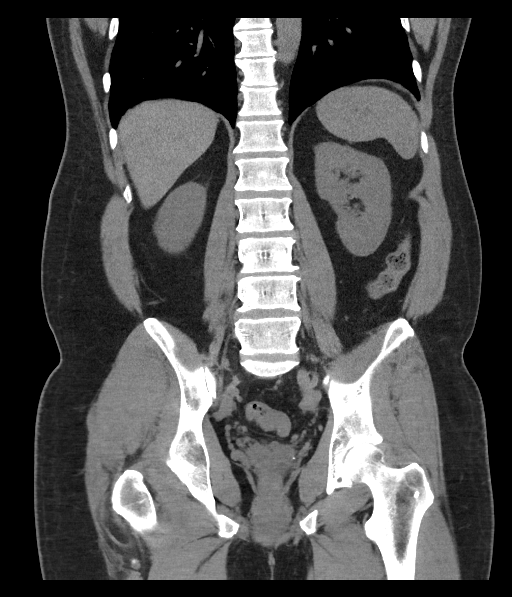

[Series 13: axial delay · axial · delayed · 0.87mm/px · z∈[+1011,+1391]mm · 5 of 114 slices shown, 10 images]
[im 19/114  soft-tissue]
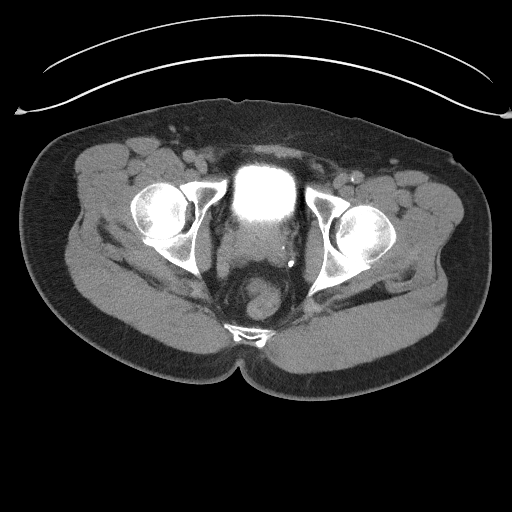
[im 19/114  bone]
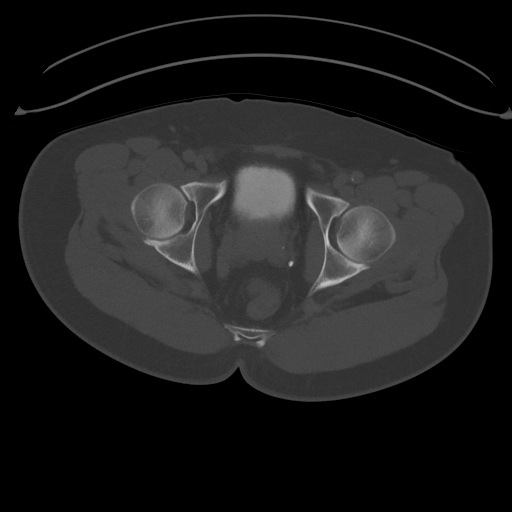
[im 38/114  soft-tissue]
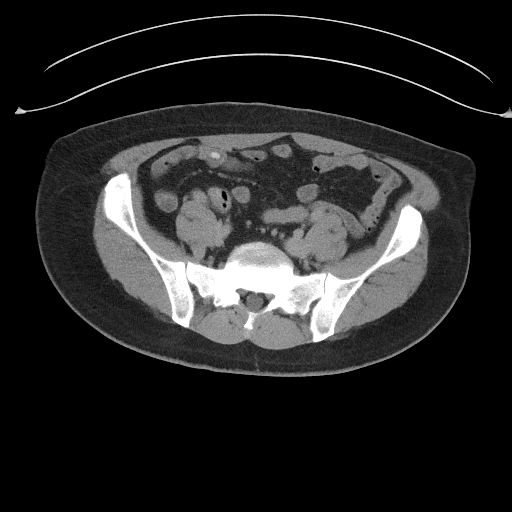
[im 38/114  lung]
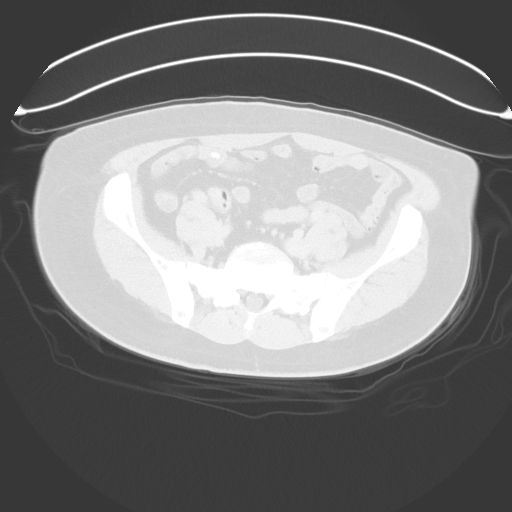
[im 57/114  soft-tissue]
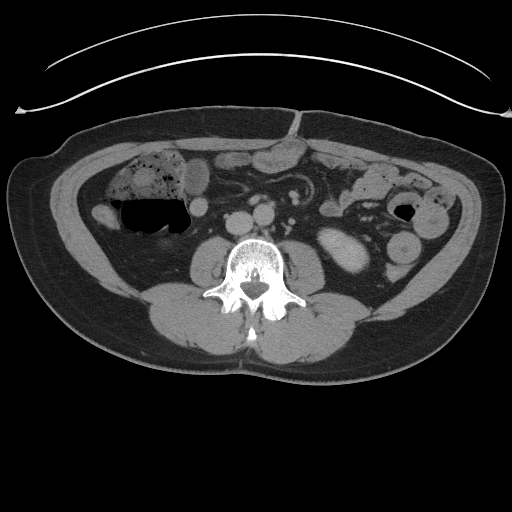
[im 57/114  lung]
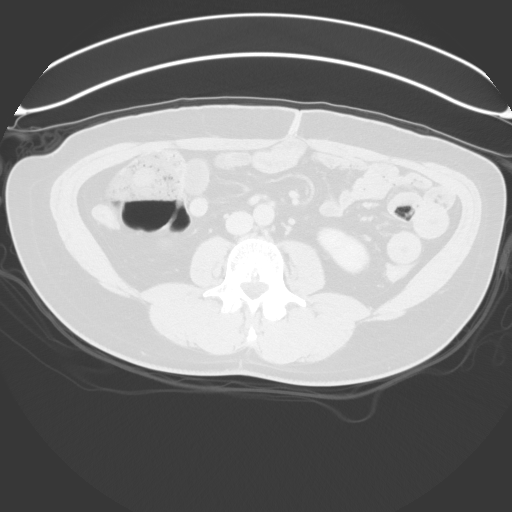
[im 76/114  soft-tissue]
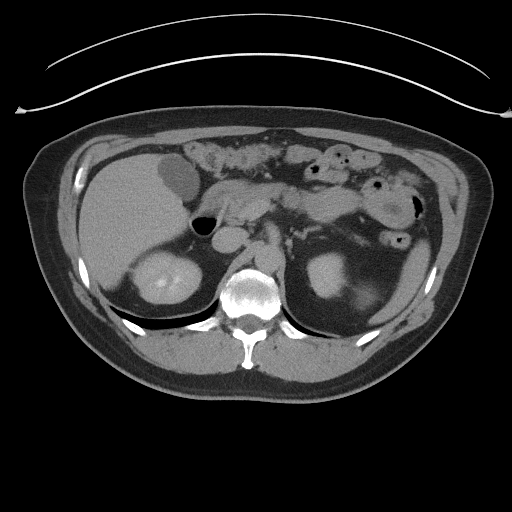
[im 76/114  lung]
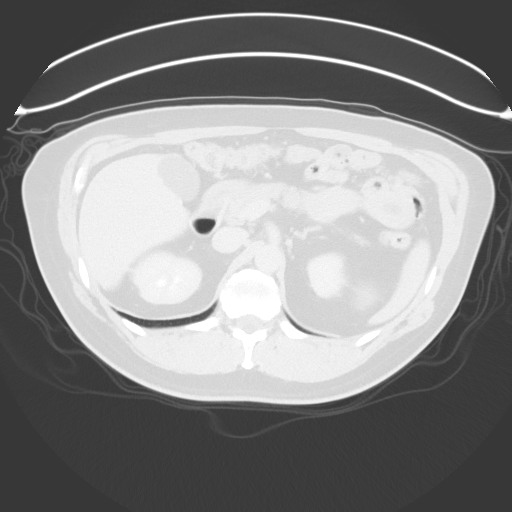
[im 95/114  soft-tissue]
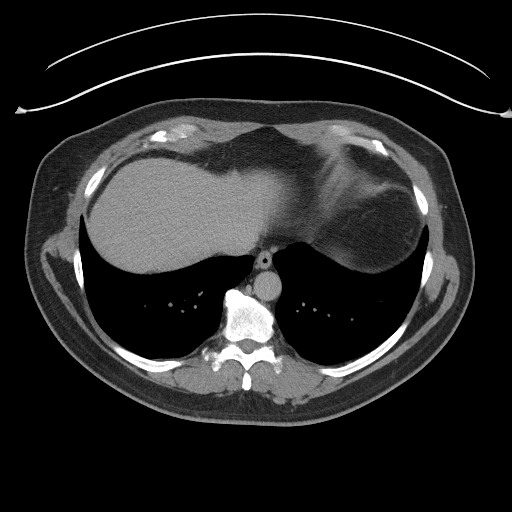
[im 95/114  lung]
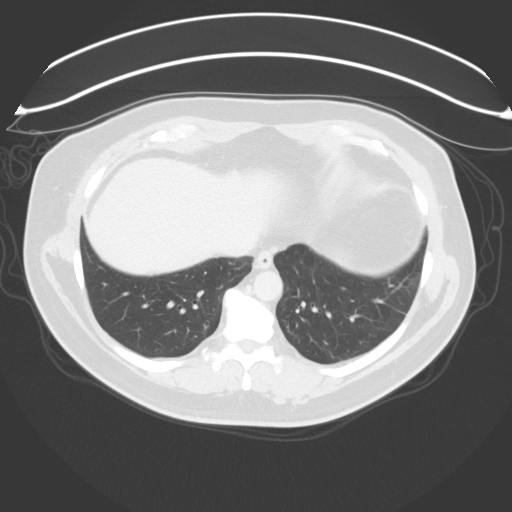

[11 of 46 positions shown; findings below may reference images not displayed]

FINDINGS: Lower chest: No acute abnormality. Coronary artery calcifications
and stents.

Hepatobiliary: No solid liver abnormality is seen. No gallstones,
gallbladder wall thickening, or biliary dilatation.

Pancreas: Unremarkable. No pancreatic ductal dilatation or
surrounding inflammatory changes.

Spleen: Normal in size without significant abnormality.

Adrenals/Urinary Tract: Adrenal glands are unremarkable. Small
bilateral nonobstructive renal calculi. No ureteral calculi or
hydronephrosis. Limited opacification of the distal ureters. Within
this limitation, no evidence of urinary tract filling defect on
delayed phase imaging. Bladder is unremarkable.

Stomach/Bowel: Stomach is within normal limits. Appendix appears
normal. No evidence of bowel wall thickening, distention, or
inflammatory changes.

Vascular/Lymphatic: No significant vascular findings are present. No
enlarged abdominal or pelvic lymph nodes.

Reproductive: No mass or other significant abnormality.

Other: No abdominal wall hernia or abnormality. No abdominopelvic
ascites.

Musculoskeletal: No acute or significant osseous findings.
IMPRESSION: 1. Small bilateral nonobstructive renal calculi. No ureteral calculi
or hydronephrosis.
2. No mass or suspicious contrast enhancement.
3. Limited opacification of the distal ureters. Within this
limitation, no evidence of urinary tract filling defect on delayed
phase imaging.
4. Coronary artery disease.

## 2023-07-03 ENCOUNTER — Other Ambulatory Visit: Payer: Self-pay | Admitting: Family Medicine

## 2023-07-03 NOTE — Telephone Encounter (Signed)
 Last Fill: Unknown  Last OV: 05/07/23 Next OV: 07/08/23  Routing to provider for review/authorization.

## 2023-07-03 NOTE — Telephone Encounter (Signed)
 Copied from CRM [id number]. Topic: Clinical - Medication Refill >> Jul 03, 2023 12:51 PM Henry Webb wrote: Most Recent Primary Care Visit:  Provider: [name]  Department: Henry Webb  Visit Type: ACUTE  Date: 05/07/2023  Medication: Venlafaxine  HCl 225 MG TB24  Has the patient contacted their pharmacy? Yes (Agent: If no, request that the patient contact the pharmacy for the refill. If patient does not wish to contact the pharmacy document the reason why and proceed with request.) (Agent: If yes, when and what did the pharmacy advise?)  Is this the correct pharmacy for this prescription? Yes If no, delete pharmacy and type the correct one.  This is the patient's preferred pharmacy:  [facility] [phone] - [place], [place] - [address] AT [facility] [address] [place] [address] Phone: [phone] Fax: [phone]    Has the prescription been filled recently? Yes  Is the patient out of the medication? Yes  Has the patient been seen for an appointment in the last year OR does the patient have an upcoming appointment? Yes  Can we respond through MyChart? Yes  Agent: Please be advised that Rx refills may take up to 3 business days. We ask that you follow-up with your pharmacy.

## 2023-07-08 ENCOUNTER — Ambulatory Visit: Admitting: Family Medicine

## 2023-07-09 ENCOUNTER — Ambulatory Visit (HOSPITAL_COMMUNITY)
Admission: RE | Admit: 2023-07-09 | Discharge: 2023-07-09 | Disposition: A | Discharge status: Home / Self Care | Source: Ambulatory Visit | Attending: Family Medicine | Admitting: Family Medicine

## 2023-07-09 ENCOUNTER — Ambulatory Visit (INDEPENDENT_AMBULATORY_CARE_PROVIDER_SITE_OTHER): Admitting: Family Medicine

## 2023-07-09 ENCOUNTER — Encounter: Payer: Self-pay | Admitting: Family Medicine

## 2023-07-09 VITALS — BP 120/77 | HR 88 | Temp 98.0°F | Ht 73.0 in | Wt 226.2 lb

## 2023-07-09 DIAGNOSIS — N50811 Right testicular pain: Secondary | ICD-10-CM

## 2023-07-09 DIAGNOSIS — F4310 Post-traumatic stress disorder, unspecified: Secondary | ICD-10-CM | POA: Diagnosis not present

## 2023-07-09 DIAGNOSIS — K219 Gastro-esophageal reflux disease without esophagitis: Secondary | ICD-10-CM

## 2023-07-09 DIAGNOSIS — R1031 Right lower quadrant pain: Secondary | ICD-10-CM

## 2023-07-09 DIAGNOSIS — F411 Generalized anxiety disorder: Secondary | ICD-10-CM

## 2023-07-09 DIAGNOSIS — F339 Major depressive disorder, recurrent, unspecified: Secondary | ICD-10-CM

## 2023-07-09 MED ORDER — VENLAFAXINE HCL ER 225 MG PO TB24
225.0000 mg | ORAL_TABLET | Freq: Every day | ORAL | 1 refills | Status: DC
Start: 2023-07-09 — End: 2024-02-22

## 2023-07-09 MED ORDER — PANTOPRAZOLE SODIUM 40 MG PO TBEC
40.0000 mg | DELAYED_RELEASE_TABLET | Freq: Every day | ORAL | 1 refills | Status: DC
Start: 2023-07-09 — End: 2024-01-07

## 2023-07-09 MED ORDER — SERTRALINE HCL 100 MG PO TABS
100.0000 mg | ORAL_TABLET | Freq: Every day | ORAL | 1 refills | Status: DC
Start: 2023-07-09 — End: 2024-02-09

## 2023-07-09 NOTE — Addendum Note (Signed)
Addended by: Sonny Masters on: 07/09/2023 03:44 PM   Modules accepted: Orders

## 2023-07-09 NOTE — Progress Notes (Addendum)
 Subjective:  Patient ID: Henry Webb, male    DOB: 10-01-1970, 53 y.o.   MRN: [id number]  Patient Care Team: Severa Rock HERO, FNP as PCP - General (Family Medicine) Alvan, Dorn FALCON, MD as PCP - Cardiology (Cardiology) Shaaron Lamar HERO, MD as Consulting Physician (Gastroenterology)   Chief Complaint:  Testicle Pain (Right testicle pain x 3 years )   HPI: Henry Webb is a 53 y.o. male presenting on 07/09/2023 for Testicle Pain (Right testicle pain x 3 years )   Discussed the use of AI scribe software for clinical note transcription with the patient, who gave verbal consent to proceed.  History of Present Illness   Henry Webb is a 53 year old male who presents with chronic right groin pain radiating to the right testicle.  He experiences chronic pain in the right groin area that radiates to the right testicle, with a sensation of the testicle being pulled up inside him. The pain intensifies during erections and has been persistent for a long time. He previously underwent an ultrasound, which did not reveal any abnormalities, and was evaluated by a urologist approximately two to three years ago. During that time, he also had a kidney stone, which has since resolved, but the groin and testicular pain persisted.  He occasionally notices blood in his urine, which he associates with passing kidney stones, although this does not occur all the time. No swelling, lumps, bumps, or discharge from the testicle.  He is experiencing worsening depression over the past few weeks, particularly after his son, who was supposed to move in with him, left unexpectedly. He describes feeling significantly more emotional, stating, 'If I'm not doing physical work or act, something I can occupy my mind, I'm pretty much crying these days.'           05/06/2023    1:07 PM 03/17/2023    2:56 PM 11/06/2022    2:10 PM  GAD 7 : Generalized Anxiety Score  Nervous, Anxious, on Edge 0 1  1  Control/stop worrying 0 0 0  Worry too much - different things 0 0 0  Trouble relaxing 2 3 2   Restless 1 2 1   Easily annoyed or irritable 1 0 0  Afraid - awful might happen 0 0 0  Total GAD 7 Score 4 6 4   Anxiety Difficulty Somewhat difficult Somewhat difficult Not difficult at all       05/06/2023    1:06 PM 03/17/2023    2:55 PM 11/06/2022    2:10 PM 11/03/2022   10:35 AM  Depression screen PHQ 2/9  Decreased Interest 1 2 3    Down, Depressed, Hopeless 3 2 3    PHQ - 2 Score 4 4 6    Altered sleeping 1 3 0   Tired, decreased energy 2 0 1   Change in appetite 0 0 0   Feeling bad or failure about yourself  1 1 1    Trouble concentrating 2 2 3    Moving slowly or fidgety/restless 0 0 2   Suicidal thoughts 0 0 0   PHQ-9 Score 10 10 13    Difficult doing work/chores Very difficult Somewhat difficult Not difficult at all      Information is confidential and restricted. Go to Review Flowsheets to unlock data.      Relevant past medical, surgical, family, and social history reviewed and updated as indicated.  Allergies and medications reviewed and updated. Data reviewed: Chart in Epic.   Past  Medical History:  Diagnosis Date   Anxiety    Arthritis    CAD (coronary artery disease)    Depression    Fibromyalgia    GERD (gastroesophageal reflux disease)    History of gout    Kidney stones    Migraine    Morbid obesity (HCC)    Palpitations    Sarcoidosis    Tachycardia    Testicular hypofunction     Past Surgical History:  Procedure Laterality Date   COLONOSCOPY WITH PROPOFOL  N/A 07/02/2022   Procedure: COLONOSCOPY WITH PROPOFOL ;  Surgeon: Shaaron Lamar HERO, MD;  [facility];  Service: Endoscopy;  Laterality: N/A;  9:15 am   CORONARY PRESSURE/FFR STUDY N/A 07/31/2020   Procedure: INTRAVASCULAR PRESSURE WIRE/FFR STUDY;  Surgeon: Swaziland, Peter M, MD;  Location: [facility] INVASIVE CV LAB;  Service: Cardiovascular;  Laterality: N/A;   CORONARY STENT INTERVENTION N/A  07/31/2020   Procedure: CORONARY STENT INTERVENTION;  Surgeon: Swaziland, Peter M, MD;  Location: [facility] INVASIVE CV LAB;  Service: Cardiovascular;  Laterality: N/A;   ESOPHAGOGASTRODUODENOSCOPY (EGD) WITH PROPOFOL  N/A 07/02/2022   Procedure: ESOPHAGOGASTRODUODENOSCOPY (EGD) WITH PROPOFOL ;  Surgeon: Shaaron Lamar HERO, MD;  [facility];  Service: Endoscopy;  Laterality: N/A;   Fusion     c 3-5- cevical fusion   LEFT HEART CATH AND CORONARY ANGIOGRAPHY N/A 07/31/2020   Procedure: LEFT HEART CATH AND CORONARY ANGIOGRAPHY;  Surgeon: Swaziland, Peter M, MD;  Location: [facility] INVASIVE CV LAB;  Service: Cardiovascular;  Laterality: N/A;   LITHOTRIPSY     MALONEY DILATION N/A 07/02/2022   Procedure: AGAPITO DILATION;  Surgeon: Shaaron Lamar HERO, MD;  [facility];  Service: Endoscopy;  Laterality: N/A;   POLYPECTOMY  07/02/2022   Procedure: POLYPECTOMY;  Surgeon: Shaaron Lamar HERO, MD;  [facility];  Service: Endoscopy;;    Social History   Socioeconomic History   Marital status: Married    Spouse name: Not on file   Number of children: 3   Years of education: Not on file   Highest education level: Some college, no degree  Occupational History   Occupation: Retired    Comment: Previously in [employer] x 22 years  Tobacco Use   Smoking status: Former    Current packs/day: 0.00    Types: Cigarettes    Quit date: 1996    Years since quitting: 29.1   Smokeless tobacco: Current    Types: Snuff  Vaping Use   Vaping status: Never Used  Substance and Sexual Activity   Alcohol use: Yes    Comment: rare - beer   Drug use: Yes    Types: Marijuana    Comment: use daily- smoke a bowl 5 x daily   Sexual activity: Yes  Other Topics Concern   Not on file  Social History Narrative   Not on file   Social Drivers of Health   Financial Resource Strain: Low Risk  (07/09/2023)   Overall Financial Resource Strain (CARDIA)    Difficulty of Paying Living Expenses: Not hard at all  Food  Insecurity: No Food Insecurity (07/09/2023)   Hunger Vital Sign    Worried About Running Out of Food in the Last Year: Never true    Ran Out of Food in the Last Year: Never true  Transportation Needs: No Transportation Needs (07/09/2023)   PRAPARE - Administrator, Civil Service (Medical): No    Lack of Transportation (Non-Medical): No  Physical Activity: Sufficiently  Active (07/09/2023)   Exercise Vital Sign    Days of Exercise per Week: 7 days    Minutes of Exercise per Session: 120 min  Stress: Stress Concern Present (07/09/2023)   [employer] of Occupational Health - Occupational Stress Questionnaire    Feeling of Stress : Rather much  Social Connections: Socially Isolated (07/09/2023)   Social Connection and Isolation Panel [NHANES]    Frequency of Communication with Friends and Family: More than three times a week    Frequency of Social Gatherings with Friends and Family: Never    Attends Religious Services: Never    Database administrator or Organizations: No    Attends Engineer, structural: Not on file    Marital Status: Separated  Intimate Partner Violence: Not on file    Outpatient Encounter Medications as of 07/09/2023  Medication Sig   amLODipine  (NORVASC ) 5 MG tablet TAKE 1 TABLET(5 MG) BY MOUTH DAILY   aspirin  EC 81 MG tablet Take 1 tablet (81 mg total) by mouth daily. Swallow whole.   cyclobenzaprine  (FLEXERIL ) 10 MG tablet Take 10 mg by mouth 3 (three) times daily as needed for muscle spasms.   isosorbide  mononitrate (IMDUR ) 30 MG 24 hr tablet TAKE 1 TABLET(30 MG) BY MOUTH DAILY   nitroGLYCERIN  (NITROSTAT ) 0.4 MG SL tablet ONE TABLET UNDER TONGUE AS NEEDED FOR CHEST PAIN   nystatin (MYCOSTATIN) 100000 UNIT/ML suspension Take 5 mLs by mouth 4 (four) times daily as needed.   propranolol  ER (INDERAL  LA) 160 MG SR capsule TAKE 1 CAPSULE(160 MG) BY MOUTH DAILY   rosuvastatin  (CRESTOR ) 40 MG tablet TAKE 1 TABLET BY MOUTH EVERY DAY   traZODone   (DESYREL ) 50 MG tablet Take 0.5-1 tablets (25-50 mg total) by mouth at bedtime as needed for sleep.   Vitamin D , Ergocalciferol , (DRISDOL ) 1.25 MG (50000 UNIT) CAPS capsule Take 1 capsule (50,000 Units total) by mouth every 7 (seven) days.   [DISCONTINUED] pantoprazole  (PROTONIX ) 40 MG tablet Take 40 mg by mouth daily.   [DISCONTINUED] sertraline  (ZOLOFT ) 100 MG tablet TAKE 1 TABLET BY MOUTH EVERY DAY   [DISCONTINUED] Venlafaxine  HCl 225 MG TB24 Take 225 mg by mouth daily.   pantoprazole  (PROTONIX ) 40 MG tablet Take 1 tablet (40 mg total) by mouth daily.   sertraline  (ZOLOFT ) 100 MG tablet Take 1 tablet (100 mg total) by mouth daily.   Venlafaxine  HCl 225 MG TB24 Take 1 tablet (225 mg total) by mouth daily.   No facility-administered encounter medications on file as of 07/09/2023.    Allergies  Allergen Reactions   Calcium -Containing Compounds     Flares sarcoidosis    Nsaids Other (See Comments)    High blood pressure    Pertinent ROS per HPI, otherwise unremarkable      Objective:  BP 120/77   Pulse 88   Temp 98 F (36.7 C)   Ht 6' 1 (1.854 m)   Wt 226 lb 3.2 oz (102.6 kg)   SpO2 94%   BMI 29.84 kg/m    Wt Readings from Last 3 Encounters:  07/09/23 226 lb 3.2 oz (102.6 kg)  05/07/23 224 lb 12.8 oz (102 kg)  05/06/23 226 lb (102.5 kg)    Physical Exam Vitals and nursing note reviewed.  Constitutional:      General: He is not in acute distress.    Appearance: Normal appearance. He is well-developed, well-groomed and overweight. He is not ill-appearing, toxic-appearing or diaphoretic.  HENT:     Head: Normocephalic and atraumatic.  Mouth/Throat:     Mouth: Mucous membranes are moist.  Eyes:     Conjunctiva/sclera: Conjunctivae normal.     Pupils: Pupils are equal, round, and reactive to light.  Cardiovascular:     Rate and Rhythm: Normal rate and regular rhythm.     Heart sounds: Normal heart sounds.  Pulmonary:     Effort: Pulmonary effort is normal.      Breath sounds: Normal breath sounds.  Skin:    General: Skin is warm and dry.     Capillary Refill: Capillary refill takes less than 2 seconds.  Neurological:     General: No focal deficit present.     Mental Status: He is alert and oriented to person, place, and time.  Psychiatric:        Attention and Perception: Attention and perception normal.        Mood and Affect: Mood is depressed. Affect is flat.        Speech: Speech normal.        Behavior: Behavior is cooperative.        Thought Content: Thought content normal.        Cognition and Memory: Cognition and memory normal.        Judgment: Judgment normal.     Results for orders placed or performed in visit on 05/06/23  Urine Culture   Collection Time: 05/06/23  1:29 PM   Specimen: Urine   UR  Result Value Ref Range   Urine Culture, Routine Final report    Organism ID, Bacteria No growth   Urinalysis, Routine w reflex microscopic   Collection Time: 05/06/23  1:29 PM  Result Value Ref Range   Specific Gravity, UA >1.030 (H) 1.005 - 1.030   pH, UA 6.0 5.0 - 7.5   Color, UA Yellow Yellow   Appearance Ur Clear Clear   Leukocytes,UA Negative Negative   Protein,UA Negative Negative/Trace   Glucose, UA Negative Negative   Ketones, UA Negative Negative   RBC, UA Negative Negative   Bilirubin, UA Negative Negative   Urobilinogen, Ur 0.2 0.2 - 1.0 mg/dL   Nitrite, UA Negative Negative       Pertinent labs & imaging results that were available during my care of the patient were reviewed by me and considered in my medical decision making.  Assessment & Plan:  Tyon was seen today for testicle pain.  Diagnoses and all orders for this visit:  Pain in right testicle -     US  SCROTUM W/DOPPLER; Future  Gastroesophageal reflux disease without esophagitis -     pantoprazole  (PROTONIX ) 40 MG tablet; Take 1 tablet (40 mg total) by mouth daily.  Depression, recurrent (HCC) -     sertraline  (ZOLOFT ) 100 MG tablet; Take  1 tablet (100 mg total) by mouth daily. -     Venlafaxine  HCl 225 MG TB24; Take 1 tablet (225 mg total) by mouth daily. -     Ambulatory referral to Integrated Behavioral Health  PTSD (post-traumatic stress disorder) -     Ambulatory referral to Integrated Behavioral Health  Generalized anxiety disorder -     Ambulatory referral to Integrated Behavioral Health     Assessment and Plan    Right Groin Pain Chronic right groin pain radiating to the right testicle, exacerbated by erections. Previous ultrasound three years ago was unremarkable. Differential diagnosis includes testicular torsion, hydrocele, and varicocele. Discussed risks of testicular torsion, including potential loss of the testicle if untreated. Ultrasound and urology referral are  necessary for further evaluation. - Order stat ultrasound of the right groin and testicle - Refer to urologist for further evaluation  Hematuria Intermittent hematuria associated with passing kidney stones. No current stones reported. Further evaluation necessary if symptoms persist or worsen to rule out other causes. - Monitor for recurrence of hematuria - Evaluate further if symptoms persist or worsen  Depression Worsening depression over the past few weeks, exacerbated by recent family stressors. Symptoms include crying when not occupied. Discussed importance of mental health support and potential benefits of therapy. Referral to mental health specialist for evaluation and management. - Refer to mental health specialist [facility]) for evaluation and management.        Patient and/or legal guardian verbally consented to [facility] services about presenting concerns and psychiatric consultation as appropriate.  The services will be billed as appropriate for the patient.   Continue all other maintenance medications.  Follow up plan: Return if symptoms worsen or fail to improve.   Continue healthy lifestyle choices,  including diet (rich in fruits, vegetables, and lean proteins, and low in salt and simple carbohydrates) and exercise (at least 30 minutes of moderate physical activity daily).    The above assessment and management plan was discussed with the patient. The patient verbalized understanding of and has agreed to the management plan. Patient is aware to call the clinic if they develop any new symptoms or if symptoms persist or worsen. Patient is aware when to return to the clinic for a follow-up visit. Patient educated on when it is appropriate to go to the emergency department.   Rosaline Bruns, FNP-C [facility] [phone]

## 2023-07-10 ENCOUNTER — Ambulatory Visit (HOSPITAL_BASED_OUTPATIENT_CLINIC_OR_DEPARTMENT_OTHER)
Admission: RE | Admit: 2023-07-10 | Discharge: 2023-07-10 | Disposition: A | Discharge status: Home / Self Care | Source: Ambulatory Visit | Attending: Family Medicine | Admitting: Family Medicine

## 2023-07-10 DIAGNOSIS — R1031 Right lower quadrant pain: Secondary | ICD-10-CM | POA: Diagnosis present

## 2023-07-10 DIAGNOSIS — N50811 Right testicular pain: Secondary | ICD-10-CM | POA: Diagnosis present

## 2023-07-10 NOTE — Addendum Note (Signed)
Addended by: Sonny Masters on: 07/10/2023 04:16 PM   Modules accepted: Orders

## 2023-07-14 ENCOUNTER — Encounter: Payer: Self-pay | Admitting: Family Medicine

## 2023-07-29 ENCOUNTER — Ambulatory Visit: Admitting: Urology

## 2023-07-29 ENCOUNTER — Encounter: Payer: Self-pay | Admitting: Urology

## 2023-07-29 VITALS — BP 109/74 | HR 88 | Ht 72.0 in | Wt 224.0 lb

## 2023-07-29 DIAGNOSIS — N5082 Scrotal pain: Secondary | ICD-10-CM | POA: Diagnosis not present

## 2023-07-29 DIAGNOSIS — Z87442 Personal history of urinary calculi: Secondary | ICD-10-CM | POA: Diagnosis not present

## 2023-07-29 DIAGNOSIS — N200 Calculus of kidney: Secondary | ICD-10-CM

## 2023-07-29 LAB — URINALYSIS, ROUTINE W REFLEX MICROSCOPIC
Bilirubin, UA: NEGATIVE
Glucose, UA: NEGATIVE
Ketones, UA: NEGATIVE
Leukocytes,UA: NEGATIVE
Nitrite, UA: NEGATIVE
Protein,UA: NEGATIVE
RBC, UA: NEGATIVE
Specific Gravity, UA: 1.025 (ref 1.005–1.030)
Urobilinogen, Ur: 0.2 mg/dL (ref 0.2–1.0)
pH, UA: 5.5 (ref 5.0–7.5)

## 2023-07-29 NOTE — Progress Notes (Signed)
 Assessment: 1. Scrotal pain   2. Nephrolithiasis     Plan: I personally reviewed the patient's chart including provider notes, labs and imaging results. I discussed the diagnosis and management of chronic orchialgia with the patient today.  I do not think that the small calcification seen on recent CT and ultrasound would be the explanation for his chronic scrotal pain.  This calcification was present on CT imaging in 08-06-2017. I recommended a course of anti-inflammatories.  Will check with Dr. Wyline Mood to make sure that this is not contraindicated. 24-hour urine ordered for evaluation of recurrent nephrolithiasis. Return to office in 6 weeks   Chief Complaint:  Chief Complaint  Patient presents with   Nephrolithiasis    History of Present Illness:  Henry Webb is a 53 y.o. year old male who is seen for evaluation of abdominal and flank pain as well as right testicular pain. He presents today for evaluation of right sided scrotal pain which has been present for greater than 3 years.  He reports this pain is similar to the symptoms he was having in 08/06/21.  He has fairly constant pain in the right scrotal area with radiation into the right groin.  No scrotal swelling or redness.  He is not taking anything for the symptoms at this time.  He also reports continued episodes of intermittent gross hematuria.  This last occurred several weeks ago.  No recent gross hematuria.  He is not having any dysuria or flank pain at the present time.  He reports passing stones intermittently. CT abdomen and pelvis without contrast from 2/25 showed no renal calculi, no renal mass or evidence of obstruction and no ureteral calculi. Scrotal ultrasound from 2/25 showed symmetrical heterogeneous echotexture of both testicles, 6 mm shadowing echogenic focus within the right inguinal canal likely calcification.  He was previously seen in [place] for evaluation of a right renal calculus and gross hematuria.   At his initial visit in December 2022, he reported right lower quadrant pain with radiation to the right upper scrotum.  He has a long history of kidney stones with approximately 26 episodes of stones.  He has required ureteroscopic stone manipulation on several occasions as well as lithotripsy.  His last stone procedure was in Aug 06, 2009.  He has passed multiple stones since that time.  He noted onset of gross hematuria while traveling in July 2022.  He had another episode in August 2022.  Recently, he has noted fairly constant gross hematuria.  Urinalysis from 04/02/2021 showed large blood.   He also has pain in the right lower quadrant with radiation to the right scrotum and testicle.  The pain symptoms have been more noticeable within the past several days.  No flank pain or dysuria.  No scrotal swelling or erythema.  He does report urinary frequency, nocturia, and occasional incontinence. IPSS = 7.  He is on Brilinta following placement of a cardiac stent last year.  CT abdomen and pelvis with and without contrast from 04/17/2021 showed small bilateral nonobstructive renal calculi, no ureteral calculi or hydronephrosis. He presented to [facility] emergency room on 05/24/2021 with groin pain.  Urinalysis demonstrated >25 RBCs.  CT imaging showed a 3 mm calculus at the right UVJ as well as a 3 mm nonobstructing left renal calculus.  He passed the right ureteral calculus.  He continued to have pain in the right lower quadrant with radiation to the right upper scrotum.  He was seen by Dr. Henreitta Leber and general surgery.  No inguinal hernia was appreciated.  He was treated with Levaquin x10 days for possible epididymitis. He underwent evaluation with cystoscopy in January 2023.  He was found to have lateral lobe enlargement of the prostate but no other abnormalities.   Portions of the above documentation were copied from a prior visit for review purposes only.   Past Medical History:  Past Medical History:   Diagnosis Date   Anxiety    Arthritis    CAD (coronary artery disease)    Depression    Fibromyalgia    GERD (gastroesophageal reflux disease)    History of gout    Kidney stones    Migraine    Morbid obesity (HCC)    Palpitations    Sarcoidosis    Tachycardia    Testicular hypofunction     Past Surgical History:  Past Surgical History:  Procedure Laterality Date   COLONOSCOPY WITH PROPOFOL N/A 07/02/2022   Procedure: COLONOSCOPY WITH PROPOFOL;  Surgeon: Corbin Ade, MD;  [facility];  Service: Endoscopy;  Laterality: N/A;  9:15 am   CORONARY PRESSURE/FFR STUDY N/A 07/31/2020   Procedure: INTRAVASCULAR PRESSURE WIRE/FFR STUDY;  Surgeon: Swaziland, Peter M, MD;  Location: [facility] INVASIVE CV LAB;  Service: Cardiovascular;  Laterality: N/A;   CORONARY STENT INTERVENTION N/A 07/31/2020   Procedure: CORONARY STENT INTERVENTION;  Surgeon: Swaziland, Peter M, MD;  Location: [facility] INVASIVE CV LAB;  Service: Cardiovascular;  Laterality: N/A;   ESOPHAGOGASTRODUODENOSCOPY (EGD) WITH PROPOFOL N/A 07/02/2022   Procedure: ESOPHAGOGASTRODUODENOSCOPY (EGD) WITH PROPOFOL;  Surgeon: Corbin Ade, MD;  [facility];  Service: Endoscopy;  Laterality: N/A;   Fusion     c 3-5- cevical fusion   LEFT HEART CATH AND CORONARY ANGIOGRAPHY N/A 07/31/2020   Procedure: LEFT HEART CATH AND CORONARY ANGIOGRAPHY;  Surgeon: Swaziland, Peter M, MD;  Location: [facility] INVASIVE CV LAB;  Service: Cardiovascular;  Laterality: N/A;   LITHOTRIPSY     MALONEY DILATION N/A 07/02/2022   Procedure: Elease Hashimoto DILATION;  Surgeon: Corbin Ade, MD;  [facility];  Service: Endoscopy;  Laterality: N/A;   POLYPECTOMY  07/02/2022   Procedure: POLYPECTOMY;  Surgeon: Corbin Ade, MD;  [facility];  Service: Endoscopy;;    Allergies:  Allergies  Allergen Reactions   Calcium-Containing Compounds     Flares sarcoidosis    Nsaids Other (See Comments)    High blood pressure    Family History:   Family History  Problem Relation Age of Onset   Depression Mother    Ovarian cancer Mother    Rheum arthritis Mother    Depression Father    Heart attack Father    Hypertension Father    Ehlers-Danlos syndrome Sister    Depression Daughter    Headache Daughter    Colon cancer Cousin 32    Social History:  Social History   Tobacco Use   Smoking status: Former    Current packs/day: 0.00    Types: Cigarettes    Quit date: 1996    Years since quitting: 29.1   Smokeless tobacco: Current    Types: Snuff  Vaping Use   Vaping status: Never Used  Substance Use Topics   Alcohol use: Yes    Comment: rare - beer   Drug use: Yes    Types: Marijuana    Comment: use daily- smoke a bowl 5 x daily    ROS: Constitutional:  Negative for fever, chills, weight loss CV: Negative for chest  pain, previous MI, hypertension Respiratory:  Negative for shortness of breath, wheezing, sleep apnea, frequent cough GI:  Negative for nausea, vomiting, bloody stool, GERD  Physical exam: BP 109/74   Pulse 88   Ht 6' (1.829 m)   Wt 224 lb (101.6 kg)   BMI 30.38 kg/m  GENERAL APPEARANCE:  Well appearing, well developed, well nourished, NAD HEENT:  Atraumatic, normocephalic, oropharynx clear NECK:  Supple without lymphadenopathy or thyromegaly ABDOMEN:  Soft, non-tender, no masses EXTREMITIES:  Moves all extremities well, without clubbing, cyanosis, or edema NEUROLOGIC:  Alert and oriented x 3, normal gait, CN II-XII grossly intact MENTAL STATUS:  appropriate BACK:  Non-tender to palpation, No CVAT SKIN:  Warm, dry, and intact GU: Penis:  circumcised Meatus: Normal Scrotum: No edema or edema Testis: normal without masses bilateral Epididymis: Right epididymis tender to palpation   Results: U/A:  negative

## 2023-08-10 ENCOUNTER — Encounter: Payer: Self-pay | Admitting: Urology

## 2023-08-14 ENCOUNTER — Other Ambulatory Visit: Payer: Self-pay | Admitting: Urology

## 2023-08-14 DIAGNOSIS — N5082 Scrotal pain: Secondary | ICD-10-CM

## 2023-08-14 MED ORDER — MELOXICAM 7.5 MG PO TABS: 7.5000 mg | ORAL_TABLET | Freq: Every day | ORAL | 0 refills | Status: AC

## 2023-08-17 ENCOUNTER — Encounter: Payer: Self-pay | Admitting: *Deleted

## 2023-08-20 ENCOUNTER — Ambulatory Visit: Attending: Cardiology | Admitting: Cardiology

## 2023-08-20 ENCOUNTER — Encounter: Payer: Self-pay | Admitting: Cardiology

## 2023-08-20 VITALS — BP 124/70 | HR 90 | Ht 72.0 in | Wt 229.0 lb

## 2023-08-20 DIAGNOSIS — E782 Mixed hyperlipidemia: Secondary | ICD-10-CM | POA: Diagnosis not present

## 2023-08-20 DIAGNOSIS — I2510 Atherosclerotic heart disease of native coronary artery without angina pectoris: Secondary | ICD-10-CM

## 2023-08-20 DIAGNOSIS — R002 Palpitations: Secondary | ICD-10-CM

## 2023-08-20 DIAGNOSIS — I493 Ventricular premature depolarization: Secondary | ICD-10-CM

## 2023-08-20 NOTE — Patient Instructions (Addendum)
 Medication Instructions:  Your physician recommends that you continue on your current medications as directed. Please refer to the Current Medication list given to you today.  Labwork: In about a Month at [facility]   Testing/Procedures: None   Follow-Up: Your physician recommends that you schedule a follow-up appointment in: 6 months   Any Other Special Instructions Will Be Listed Below (If Applicable).  If you need a refill on your cardiac medications before your next appointment, please call your pharmacy.

## 2023-08-20 NOTE — Addendum Note (Signed)
 Addended by: Sharen Hones on: 08/20/2023 03:58 PM   Modules accepted: Orders

## 2023-08-20 NOTE — Progress Notes (Signed)
 Clinical Summary Mr. Yano is a 53 y.o.male seen today for follow up of the following medical problems.     1. Palpitations - symptoms on and off for several years - 04/2019 episodes 1% PVC burden. 02/2019 [facility] PET scan: no evidence of cardiac sarcoid   - 12/2021 monitor: rare ectopy -denies any palpitations      2. Chest pain/CAD - sharp pain midchest, come activity. Mild pain, resolves with rest. No other associated symptoms - symptoms started 3-4 months ago. No change in pattern. CAD: HL, former 8 years tobacco, father CAD in 81s, paternal grandather heart diease later 19s,   - 02/2019 PET stress at [facility], some coronary calcifications, no ischemia. Of note was not having chest pains at that time, test ordered to evaluate for cardiac sarcoid which was negative.      04/2020 GXT: no ST/T changes, [facility] treadmill score 2.30 May 2020 nuclear stress: inferolateral infarct with moderate ischemia   07/2020 cath: LM patent, LAD distal 75%, D1 99%, D2 70%, ramus 99%, LCX mid 99%, OM1 70%, RCA 25%, RPDA 90%. Received DES to LCX and OM. Other disease small vessel or diffuse distal disease    - no recent chest pains. No SOB/DOE - compliant with meds   3. Sarcoid - involvement of eye, lungs, liver, kidney, lymph nodes, and neuro - followed by rheumatology     4. White coat HTN - compliant with meds   - home bp's 120s/80s     5. Syncope - isolated episode, thought to be vasovagal - no recurrent episodes    6. Hematuria/recurrent kidney stones - followed by urology       7. Hyperlipidemia  - recent labs with pcp 04/2021 : 04/2021 TC 159 TG 133 HDL 38 LDL 97    Jan 2024: TC 135 TG 85 HDL 42 LDL 77 - discussed zetia, wants to work on diet.   02/2023 TC 156 TG 197 HDL 37 LDL 85 - has made dietary changes since last labs.    Just had new grandson by his daughter who lives in [place], just turned 6 months.  Past Medical History:  Diagnosis Date   Anxiety     Arthritis    CAD (coronary artery disease)    Depression    Fibromyalgia    GERD (gastroesophageal reflux disease)    History of gout    Kidney stones    Migraine    Morbid obesity (HCC)    Palpitations    Sarcoidosis    Tachycardia    Testicular hypofunction      Allergies  Allergen Reactions   Calcium-Containing Compounds     Flares sarcoidosis    Nsaids Other (See Comments)    High blood pressure     Current Outpatient Medications  Medication Sig Dispense Refill   amLODipine (NORVASC) 5 MG tablet TAKE 1 TABLET(5 MG) BY MOUTH DAILY 30 tablet 6   aspirin EC 81 MG tablet Take 1 tablet (81 mg total) by mouth daily. Swallow whole. 90 tablet 3   cyclobenzaprine (FLEXERIL) 10 MG tablet Take 10 mg by mouth 3 (three) times daily as needed for muscle spasms.     isosorbide mononitrate (IMDUR) 30 MG 24 hr tablet TAKE 1 TABLET(30 MG) BY MOUTH DAILY 90 tablet 3   meloxicam (MOBIC) 7.5 MG tablet Take 1 tablet (7.5 mg total) by mouth daily for 14 days. 14 tablet 0   nitroGLYCERIN (NITROSTAT) 0.4 MG SL tablet ONE TABLET  UNDER TONGUE AS NEEDED FOR CHEST PAIN 25 tablet 3   nystatin (MYCOSTATIN) 100000 UNIT/ML suspension Take 5 mLs by mouth 4 (four) times daily as needed.     pantoprazole (PROTONIX) 40 MG tablet Take 1 tablet (40 mg total) by mouth daily. 90 tablet 1   propranolol ER (INDERAL LA) 160 MG SR capsule TAKE 1 CAPSULE(160 MG) BY MOUTH DAILY 90 capsule 2   rosuvastatin (CRESTOR) 40 MG tablet TAKE 1 TABLET BY MOUTH EVERY DAY 90 tablet 3   sertraline (ZOLOFT) 100 MG tablet Take 1 tablet (100 mg total) by mouth daily. 90 tablet 1   traZODone (DESYREL) 50 MG tablet Take 0.5-1 tablets (25-50 mg total) by mouth at bedtime as needed for sleep. 30 tablet 6   Venlafaxine HCl 225 MG TB24 Take 1 tablet (225 mg total) by mouth daily. 90 tablet 1   Vitamin D, Ergocalciferol, (DRISDOL) 1.25 MG (50000 UNIT) CAPS capsule Take 1 capsule (50,000 Units total) by mouth every 7 (seven) days. 5 capsule 3    No current facility-administered medications for this visit.     Past Surgical History:  Procedure Laterality Date   COLONOSCOPY WITH PROPOFOL N/A 07/02/2022   Procedure: COLONOSCOPY WITH PROPOFOL;  Surgeon: Corbin Ade, MD;  [facility];  Service: Endoscopy;  Laterality: N/A;  9:15 am   CORONARY PRESSURE/FFR STUDY N/A 07/31/2020   Procedure: INTRAVASCULAR PRESSURE WIRE/FFR STUDY;  Surgeon: Swaziland, Peter M, MD;  Location: [facility] INVASIVE CV LAB;  Service: Cardiovascular;  Laterality: N/A;   CORONARY STENT INTERVENTION N/A 07/31/2020   Procedure: CORONARY STENT INTERVENTION;  Surgeon: Swaziland, Peter M, MD;  Location: [facility] INVASIVE CV LAB;  Service: Cardiovascular;  Laterality: N/A;   ESOPHAGOGASTRODUODENOSCOPY (EGD) WITH PROPOFOL N/A 07/02/2022   Procedure: ESOPHAGOGASTRODUODENOSCOPY (EGD) WITH PROPOFOL;  Surgeon: Corbin Ade, MD;  [facility];  Service: Endoscopy;  Laterality: N/A;   Fusion     c 3-5- cevical fusion   LEFT HEART CATH AND CORONARY ANGIOGRAPHY N/A 07/31/2020   Procedure: LEFT HEART CATH AND CORONARY ANGIOGRAPHY;  Surgeon: Swaziland, Peter M, MD;  Location: [facility] INVASIVE CV LAB;  Service: Cardiovascular;  Laterality: N/A;   LITHOTRIPSY     MALONEY DILATION N/A 07/02/2022   Procedure: Elease Hashimoto DILATION;  Surgeon: Corbin Ade, MD;  [facility];  Service: Endoscopy;  Laterality: N/A;   POLYPECTOMY  07/02/2022   Procedure: POLYPECTOMY;  Surgeon: Corbin Ade, MD;  [facility];  Service: Endoscopy;;     Allergies  Allergen Reactions   Calcium-Containing Compounds     Flares sarcoidosis    Nsaids Other (See Comments)    High blood pressure      Family History  Problem Relation Age of Onset   Depression Mother    Ovarian cancer Mother    Rheum arthritis Mother    Depression Father    Heart attack Father    Hypertension Father    Ehlers-Danlos syndrome Sister    Depression Daughter    Headache Daughter    Colon cancer  Cousin 98     Social History Mr. Clodfelter reports that he quit smoking about 29 years ago. His smoking use included cigarettes. His smokeless tobacco use includes snuff. Mr. Benally reports current alcohol use.     Physical Examination Today's Vitals   08/20/23 1515  BP: 124/70  Pulse: 90  SpO2: 95%  Weight: 229 lb (103.9 kg)  Height: 6' (1.829 m)   Body mass index is  31.06 kg/m.  Gen: resting comfortably, no acute distress HEENT: no scleral icterus, pupils equal round and reactive, no palptable cervical adenopathy,  CV: RRR, no mrg, no jvd Resp: Clear to auscultation bilaterally GI: abdomen is soft, non-tender, non-distended, normal bowel sounds, no hepatosplenomegaly MSK: extremities are warm, no edema.  Skin: warm, no rash Neuro:  no focal deficits Psych: appropriate affect   Diagnostic Studies  04/2019 event monitor 72 hour monitor Min HR 76, Max HR 147, Avg HR 105 Rare supraventricular ectopy in the form of isolated PACs Occasional ventricular ectopy. 1% PVC burden. In the form of isolated PVCs, couplets, bigeminy, trigeminy. No NSVT or VT.     02/2019 Cardiac PET [facility] Impressions: - No evidence of cardiac sarcoidosis - Perfusion: Normal perfusion in the left ventricular myocardium on resting images. Metabolism: No abnormal FDG uptake noted.  - Global systolic function is mildly reduced. - Coronary calcifications are noted   Jan 2022 nuclear stress There was no ST segment deviation noted during stress. 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 This is an intermediate risk study. The left ventricular ejection fraction is normal (55-65%).       Assessment and Plan   1. PVCs/Palpitations - denies any symptoms, continue current meds       2. CAD - Has some residual small vessel/diffuse distal disease managed medically, room to titrate antianginals in the future if needed - no symptoms,  continue current meds   3. Hyperlipidemia - since last lipid panel has made dietary changes, will repeat lipid panel. Goal LDL would be <70, consider adding zetia 10mg  to his crestor 40mg  daily if still not at goal   F/u 6 months     Antoine Poche, M.D.

## 2023-09-05 ENCOUNTER — Other Ambulatory Visit: Payer: Self-pay | Admitting: Urology

## 2023-09-08 ENCOUNTER — Ambulatory Visit: Admitting: Urology

## 2023-09-08 ENCOUNTER — Encounter: Payer: Self-pay | Admitting: Urology

## 2023-09-08 ENCOUNTER — Telehealth: Payer: Self-pay | Admitting: Family Medicine

## 2023-09-08 VITALS — BP 110/74 | HR 91 | Ht 72.0 in | Wt 225.0 lb

## 2023-09-08 DIAGNOSIS — Z87442 Personal history of urinary calculi: Secondary | ICD-10-CM

## 2023-09-08 DIAGNOSIS — N5082 Scrotal pain: Secondary | ICD-10-CM

## 2023-09-08 DIAGNOSIS — N200 Calculus of kidney: Secondary | ICD-10-CM

## 2023-09-08 LAB — URINALYSIS, ROUTINE W REFLEX MICROSCOPIC
Bilirubin, UA: NEGATIVE
Glucose, UA: NEGATIVE
Ketones, UA: NEGATIVE
Leukocytes,UA: NEGATIVE
Nitrite, UA: NEGATIVE
Protein,UA: NEGATIVE
Specific Gravity, UA: 1.025 (ref 1.005–1.030)
Urobilinogen, Ur: 0.2 mg/dL (ref 0.2–1.0)
pH, UA: 5.5 (ref 5.0–7.5)

## 2023-09-08 LAB — MICROSCOPIC EXAMINATION

## 2023-09-08 NOTE — Progress Notes (Signed)
 Assessment: 1. Scrotal pain   2. Nephrolithiasis     Plan: I discussed the diagnosis and management of chronic orchialgia with the patient today.  Unfortunately, he continues to have symptoms despite a course of anti-inflammatories.  I am hesitant to use any antidepressants off label due to his current medical therapy. Recommend referral to Dr.Machen with [facility] for evaluation and management of chronic orchalgia. Await results of 24-hour urine for evaluation of recurrent nephrolithiasis.   Chief Complaint:  Chief Complaint  Patient presents with   Testicle Pain    History of Present Illness:  Henry Webb is a 53 y.o. year old male who is seen for continued evaluation of right testicular pain and nephrolithiasis. He was seen in March 2025 for evaluation of right sided scrotal pain which had been present for greater than 3 years.  He reported this pain is similar to the symptoms he was having in 2021-09-26.  He reported fairly constant pain in the right scrotal area with radiation into the right groin.  No scrotal swelling or redness.  He was not taking anything for the symptoms at this time.  He also reported continued episodes of intermittent gross hematuria.  This last occurred several weeks prior to his visit.  No recent gross hematuria.  No dysuria or flank pain at the time of his visit.  He reported passing stones intermittently.  CT abdomen and pelvis without contrast from 2/25 showed no renal calculi, no renal mass or evidence of obstruction and no ureteral calculi. Scrotal ultrasound from 2/25 showed symmetrical heterogeneous echotexture of both testicles, 6 mm shadowing echogenic focus within the right inguinal canal likely calcification.  He was previously seen in [place] for evaluation of a right renal calculus and gross hematuria.  At his initial visit in December 2022, he reported right lower quadrant pain with radiation to the right upper scrotum.  He has a  long history of kidney stones with approximately 26 episodes of stones.  He has required ureteroscopic stone manipulation on several occasions as well as lithotripsy.  His last stone procedure was in 2009-09-26.  He has passed multiple stones since that time.  He noted onset of gross hematuria while traveling in July 2022.  He had another episode in August 2022.  Recently, he has noted fairly constant gross hematuria.  Urinalysis from 04/02/2021 showed large blood.   He also has pain in the right lower quadrant with radiation to the right scrotum and testicle.  The pain symptoms have been more noticeable within the past several days.  No flank pain or dysuria.  No scrotal swelling or erythema.  He does report urinary frequency, nocturia, and occasional incontinence. IPSS = 7.  He is on Brilinta following placement of a cardiac stent last year.  CT abdomen and pelvis with and without contrast from 04/17/2021 showed small bilateral nonobstructive renal calculi, no ureteral calculi or hydronephrosis. He presented to [facility] emergency room on 05/24/2021 with groin pain.  Urinalysis demonstrated >25 RBCs.  CT imaging showed a 3 mm calculus at the right UVJ as well as a 3 mm nonobstructing left renal calculus.  He passed the right ureteral calculus.  He continued to have pain in the right lower quadrant with radiation to the right upper scrotum.  He was seen by Dr. Henreitta Leber and general surgery.  No inguinal hernia was appreciated.  He was treated with Levaquin x10 days for possible epididymitis. He underwent evaluation with cystoscopy in January 2023.  He was  found to have lateral lobe enlargement of the prostate but no other abnormalities.  He returns today for follow-up.  He did notice some improvement in his scrotal pain with the meloxicam.  Unfortunately, he had GI upset with the medication.  Also, his cardiologist Dr. Amanda Jungling did not recommend an extended course of nonsteroidals.  Since stopping the medication  he has had recurrence of his right scrotal pain.  His lower urinary tract symptoms are stable.  He does have some urgency, frequency, and nocturia.  No dysuria or gross hematuria. IPSS = 7/3.  Portions of the above documentation were copied from a prior visit for review purposes only.   Past Medical History:  Past Medical History:  Diagnosis Date   Anxiety    Arthritis    CAD (coronary artery disease)    Depression    Fibromyalgia    GERD (gastroesophageal reflux disease)    History of gout    Kidney stones    Migraine    Morbid obesity (HCC)    Palpitations    Sarcoidosis    Tachycardia    Testicular hypofunction     Past Surgical History:  Past Surgical History:  Procedure Laterality Date   COLONOSCOPY WITH PROPOFOL N/A 07/02/2022   Procedure: COLONOSCOPY WITH PROPOFOL;  Surgeon: Suzette Espy, MD;  [facility];  Service: Endoscopy;  Laterality: N/A;  9:15 am   CORONARY PRESSURE/FFR STUDY N/A 07/31/2020   Procedure: INTRAVASCULAR PRESSURE WIRE/FFR STUDY;  Surgeon: Swaziland, Peter M, MD;  Location: [facility] INVASIVE CV LAB;  Service: Cardiovascular;  Laterality: N/A;   CORONARY STENT INTERVENTION N/A 07/31/2020   Procedure: CORONARY STENT INTERVENTION;  Surgeon: Swaziland, Peter M, MD;  Location: [facility] INVASIVE CV LAB;  Service: Cardiovascular;  Laterality: N/A;   ESOPHAGOGASTRODUODENOSCOPY (EGD) WITH PROPOFOL N/A 07/02/2022   Procedure: ESOPHAGOGASTRODUODENOSCOPY (EGD) WITH PROPOFOL;  Surgeon: Suzette Espy, MD;  [facility];  Service: Endoscopy;  Laterality: N/A;   Fusion     c 3-5- cevical fusion   LEFT HEART CATH AND CORONARY ANGIOGRAPHY N/A 07/31/2020   Procedure: LEFT HEART CATH AND CORONARY ANGIOGRAPHY;  Surgeon: Swaziland, Peter M, MD;  Location: [facility] INVASIVE CV LAB;  Service: Cardiovascular;  Laterality: N/A;   LITHOTRIPSY     MALONEY DILATION N/A 07/02/2022   Procedure: Londa Rival DILATION;  Surgeon: Suzette Espy, MD;  [facility];  Service:  Endoscopy;  Laterality: N/A;   POLYPECTOMY  07/02/2022   Procedure: POLYPECTOMY;  Surgeon: Suzette Espy, MD;  [facility];  Service: Endoscopy;;    Allergies:  Allergies  Allergen Reactions   Calcium-Containing Compounds     Flares sarcoidosis    Nsaids Other (See Comments)    High blood pressure    Family History:  Family History  Problem Relation Age of Onset   Depression Mother    Ovarian cancer Mother    Rheum arthritis Mother    Depression Father    Heart attack Father    Hypertension Father    Ehlers-Danlos syndrome Sister    Depression Daughter    Headache Daughter    Colon cancer Cousin 69    Social History:  Social History   Tobacco Use   Smoking status: Former    Current packs/day: 0.00    Types: Cigarettes    Quit date: 1996    Years since quitting: 29.3   Smokeless tobacco: Current    Types: Snuff  Vaping Use   Vaping status: Never Used  Substance Use Topics   Alcohol use: Yes    Comment: rare - beer   Drug use: Yes    Types: Marijuana    Comment: use daily- smoke a bowl 5 x daily    ROS: Constitutional:  Negative for fever, chills, weight loss CV: Negative for chest pain, previous MI, hypertension Respiratory:  Negative for shortness of breath, wheezing, sleep apnea, frequent cough GI:  Negative for nausea, vomiting, bloody stool, GERD  Physical exam: BP 110/74   Pulse 91   Ht 6' (1.829 m)   Wt 225 lb (102.1 kg)   BMI 30.52 kg/m  GENERAL APPEARANCE:  Well appearing, well developed, well nourished, NAD HEENT:  Atraumatic, normocephalic, oropharynx clear NECK:  Supple without lymphadenopathy or thyromegaly ABDOMEN:  Soft, non-tender, no masses EXTREMITIES:  Moves all extremities well, without clubbing, cyanosis, or edema NEUROLOGIC:  Alert and oriented x 3, normal gait, CN II-XII grossly intact MENTAL STATUS:  appropriate BACK:  Non-tender to palpation, No CVAT SKIN:  Warm, dry, and intact   Results: U/A: 0-5 WBCs, 0-2  RBCs

## 2023-09-19 LAB — LITHOLINK 24HR URINE PANEL

## 2023-10-08 ENCOUNTER — Other Ambulatory Visit: Payer: Self-pay | Admitting: Urology

## 2023-10-09 ENCOUNTER — Other Ambulatory Visit: Payer: Self-pay | Admitting: Cardiology

## 2023-10-15 LAB — LITHOLINK 24HR URINE PANEL
Ammonium, Urine: 35 mmol/(24.h) (ref 15–60)
Calcium Oxalate Saturation: 7.31 (ref 6.00–10.00)
Calcium Phosphate Saturation: 1.84 (ref 0.50–2.00)
Calcium, Urine: 302 mg/(24.h) — ABNORMAL HIGH (ref <200)
Calcium/Creatinine Ratio: 157 mg/g{creat} (ref 51–262)
Calcium/Kg Body Weight: 3 mg/kg/d (ref <4.0)
Chloride, Urine: 281 mmol/(24.h) — ABNORMAL HIGH (ref 70–250)
Citrate, Urine: 543 mg/(24.h) — ABNORMAL LOW (ref >550)
Creatinine, Urine: 1928 mg/(24.h)
Creatinine/Kg Body Weight: 18.9 mg/kg/d (ref 8.7–20.3)
Cystine, Urine, Qualitative: NEGATIVE
Magnesium, Urine: 176 mg/(24.h) — ABNORMAL HIGH (ref 30–120)
Oxalate, Urine: 53 mg/(24.h) — ABNORMAL HIGH (ref 20–40)
Phosphorus, Urine: 1298 mg/(24.h) — ABNORMAL HIGH (ref 600–1200)
Potassium, Urine: 65 mmol/(24.h) (ref 20–100)
Protein Catabolic Rate: 1.1 g/kg/d (ref 0.8–1.4)
Sodium, Urine: 272 mmol/(24.h) — ABNORMAL HIGH (ref 50–150)
Sulfate, Urine: 41 meq/(24.h) (ref 20–80)
Urea Nitrogen, Urine: 15.46 g/(24.h) — ABNORMAL HIGH (ref 6.00–14.00)
Uric Acid Saturation: 0.35 (ref <1.00)
Uric Acid, Urine: 846 mg/(24.h) — ABNORMAL HIGH (ref <750)
Urine Volume (Preserved): 2370 mL/(24.h) (ref 500–4000)
pH, 24 hr, Urine: 6.349 — ABNORMAL HIGH (ref 5.800–6.200)

## 2023-10-21 ENCOUNTER — Ambulatory Visit: Payer: Self-pay | Admitting: Urology

## 2023-10-27 ENCOUNTER — Other Ambulatory Visit: Payer: Self-pay | Admitting: Cardiology

## 2023-11-22 ENCOUNTER — Other Ambulatory Visit: Payer: Self-pay | Admitting: Cardiology

## 2023-11-25 ENCOUNTER — Telehealth: Payer: Self-pay

## 2023-11-25 ENCOUNTER — Telehealth: Admitting: Family Medicine

## 2023-11-25 NOTE — Telephone Encounter (Signed)
 Patient scheduled for video visit today with DOD

## 2023-11-25 NOTE — Telephone Encounter (Signed)
 Copied from CRM [id number]. Topic: Clinical - Medication Question >> Nov 25, 2023  9:39 AM Vena H wrote: Reason for CRM: Pt is wanting to know if he can have cyclobenzaprine  (FLEXERIL ) 10 MG tablet caled in again for his muscle spasms since he can't be seen until Tuesday

## 2023-11-26 ENCOUNTER — Emergency Department (HOSPITAL_COMMUNITY)

## 2023-11-26 ENCOUNTER — Emergency Department (HOSPITAL_BASED_OUTPATIENT_CLINIC_OR_DEPARTMENT_OTHER)
Admission: EM | Admit: 2023-11-26 | Discharge: 2023-11-26 | Disposition: A | Discharge status: Home / Self Care | Source: Ambulatory Visit | Attending: Emergency Medicine | Admitting: Emergency Medicine

## 2023-11-26 ENCOUNTER — Emergency Department (HOSPITAL_BASED_OUTPATIENT_CLINIC_OR_DEPARTMENT_OTHER)

## 2023-11-26 ENCOUNTER — Other Ambulatory Visit: Payer: Self-pay

## 2023-11-26 ENCOUNTER — Encounter (HOSPITAL_BASED_OUTPATIENT_CLINIC_OR_DEPARTMENT_OTHER): Payer: Self-pay | Admitting: Emergency Medicine

## 2023-11-26 DIAGNOSIS — X501XXA Overexertion from prolonged static or awkward postures, initial encounter: Secondary | ICD-10-CM | POA: Insufficient documentation

## 2023-11-26 DIAGNOSIS — F17210 Nicotine dependence, cigarettes, uncomplicated: Secondary | ICD-10-CM | POA: Diagnosis not present

## 2023-11-26 DIAGNOSIS — M549 Dorsalgia, unspecified: Secondary | ICD-10-CM | POA: Diagnosis not present

## 2023-11-26 DIAGNOSIS — R531 Weakness: Secondary | ICD-10-CM | POA: Diagnosis present

## 2023-11-26 DIAGNOSIS — I2510 Atherosclerotic heart disease of native coronary artery without angina pectoris: Secondary | ICD-10-CM | POA: Insufficient documentation

## 2023-11-26 DIAGNOSIS — I10 Essential (primary) hypertension: Secondary | ICD-10-CM | POA: Diagnosis not present

## 2023-11-26 DIAGNOSIS — Z79899 Other long term (current) drug therapy: Secondary | ICD-10-CM | POA: Diagnosis not present

## 2023-11-26 DIAGNOSIS — Z7982 Long term (current) use of aspirin: Secondary | ICD-10-CM | POA: Diagnosis not present

## 2023-11-26 DIAGNOSIS — M79602 Pain in left arm: Secondary | ICD-10-CM

## 2023-11-26 LAB — BASIC METABOLIC PANEL WITH GFR
Anion gap: 11 (ref 5–15)
BUN: 15 mg/dL (ref 6–20)
CO2: 24 mmol/L (ref 22–32)
Calcium: 9.3 mg/dL (ref 8.9–10.3)
Chloride: 105 mmol/L (ref 98–111)
Creatinine, Ser: 0.78 mg/dL (ref 0.61–1.24)
GFR, Estimated: >60 mL/min (ref >60)
Glucose, Bld: 112 mg/dL — ABNORMAL HIGH (ref 70–99)
Potassium: 3.9 mmol/L (ref 3.5–5.1)
Sodium: 140 mmol/L (ref 135–145)

## 2023-11-26 LAB — CBC
HCT: 40.5 % (ref 39.0–52.0)
Hemoglobin: 13.7 g/dL (ref 13.0–17.0)
MCH: 29.2 pg (ref 26.0–34.0)
MCHC: 33.8 g/dL (ref 30.0–36.0)
MCV: 86.4 fL (ref 80.0–100.0)
Platelets: 337 10*3/uL (ref 150–400)
RBC: 4.69 MIL/uL (ref 4.22–5.81)
RDW: 12.7 % (ref 11.5–15.5)
WBC: 8.1 10*3/uL (ref 4.0–10.5)
nRBC: 0 % (ref 0.0–0.2)

## 2023-11-26 MED ORDER — IOHEXOL 350 MG/ML SOLN
75.0000 mL | Freq: Once | INTRAVENOUS | Status: AC | PRN
  Administered 2023-11-26: 75 mL via INTRAVENOUS

## 2023-11-26 MED ORDER — LORAZEPAM 2 MG/ML IJ SOLN
2.0000 mg | Freq: Once | INTRAMUSCULAR | Status: AC
Start: 2023-11-26 — End: 2023-11-26
  Administered 2023-11-26: 2 mg via INTRAVENOUS
  Filled 2023-11-26: qty 1

## 2023-11-26 MED ORDER — METHYLPREDNISOLONE SODIUM SUCC 125 MG IJ SOLR
125.0000 mg | Freq: Once | INTRAMUSCULAR | Status: AC
  Administered 2023-11-26: 125 mg via INTRAVENOUS
  Filled 2023-11-26: qty 2

## 2023-11-26 MED ORDER — GADOBUTROL 1 MMOL/ML IV SOLN
10.0000 mL | Freq: Once | INTRAVENOUS | Status: AC | PRN
  Administered 2023-11-26: 10 mL via INTRAVENOUS

## 2023-11-26 MED ORDER — LIDOCAINE 5 % EX PTCH
2.0000 | MEDICATED_PATCH | CUTANEOUS | Status: DC
  Administered 2023-11-26: 2 via TRANSDERMAL
  Filled 2023-11-26: qty 2

## 2023-11-26 MED ORDER — CYCLOBENZAPRINE HCL 10 MG PO TABS
10.0000 mg | ORAL_TABLET | Freq: Once | ORAL | Status: AC
  Administered 2023-11-26: 10 mg via ORAL
  Filled 2023-11-26: qty 1

## 2023-11-26 NOTE — ED Provider Notes (Signed)
 Patient transferred here from: ED at [facility] for MRI brain and cervical spine with without contrast given concern for left upper back pain with left hand weakness that started on Sunday.  This has been discussed with Dr. Matthews and neurology.  Concern for stroke versus arterial dissection versus neuro sarcoidosis.  MRI taken of the brain and cervical spine without contrast here showed no acute findings.  Patient reports symptoms have resolved here he is resting comfortably at this time.  He will follow-up with his primary care doctor next week.  Appropriate for discharge at this time   Pamella Ozell LABOR, DO 11/26/23 1624

## 2023-11-26 NOTE — Telephone Encounter (Signed)
 I called and spoke with pt. He is going to go to the ED due to 10/10 pain. I told him he needed to be evaluated in person as this was sudden onset yesterday and is worsening. He agreed to plan.  Message sent to referrals coordinator to check on status of [facility] referral.

## 2023-11-26 NOTE — ED Triage Notes (Signed)
 Pt caox4, ambulatory c/o pain in L upper back reporting PMH cervical fusion stating it has been hurting just under that area since Sunday when he turned his head and felt a pop. Pt also c/o weakness in L hand since then as well.

## 2023-11-26 NOTE — ED Notes (Signed)
 Patient transported to MRI, pre meds given.  Wife waiting in ED lobby

## 2023-11-26 NOTE — ED Notes (Signed)
 IV secured with co band

## 2023-11-26 NOTE — Discharge Instructions (Addendum)
 You were seen in the emergency department for back pain and left arm pain.  The MRI of your brain and cervical spine did not show any acute findings that would explain your symptoms.  Your symptoms improved while you were here.  It is important that you follow-up with your primary care doctor in 1 week for reevaluation Return to the emergency department for severe pain if you are unable to use your arm or have any other concerns

## 2023-11-26 NOTE — ED Provider Notes (Signed)
 [place] EMERGENCY DEPARTMENT AT [facility] Provider Note   CSN: [id number] Arrival date & time: 11/26/23  1053     Patient presents with: No chief complaint on file.   Henry Webb is a 53 y.o. male.   HPI   53 year old male presents to the ED with complaints of back pain, left hand weakness.  States that on Sunday he was sitting on his tailgate reached back with his left arm to turn his head to the left and felt a popping sensation in his left upper back.  Since then has had pain and spasm type sensation in his left back as well as difficulty grasping objects with his left hand as well as weakness in his left hand.  States that the weakness in his hand has improved and there is put ice on his back but tends to come back whenever area is not being iced.  Has been Tylenol  for the pain but not necessarily the hand weakness.  Denies any visual disturbance, gait abnormality, slurred speech, facial droop, weakness/sensory deficits otherwise in upper extremities.  States he does have a history of cervical fusion that was done in [place]  around 10 years ago.  Denies any current neck pain, headache.  PMHx significant for CAD, fibromyalgia, sarcoidosis, testicular hypofunction, gerd, anxiety, arthritis, nephrolithiasis, obesity, htn, gad, cannabis dependence  Prior to Admission medications   Medication Sig Start Date End Date Taking? Authorizing Provider  amLODipine  (NORVASC ) 5 MG tablet TAKE 1 TABLET(5 MG) BY MOUTH DAILY 10/09/23   Alvan Dorn FALCON, MD  aspirin  EC 81 MG tablet Take 1 tablet (81 mg total) by mouth daily. Swallow whole. 06/27/20   Lelon Hamilton T, PA-C  cyclobenzaprine  (FLEXERIL ) 10 MG tablet Take 10 mg by mouth 3 (three) times daily as needed for muscle spasms.    [provider]  isosorbide  mononitrate (IMDUR ) 30 MG 24 hr tablet TAKE 1 TABLET(30 MG) BY MOUTH DAILY 05/28/23   Alvan Dorn FALCON, MD  nitroGLYCERIN  (NITROSTAT ) 0.4 MG SL tablet PLACE 1  TABLET UNDER THE TONGUE AS NEEDED FOR CHEST PAIN. MAY REPEAT IN 5 MINUTES IF SYMPTOMS STILL PRESENT. MAX 3 DOSES IN 15 MINUTES. 10/27/23   Branch, Dorn FALCON, MD  nystatin (MYCOSTATIN) 100000 UNIT/ML suspension Take 5 mLs by mouth 4 (four) times daily as needed. 09/09/22   [provider]  pantoprazole  (PROTONIX ) 40 MG tablet Take 1 tablet (40 mg total) by mouth daily. 07/09/23   Severa Rock HERO, FNP  propranolol  ER (INDERAL  LA) 160 MG SR capsule TAKE 1 CAPSULE(160 MG) BY MOUTH DAILY 11/23/23   Alvan Dorn FALCON, MD  rosuvastatin  (CRESTOR ) 40 MG tablet TAKE 1 TABLET BY MOUTH EVERY DAY 03/09/23   Alvan Dorn FALCON, MD  sertraline  (ZOLOFT ) 100 MG tablet Take 1 tablet (100 mg total) by mouth daily. 07/09/23   Severa Rock HERO, FNP  traZODone  (DESYREL ) 50 MG tablet Take 0.5-1 tablets (25-50 mg total) by mouth at bedtime as needed for sleep. 03/17/23   Severa Rock HERO, FNP  Venlafaxine  HCl 225 MG TB24 Take 1 tablet (225 mg total) by mouth daily. 07/09/23   Severa Rock HERO, FNP  Vitamin D , Ergocalciferol , (DRISDOL ) 1.25 MG (50000 UNIT) CAPS capsule Take 1 capsule (50,000 Units total) by mouth every 7 (seven) days. 11/07/22   Severa Rock HERO, FNP    Allergies: Calcium -containing compounds and Nsaids    Review of Systems  All other systems reviewed and are negative.   Updated Vital Signs There were  no vitals taken for this visit.  Physical Exam Vitals and nursing note reviewed.  Constitutional:      General: He is not in acute distress.    Appearance: He is well-developed.  HENT:     Head: Normocephalic and atraumatic.  Eyes:     Conjunctiva/sclera: Conjunctivae normal.  Cardiovascular:     Rate and Rhythm: Normal rate and regular rhythm.     Heart sounds: No murmur heard. Pulmonary:     Effort: Pulmonary effort is normal. No respiratory distress.     Breath sounds: Normal breath sounds.  Abdominal:     Palpations: Abdomen is soft.     Tenderness: There is no abdominal tenderness.   Musculoskeletal:        General: No swelling.     Cervical back: Neck supple.     Comments: No midline tenderness cervical, thoracic, lumbar spine without step-off or deformity.  Paraspinal tenderness in the upper thoracic region with tenderness just inferior to the left scapula and along medial border of scapula.  Patient will range left upper extremity fully.  Decreased grip strength left when compared to right.  5 out of 5 muscular strength at wrist, elbow, shoulder bilaterally.  No sensory deficits on major nerve distributions of upper or lower extremities.  Strength 5 out of 5 bilateral lower extremities.  Skin:    General: Skin is warm and dry.     Capillary Refill: Capillary refill takes less than 2 seconds.  Neurological:     Mental Status: He is alert.     Comments: Alert and oriented to self, place, time and event.   Speech is fluent, clear without dysarthria or dysphasia.   Strength/sensation of extremities see MSK  Normal gait.  CN I not tested  CN II not tested CN III, IV, VI PERRLA and EOMs intact bilaterally  CN V Intact sensation to sharp and light touch to the face  CN VII facial movements symmetric  CN VIII not tested  CN IX, X no uvula deviation, symmetric rise of soft palate  CN XI 5/5 SCM and trapezius strength bilaterally  CN XII Midline tongue protrusion, symmetric L/R movements     Psychiatric:        Mood and Affect: Mood normal.     (all labs ordered are listed, but only abnormal results are displayed) Labs Reviewed - No data to display  EKG: None  Radiology: No results found.   Procedures   Medications Ordered in the ED - No data to display  Clinical Course as of 11/26/23 1445  Thu Nov 26, 2023  1254 Consulted neurology Dr. Matthews regarding patient's symptoms who recommended MRI brain and cervical spine with and without contrast.  Will begin transfer process to [facility] to have imaging studies performed. [CR]  1324 Patient transferred  from [facility] for MRI. Claustrophobic. Added on Ativan  IV for anxiety prior to MRI. [OZ]    Clinical Course User Index [CR] Silver Wonda LABOR, PA [OZ] Cecily Legrand LABOR, PA-C                                 Medical Decision Making Amount and/or Complexity of Data Reviewed Labs: ordered. Radiology: ordered.  Risk Prescription drug management.   This patient presents to the ED for concern of back pain, left hand weakness, this involves an extensive number of treatment options, and is a complaint that carries with it a high  risk of complications and morbidity.  The differential diagnosis includes stroke, arterial dissection, nerve compression/impingement, muscular spasm, MS, disc herniation, tumor, fracture, dislocation, other   Co morbidities that complicate the patient evaluation  See HPI   Additional history obtained:  Additional history obtained from EMR External records from outside source obtained and reviewed including hospital records   Lab Tests:  I Ordered, and personally interpreted labs.  The pertinent results include: No leukocytosis.  No evidence of anemia.  Platelets within normal range.  No Electra abnormalities.  No renal dysfunction.   Imaging Studies ordered:  I ordered imaging studies including CT angio head and neck, CT cervical spine no charge I independently visualized and interpreted imaging which showed  CT angio head and neck: Pending at transfer MRI cervical spine and brain with and without contrast: Pending I agree with the radiologist interpretation   Cardiac Monitoring: / EKG:  The patient was maintained on a cardiac monitor.  I personally viewed and interpreted the cardiac monitored which showed an underlying rhythm of: Sinus rhythm   Consultations Obtained:  Consulted attending physician Dr. Charlyn who is in agreement with treatment plan going forward   Problem List / ED Course / Critical interventions / Medication management  Left hand  weakness, left upper back pain I ordered medication including Solu-Medrol , Flexeril , Lidoderm    Reevaluation of the patient after these medicines showed that the patient stayed the same I have reviewed the patients home medicines and have made adjustments as needed   Social Determinants of Health:  Cigarette use, denies illicit drug use.    Test / Admission - Considered:  Left hand weakness, left upper back pain Vitals signs within normal range and stable throughout visit. Laboratory/imaging studies significant for: see above 53 year old male presents to the ED with complaints of back pain, left hand weakness.  States that on Sunday he was sitting on his tailgate reached back with his left arm to turn his head to the left and felt a popping sensation in his left upper back.  Since then has had pain and spasm type sensation in his left back as well as difficulty grasping objects with his left hand as well as weakness in his left hand.  States that the weakness in his hand has improved and there is put ice on his back but tends to come back whenever area is not being iced.  Has been Tylenol  for the pain but not necessarily the hand weakness.  Denies any visual disturbance, gait abnormality, slurred speech, facial droop, weakness/sensor Solu-Medrol , Flexeril , Lidoderm  y deficits otherwise in upper extremities.  States he does have a history of cervical fusion that was done in [place]  around 10 years ago.  Denies any current neck pain, headache. On exam, decreased grip strength left when compared to right.  Patient also reporting difficulty with fine motor movements of left hand.  Nonfocal neurologic exam otherwise.  Some paraspinal tenderness in the left thoracic back region as above without real cervical spine or paraspinally in cervical region tenderness.  Given patient's symptoms beginning with rotation of the neck leftward with worsening of symptoms when doing so currently along with persistent  left hand weakness, consulted neurology.  Recommendation was to obtain a more brain and cervical spine without contrast.  CT angio head and neck pending at time of transfer.  Concern for CVA versus arterial dissection versus neurosarcoidosis versus spinal cord impingement/compression versus radiculopathy vs other.    Treatment plan discussed with patient and family  member and they note understanding were agreeable.  Patient stable upon transfer.     Final diagnoses:  None    ED Discharge Orders     None          Silver Wonda LABOR, [place] 11/26/23 1446    Charlyn Sora, MD 11/27/23 [phone]

## 2023-11-26 NOTE — ED Triage Notes (Signed)
 PT arrives via POV as a transfer from [facility]. Pt was sent here to have an MRI of his back. PT arrives [facility].

## 2023-11-26 NOTE — Telephone Encounter (Signed)
 Patient reports he was not aware that virtual visits could come at any time and reports he was on call at 5:03 for his 5:15 virtual appointment and was marked as a no show. He is upset and in pain. He also reports he never heard back about his therapy referral placed in February. He was given an appointment for next week. Patient states he does not want to go through the weekend in pain.

## 2023-11-28 ENCOUNTER — Telehealth (HOSPITAL_BASED_OUTPATIENT_CLINIC_OR_DEPARTMENT_OTHER): Payer: Self-pay | Admitting: Emergency Medicine

## 2023-11-28 MED ORDER — CYCLOBENZAPRINE HCL 10 MG PO TABS: 10.0000 mg | ORAL_TABLET | Freq: Two times a day (BID) | ORAL | 0 refills | Status: AC | PRN

## 2023-11-28 MED ORDER — METHYLPREDNISOLONE 4 MG PO TBPK: ORAL_TABLET | ORAL | 0 refills | Status: DC

## 2023-11-28 NOTE — Telephone Encounter (Cosign Needed)
 Patient seen in the ED 11/26/2023.  Was discharged without medications prescribed.  Will send in planned prescriptions in the form of Medrol  Dosepak, muscle relaxer.  Recommend appropriate follow-up as discussed at discharge.

## 2023-12-01 ENCOUNTER — Telehealth: Payer: Self-pay | Admitting: Family Medicine

## 2023-12-01 ENCOUNTER — Ambulatory Visit: Admitting: Nurse Practitioner

## 2023-12-01 NOTE — Telephone Encounter (Signed)
 The patient arrived early for his appointment with provider Mary-Margaret Gladis, scheduled for 10:15 AM. He reported receiving a text message instructing him to arrive at 10:00 AM, which led to some confusion.  During check-in, the patient expressed frustration regarding a prior video visit scheduled for July 3rd. He stated that he waited on the phone for 45 minutes without being connected to a provider. Ultimately, he was advised to go to the emergency room due to his symptoms. He shared that the experience left him feeling uncared for and unheard.  While I attempted to address his concerns and offer an apology for the inconvenience, the patient became visibly upset and left the office before the conversation could be completed.

## 2023-12-15 ENCOUNTER — Ambulatory Visit: Admitting: Professional Counselor

## 2023-12-15 DIAGNOSIS — F4310 Post-traumatic stress disorder, unspecified: Secondary | ICD-10-CM
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
## 2023-12-15 NOTE — Patient Instructions (Addendum)
 If your symptoms worsen or you have thoughts of suicide/homicide, PLEASE SEEK IMMEDIATE MEDICAL ATTENTION.  You may always call:   National Suicide Hotline: 988 or (657)189-0421 [place] Crisis Line: 343-299-2185 Crisis Recovery in [place]: [phone]     These are available 24 hours a day, 7 days a week.     Recommending patient to contact [facility] services in [place], [place].

## 2023-12-29 ENCOUNTER — Ambulatory Visit: Admitting: Professional Counselor

## 2023-12-29 DIAGNOSIS — F4310 Post-traumatic stress disorder, unspecified: Secondary | ICD-10-CM | POA: Diagnosis not present
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
## 2024-01-01 ENCOUNTER — Telehealth: Payer: Self-pay | Admitting: Professional Counselor

## 2024-01-01 DIAGNOSIS — F4310 Post-traumatic stress disorder, unspecified: Secondary | ICD-10-CM
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
## 2024-01-06 ENCOUNTER — Other Ambulatory Visit: Payer: Self-pay | Admitting: Family Medicine

## 2024-01-06 DIAGNOSIS — K219 Gastro-esophageal reflux disease without esophagitis: Secondary | ICD-10-CM

## 2024-01-12 ENCOUNTER — Ambulatory Visit: Admitting: Professional Counselor

## 2024-01-12 ENCOUNTER — Telehealth: Payer: Self-pay

## 2024-01-12 NOTE — Telephone Encounter (Signed)
 Copied from CRM [id number]. Topic: General - Other >> Jan 12, 2024 10:16 AM Sasha H wrote: Reason for CRM: Pt states his appointment with [name] this morning completely slipped his mind and he was looking forward to it and would like to reschedule

## 2024-01-16 NOTE — Addendum Note (Signed)
 Addended by: JACQUETTA SHARLOT GRADE on: 01/16/2024 09:45 PM   Modules accepted: Orders

## 2024-01-19 ENCOUNTER — Telehealth: Payer: Self-pay | Admitting: Cardiology

## 2024-01-19 NOTE — Telephone Encounter (Signed)
 Patient has an upcoming appointment on 9/30 clearance can be addressed at ov will make requesting office aware

## 2024-01-19 NOTE — Telephone Encounter (Signed)
   Pre-operative Risk Assessment    Patient Name: Henry Webb  DOB: September 09, 1970 MRN: [id number]   Date of last office visit: 08/20/23 Date of next office visit: 01/3024   Request for Surgical Clearance    Procedure:   right varicocelectomy with denervation   Date of Surgery:  Clearance 03/01/24                                Surgeon:  Dr. Lovie Socks Group or Practice Name:  [facility] Phone number:  [phone] Fax number:  [phone]   Type of Clearance Requested:   - Medical  - Pharmacy:  Hold Aspirin  5 days prior    Type of Anesthesia:  General    Additional requests/questions:     SignedBarbee DELENA Sharps   01/19/2024, 10:17 AM

## 2024-01-19 NOTE — Telephone Encounter (Signed)
 Primary Cardiologist:Branch, Dorn, MD  Chart reviewed as part of pre-operative protocol coverage. Because of Henry Webb's past medical history and time since last visit, he/she will require a follow-up visit in order to better assess preoperative cardiovascular risk.  Pre-op covering staff: - Patient has an appointment with Dr. Alvan on 02/23/24 at which time clearance will be addressed. Appointment notes have been updated.  - Please contact requesting surgeon's office via preferred method (i.e, phone, fax) to inform them of need for appointment prior to surgery.  Request to hold aspirin  will be addressed by primary cardiologist at time of visit.   [name], NP-C  01/19/2024, 12:31 PM [address] [place], [place] [address] Office [phone] Fax [phone]

## 2024-02-08 ENCOUNTER — Other Ambulatory Visit: Payer: Self-pay | Admitting: Family Medicine

## 2024-02-08 DIAGNOSIS — F4310 Post-traumatic stress disorder, unspecified: Secondary | ICD-10-CM

## 2024-02-08 DIAGNOSIS — F411 Generalized anxiety disorder: Secondary | ICD-10-CM

## 2024-02-08 DIAGNOSIS — F339 Major depressive disorder, recurrent, unspecified: Secondary | ICD-10-CM

## 2024-02-09 NOTE — Progress Notes (Deleted)
 Psychiatric Initial Adult Assessment  Patient Identification: Henry Webb MRN:  [id number] Date of Evaluation:  02/09/2024 Referral Source:  Severa Rock HERO, FNP  Assessment:  Henry Webb is a 53 y.o. male with a history of *** who presents in person to [facility] for initial evaluation of PTSD.  Patient reports ***  The patient's presentation is most consistent with a principal diagnosis of ***, as evidenced by ***.   Relevant Chart Review Notes/Encounters: *** Labs/Imaging: ***   Risk Assessment: A suicide and violence risk assessment was performed as part of this evaluation. There patient is deemed to be at chronic elevated risk for self-harm/suicide given the following factors: {SABSUICIDERISKFACTORS:29780}. These risk factors are mitigated by the following factors: {SABSUICIDEPROTECTIVEFACTORS:29779}. The patient is deemed to be at chronic elevated risk for violence given the following factors: {SABVIOLENCERISKFACTORS:29781}. These risk factors are mitigated by the following factors: {SABVIOLENCEPROTECTIVEFACTORS:29782}. There is no *** acute risk for suicide or violence at this time. The patient was educated about relevant modifiable risk factors including following recommendations for treatment of psychiatric illness and abstaining from substance abuse.  While future psychiatric events cannot be accurately predicted, the patient does not *** currently require  acute inpatient psychiatric care and does not *** currently meet [place]  involuntary commitment criteria.    Plan:  # PTSD Past medication trials: sertraline , venlefaxine Status of problem: new to me Interventions: -- *** -- ***counseling with  [name]  # *** Past medication trials:  Status of problem: new to me Interventions: -- ***  # *** Past medication trials:  Status of problem: new to me Interventions: -- ***  Health Maintenance PCP: Severa Rock HERO, FNP  @ No data recorded  Patient was given contact information for behavioral health clinic and was instructed to call 911 for emergencies.  [name] will be discussed with the Attending MD ,Dr. ***, who agrees with the above statement and plan.   Subjective:  Chief Complaint: No chief complaint on file.   History of Present Illness:   *** The patient is here for ***. The patient reports a *** history of ***.  The patient's stressors include ***.   Sleep: *** Snoring: *** Caffeine: ***   Psychiatric ROS Depression: *** The patient denied any current symptoms of depression/***The patient reports a *** history of low mood and anhedonia, characterized by {Depression:32915}. Suicidal thoughts are ***. The patient denies any symptoms of ***.   Anxiety: *** The patient denied any current symptoms of anxiety/ ***The patient reports a *** history of excessive anxiety and worry about a number of events, characterized by {HJI:67083}.   Panic attacks: *** The patient denies any current or past history of panic attacks/*** The patient reports experiencing of intense surge of fear, in which the following symptoms develop: {Panic Attacks:32917}  Mania/Hypomania: *** Negative for any past or current symptoms of expansive energy or mood/ The patient reports a *** history of episodes of expansive energy and mood that can last up to *** days characterized by {MANIA:32922}    Last episode reported by patient: ***   PSTD: *** Negative for any history of trauma / ***The patient reports past exposure to ***.  Intrusions s/xs: ***  Hyperarousal s/xs: ***  Avoidance s/xs: ***  Negative effects on cognition and mood: ***  Eating Disorder: The patient denies any current or past history of disordered eating that includes restrictive, binging, purging behaviors, or any other compensatory behaviors / ***  IED: ***/ The patient reports a ** history of impulsive aggression characterized by  {PZI:67078}  Psychosis: *** The patient denies any history of hallucinations, disorganized thinking, paranoia, or delusions. / ***    Safety: Active SI: ***Denied Passive SI: *** Access to firearms: *** Psychosis: as above  ROS   Past Psychiatric History:  Diagnoses: *** Medication trials: *** Previous psychiatrist/therapist: *** Hospitalizations: *** Suicide attempts: *** NSSIB Hx: *** Hx of violence towards others: *** Hx of trauma/abuse: ***  Substance Abuse History: Alcohol:  Hx of withdrawal seizures/Dts: *** Tobacco: *** Cannabis: *** Other Illicit Substance Use: IVDU: denied Detox Hx: *** Rehab Hx: ***  Past Medical History PCP: Severa Rock HERO, FNP  Dx: *** ALL: *** Calcium -containing compounds and Nsaids  Head trauma: *** Seizures: ***   Family Psychiatric History: *** Psychiatric disorders: *** Suicide hx: *** Homicide: ***  Social History: Living situation: *** Occupational status: *** Educational history: *** Marital Status: *** Children: *** Legal Hx: *** DUI/DWI: *** Military Hx: *** Developmental Hx: School Performance: Upbringing/Relationship with parents: Major Family stressors:  Past Medical History:  Past Medical History:  Diagnosis Date   Anxiety    Arthritis    CAD (coronary artery disease)    Depression    Fibromyalgia    GERD (gastroesophageal reflux disease)    History of gout    Kidney stones    Migraine    Morbid obesity (HCC)    Palpitations    Sarcoidosis    Tachycardia    Testicular hypofunction     Past Surgical History:  Procedure Laterality Date   COLONOSCOPY WITH PROPOFOL  N/A 07/02/2022   Procedure: COLONOSCOPY WITH PROPOFOL ;  Surgeon: Shaaron Lamar HERO, MD;  [facility];  Service: Endoscopy;  Laterality: N/A;  9:15 am   CORONARY PRESSURE/FFR STUDY N/A 07/31/2020   Procedure: INTRAVASCULAR PRESSURE WIRE/FFR STUDY;  Surgeon: Swaziland, Peter M, MD;  Location: [facility] INVASIVE CV LAB;  Service:  Cardiovascular;  Laterality: N/A;   CORONARY STENT INTERVENTION N/A 07/31/2020   Procedure: CORONARY STENT INTERVENTION;  Surgeon: Swaziland, Peter M, MD;  Location: [facility] INVASIVE CV LAB;  Service: Cardiovascular;  Laterality: N/A;   ESOPHAGOGASTRODUODENOSCOPY (EGD) WITH PROPOFOL  N/A 07/02/2022   Procedure: ESOPHAGOGASTRODUODENOSCOPY (EGD) WITH PROPOFOL ;  Surgeon: Shaaron Lamar HERO, MD;  Location: [facility];  Service: Endoscopy;  Laterality: N/A;   Fusion  2015   c 3-5- cevical fusion   LEFT HEART CATH AND CORONARY ANGIOGRAPHY N/A 07/31/2020   Procedure: LEFT HEART CATH AND CORONARY ANGIOGRAPHY;  Surgeon: Swaziland, Peter M, MD;  Location: [facility] INVASIVE CV LAB;  Service: Cardiovascular;  Laterality: N/A;   LITHOTRIPSY     MALONEY DILATION N/A 07/02/2022   Procedure: AGAPITO DILATION;  Surgeon: Shaaron Lamar HERO, MD;  Location: [facility];  Service: Endoscopy;  Laterality: N/A;   POLYPECTOMY  07/02/2022   Procedure: POLYPECTOMY;  Surgeon: Shaaron Lamar HERO, MD;  Location: [facility];  Service: Endoscopy;;    Family History:  Family History  Problem Relation Age of Onset   Depression Mother    Ovarian cancer Mother    Rheum arthritis Mother    Depression Father    Heart attack Father    Hypertension Father    Ehlers-Danlos syndrome Sister    Depression Daughter    Headache Daughter    Colon cancer Cousin 66    Social History:   Social History   Socioeconomic History   Marital status: Married    Spouse name: Not on file  Number of children: 3   Years of education: Not on file   Highest education level: Some college, no degree  Occupational History   Occupation: Retired    Comment: Previously in [employer] x 22 years  Tobacco Use   Smoking status: Former    Current packs/day: 0.00    Types: Cigarettes    Quit date: 1996    Years since quitting: 29.7   Smokeless tobacco: Current    Types: Snuff  Vaping Use   Vaping status: Never Used  Substance and Sexual Activity    Alcohol use: Yes    Comment: rare - beer   Drug use: Yes    Types: Marijuana    Comment: use daily- smoke a bowl 5 x daily   Sexual activity: Yes  Other Topics Concern   Not on file  Social History Narrative   Not on file   Social Drivers of Health   Financial Resource Strain: Low Risk  (12/01/2023)   Overall Financial Resource Strain (CARDIA)    Difficulty of Paying Living Expenses: Not hard at all  Food Insecurity: No Food Insecurity (12/01/2023)   Hunger Vital Sign    Worried About Running Out of Food in the Last Year: Never true    Ran Out of Food in the Last Year: Never true  Transportation Needs: No Transportation Needs (12/01/2023)   PRAPARE - Administrator, Civil Service (Medical): No    Lack of Transportation (Non-Medical): No  Physical Activity: Sufficiently Active (12/01/2023)   Exercise Vital Sign    Days of Exercise per Week: 7 days    Minutes of Exercise per Session: 60 min  Stress: No Stress Concern Present (12/01/2023)   [employer] of Occupational Health - Occupational Stress Questionnaire    Feeling of Stress: Only a little  Social Connections: Socially Isolated (12/01/2023)   Social Connection and Isolation Panel    Frequency of Communication with Friends and Family: Three times a week    Frequency of Social Gatherings with Friends and Family: Never    Attends Religious Services: Never    Database administrator or Organizations: No    Attends Engineer, structural: Not on file    Marital Status: Separated    Allergies:   Allergies  Allergen Reactions   Calcium -Containing Compounds     Flares sarcoidosis    Nsaids Other (See Comments)    High blood pressure    Current Medications: Current Outpatient Medications  Medication Sig Dispense Refill   amLODipine  (NORVASC ) 5 MG tablet TAKE 1 TABLET(5 MG) BY MOUTH DAILY 90 tablet 3   aspirin  EC 81 MG tablet Take 1 tablet (81 mg total) by mouth daily. Swallow whole. 90 tablet 3    cyclobenzaprine  (FLEXERIL ) 10 MG tablet Take 1 tablet (10 mg total) by mouth 2 (two) times daily as needed for muscle spasms. 20 tablet 0   isosorbide  mononitrate (IMDUR ) 30 MG 24 hr tablet TAKE 1 TABLET(30 MG) BY MOUTH DAILY 90 tablet 3   methylPREDNISolone  (MEDROL  DOSEPAK) 4 MG TBPK tablet See dosepack instructions 21 each 0   nitroGLYCERIN  (NITROSTAT ) 0.4 MG SL tablet PLACE 1 TABLET UNDER THE TONGUE AS NEEDED FOR CHEST PAIN. MAY REPEAT IN 5 MINUTES IF SYMPTOMS STILL PRESENT. MAX 3 DOSES IN 15 MINUTES. 25 tablet 6   nystatin (MYCOSTATIN) 100000 UNIT/ML suspension Take 5 mLs by mouth 4 (four) times daily as needed.     pantoprazole  (PROTONIX ) 40 MG tablet Take 1  tablet (40 mg total) by mouth daily. **NEEDS TO BE SEEN BEFORE NEXT REFILL** 30 tablet 0   propranolol  ER (INDERAL  LA) 160 MG SR capsule TAKE 1 CAPSULE(160 MG) BY MOUTH DAILY 90 capsule 2   rosuvastatin  (CRESTOR ) 40 MG tablet TAKE 1 TABLET BY MOUTH EVERY DAY 90 tablet 3   sertraline  (ZOLOFT ) 100 MG tablet Take 1 tablet (100 mg total) by mouth daily. **NEEDS TO BE SEEN BEFORE NEXT REFILL** 30 tablet 0   Venlafaxine  HCl 225 MG TB24 Take 1 tablet (225 mg total) by mouth daily. 90 tablet 1   Vitamin D , Ergocalciferol , (DRISDOL ) 1.25 MG (50000 UNIT) CAPS capsule Take 1 capsule (50,000 Units total) by mouth every 7 (seven) days. 5 capsule 3   No current facility-administered medications for this visit.     Objective: Psychiatric Specialty Exam: General Appearance: Casual, fairly groomed  Eye Contact:  Good    Speech:  Clear, coherent, normal rate, spontaneous  Volume:  Normal   Mood:  see above  Affect:  Appropriate, congruent, full range  Thought Content: Logical, rumination  ***  Suicidal Thoughts: see subjective  Thought Process:  Coherent, goal-directed, circumstantial ***  Orientation:  A&Ox4   Memory:  Immediate good  Judgment:  Fair   Insight:  Fair***  Concentration:  Attention and concentration good   Recall:  Good  Fund  of Knowledge: Good  Language: Good, fluent  Psychomotor Activity: Normal  Akathisia:  NA   AIMS (if indicated): NA   Assets:   {Assets (PAA):22698}  ADL's:  Intact  Cognition: WNL  Sleep: see above  Appetite: see above    Physical Exam    Metabolic Disorder Labs: Lab Results  Component Value Date   HGBA1C 5.6 03/17/2023   No results found for: PROLACTIN Lab Results  Component Value Date   CHOL 156 03/17/2023   TRIG 197 (H) 03/17/2023   HDL 37 (L) 03/17/2023   CHOLHDL 4.2 03/17/2023   LDLCALC 85 03/17/2023   Lab Results  Component Value Date   TSH 1.620 03/17/2023    Therapeutic Level Labs: No results found for: LITHIUM No results found for: CBMZ No results found for: VALPROATE   Marlo Masson, MD 9/16/20253:24 PM

## 2024-02-10 ENCOUNTER — Encounter: Payer: Self-pay | Admitting: Family Medicine

## 2024-02-10 ENCOUNTER — Other Ambulatory Visit: Payer: Self-pay | Admitting: Family Medicine

## 2024-02-10 ENCOUNTER — Ambulatory Visit (HOSPITAL_COMMUNITY): Admitting: Student in an Organized Health Care Education/Training Program

## 2024-02-10 DIAGNOSIS — K219 Gastro-esophageal reflux disease without esophagitis: Secondary | ICD-10-CM

## 2024-02-10 NOTE — Telephone Encounter (Signed)
 Letter mailed

## 2024-02-10 NOTE — Telephone Encounter (Signed)
 Henry Webb pt NTBS 30-d given 01/07/24

## 2024-02-11 ENCOUNTER — Other Ambulatory Visit: Payer: Self-pay | Admitting: Family Medicine

## 2024-02-11 DIAGNOSIS — K219 Gastro-esophageal reflux disease without esophagitis: Secondary | ICD-10-CM

## 2024-02-11 NOTE — Telephone Encounter (Signed)
 Copied from CRM [id number]. Topic: Clinical - Medication Refill >> Feb 11, 2024  1:48 PM Sophia H wrote: Medication: pantoprazole  (PROTONIX ) 40 MG tablet **Patient is requesting bridge to get to appt 09/30   Has the patient contacted their pharmacy? Yes, needs appt per pharmacy.   This is the patient's preferred pharmacy:  [facility] [phone] - [place], [place] - [address] AT [facility] [address] [place] [address] Phone: [phone] Fax: [phone]   Is this the correct pharmacy for this prescription? Yes If no, delete pharmacy and type the correct one.   Has the prescription been filled recently? Yes  Is the patient out of the medication? Yes  Has the patient been seen for an appointment in the last year OR does the patient have an upcoming appointment? Yes, 09/30.  Can we respond through MyChart? Yes  Agent: Please be advised that Rx refills may take up to 3 business days. We ask that you follow-up with your pharmacy.

## 2024-02-22 ENCOUNTER — Other Ambulatory Visit: Payer: Self-pay | Admitting: Family Medicine

## 2024-02-22 DIAGNOSIS — F339 Major depressive disorder, recurrent, unspecified: Secondary | ICD-10-CM

## 2024-02-22 DIAGNOSIS — F4310 Post-traumatic stress disorder, unspecified: Secondary | ICD-10-CM

## 2024-02-22 DIAGNOSIS — F411 Generalized anxiety disorder: Secondary | ICD-10-CM

## 2024-02-23 ENCOUNTER — Encounter: Payer: Self-pay | Admitting: Cardiology

## 2024-02-23 ENCOUNTER — Encounter: Payer: Self-pay | Admitting: Family Medicine

## 2024-02-23 ENCOUNTER — Ambulatory Visit: Admitting: Family Medicine

## 2024-02-23 ENCOUNTER — Ambulatory Visit: Attending: Cardiology | Admitting: Cardiology

## 2024-02-23 VITALS — BP 119/77 | HR 93 | Temp 97.8°F | Ht 73.0 in | Wt 234.0 lb

## 2024-02-23 VITALS — BP 109/74 | HR 90 | Ht 73.0 in | Wt 235.6 lb

## 2024-02-23 DIAGNOSIS — F321 Major depressive disorder, single episode, moderate: Secondary | ICD-10-CM | POA: Insufficient documentation

## 2024-02-23 DIAGNOSIS — E782 Mixed hyperlipidemia: Secondary | ICD-10-CM

## 2024-02-23 DIAGNOSIS — I2510 Atherosclerotic heart disease of native coronary artery without angina pectoris: Secondary | ICD-10-CM | POA: Diagnosis not present

## 2024-02-23 DIAGNOSIS — F4310 Post-traumatic stress disorder, unspecified: Secondary | ICD-10-CM | POA: Diagnosis not present

## 2024-02-23 DIAGNOSIS — R7303 Prediabetes: Secondary | ICD-10-CM

## 2024-02-23 DIAGNOSIS — K219 Gastro-esophageal reflux disease without esophagitis: Secondary | ICD-10-CM | POA: Diagnosis not present

## 2024-02-23 DIAGNOSIS — F411 Generalized anxiety disorder: Secondary | ICD-10-CM

## 2024-02-23 DIAGNOSIS — F339 Major depressive disorder, recurrent, unspecified: Secondary | ICD-10-CM

## 2024-02-23 DIAGNOSIS — I493 Ventricular premature depolarization: Secondary | ICD-10-CM

## 2024-02-23 DIAGNOSIS — R002 Palpitations: Secondary | ICD-10-CM

## 2024-02-23 DIAGNOSIS — I10 Essential (primary) hypertension: Secondary | ICD-10-CM

## 2024-02-23 DIAGNOSIS — E559 Vitamin D deficiency, unspecified: Secondary | ICD-10-CM

## 2024-02-23 DIAGNOSIS — N50811 Right testicular pain: Secondary | ICD-10-CM | POA: Insufficient documentation

## 2024-02-23 LAB — LIPID PANEL

## 2024-02-23 LAB — BAYER DCA HB A1C WAIVED: HB A1C (BAYER DCA - WAIVED): 5.5 % (ref 4.8–5.6)

## 2024-02-23 MED ORDER — VENLAFAXINE HCL ER 225 MG PO TB24: 1.0000 | ORAL_TABLET | Freq: Every day | ORAL | 0 refills | Status: DC

## 2024-02-23 MED ORDER — PANTOPRAZOLE SODIUM 40 MG PO TBEC: 40.0000 mg | DELAYED_RELEASE_TABLET | Freq: Every day | ORAL | 3 refills | Status: AC

## 2024-02-23 MED ORDER — SERTRALINE HCL 100 MG PO TABS: 100.0000 mg | ORAL_TABLET | Freq: Every day | ORAL | 0 refills | Status: DC

## 2024-02-23 NOTE — Progress Notes (Signed)
 Subjective:  Patient ID: Henry Webb, male    DOB: 29-Jan-1971, 53 y.o.   MRN: [id number]  Patient Care Team: Severa Rock HERO, FNP as PCP - General (Family Medicine) Alvan, Dorn FALCON, MD as PCP - Cardiology (Cardiology) Shaaron Lamar HERO, MD as Consulting Physician (Gastroenterology)   Chief Complaint:  Medical Management of Chronic Issues and Ear Pain (Left-ongoing pain )   HPI: Henry Webb is a 53 y.o. male presenting on 02/23/2024 for Medical Management of Chronic Issues and Ear Pain (Left-ongoing pain )   Henry Webb is a 53 year old male who presents for a regular checkup and medication refills.  He has been experiencing persistent depression, with some improvement over the past week. He is currently taking venlafaxine  and sertraline  for depression and feels these medications may need to be changed.  He has been without Protonix  for a week due to a refill issue and reports experiencing pain from reflux. He was informed that a refill would not be processed until he had a consultation, which has caused frustration.  A few weeks ago, he experienced a spasm in his back, leading to an emergency room visit where a stroke was initially suspected. It was later determined to be a spasm. He manages the spasms with stretches and exercises.  He reports ongoing testicular pain, which was temporarily relieved by an injection containing prednisone  and other medications. He was scheduled for surgery to address the issue but had to cancel due to a commitment in [place] . He is awaiting rescheduling of the procedure.  He experiences headaches that worsen with weather changes and has tried various medications, including one that was effective but not covered by insurance. He has not tried Singulair for this issue.  He had a spinal tap in the past that resulted in a leak, causing significant discomfort until a blood patch was applied, which provided immediate relief.  He is  taking cholesterol medication without experiencing muscle aches or pains. He avoids vitamin D  supplements due to a history of sarcoidosis and kidney stones, and he uses SPF 100 sunscreen to prevent sun exposure-related headaches.  He has made dietary changes to manage his prediabetes, including consuming oatmeal or Cheerios for breakfast and incorporating green shakes and fruit drinks into his diet. He avoids vegetables in solid form due to preference.         02/23/2024   10:30 AM 12/29/2023    1:37 PM 12/15/2023    1:11 PM 05/06/2023    1:06 PM 03/17/2023    2:55 PM  Depression screen PHQ 2/9  Decreased Interest 3 2 3 1 2   Down, Depressed, Hopeless 3 3 3 3 2   PHQ - 2 Score 6 5 6 4 4   Altered sleeping 0 1 1 1 3   Tired, decreased energy 0 1 1 2  0  Change in appetite 0 0 0 0 0  Feeling bad or failure about yourself  0 0 1 1 1   Trouble concentrating 1 2 2 2 2   Moving slowly or fidgety/restless 0 0 0 0 0  Suicidal thoughts 0 0 0 0 0  PHQ-9 Score 7 9 11 10 10   Difficult doing work/chores Somewhat difficult Very difficult Somewhat difficult Very difficult Somewhat difficult      02/23/2024   10:30 AM 12/29/2023    1:38 PM 12/15/2023    1:15 PM 05/06/2023    1:07 PM  GAD 7 : Generalized Anxiety Score  Nervous, Anxious, on Edge 0  1 0 0  Control/stop worrying 0 0 0 0  Worry too much - different things 0 0 0 0  Trouble relaxing 2 2 1 2   Restless 2 1 2 1   Easily annoyed or irritable 1 2 2 1   Afraid - awful might happen 0 0 0 0  Total GAD 7 Score 5 6 5 4   Anxiety Difficulty Somewhat difficult Very difficult Somewhat difficult Somewhat difficult        Relevant past medical, surgical, family, and social history reviewed and updated as indicated.  Allergies and medications reviewed and updated. Data reviewed: Chart in Epic.   Past Medical History:  Diagnosis Date   Anxiety    Arthritis    CAD (coronary artery disease)    Depression    Fibromyalgia    GERD (gastroesophageal reflux  disease)    History of gout    Kidney stones    Migraine    Morbid obesity (HCC)    Palpitations    Sarcoidosis    Tachycardia    Testicular hypofunction     Past Surgical History:  Procedure Laterality Date   COLONOSCOPY WITH PROPOFOL  N/A 07/02/2022   Procedure: COLONOSCOPY WITH PROPOFOL ;  Surgeon: Shaaron Lamar HERO, MD;  [facility];  Service: Endoscopy;  Laterality: N/A;  9:15 am   CORONARY PRESSURE/FFR STUDY N/A 07/31/2020   Procedure: INTRAVASCULAR PRESSURE WIRE/FFR STUDY;  Surgeon: Swaziland, Peter M, MD;  Location: [facility] INVASIVE CV LAB;  Service: Cardiovascular;  Laterality: N/A;   CORONARY STENT INTERVENTION N/A 07/31/2020   Procedure: CORONARY STENT INTERVENTION;  Surgeon: Swaziland, Peter M, MD;  Location: [facility] INVASIVE CV LAB;  Service: Cardiovascular;  Laterality: N/A;   ESOPHAGOGASTRODUODENOSCOPY (EGD) WITH PROPOFOL  N/A 07/02/2022   Procedure: ESOPHAGOGASTRODUODENOSCOPY (EGD) WITH PROPOFOL ;  Surgeon: Shaaron Lamar HERO, MD;  Location: [facility];  Service: Endoscopy;  Laterality: N/A;   Fusion  2015   c 3-5- cevical fusion   LEFT HEART CATH AND CORONARY ANGIOGRAPHY N/A 07/31/2020   Procedure: LEFT HEART CATH AND CORONARY ANGIOGRAPHY;  Surgeon: Swaziland, Peter M, MD;  Location: [facility] INVASIVE CV LAB;  Service: Cardiovascular;  Laterality: N/A;   LITHOTRIPSY     MALONEY DILATION N/A 07/02/2022   Procedure: AGAPITO DILATION;  Surgeon: Shaaron Lamar HERO, MD;  Location: [facility];  Service: Endoscopy;  Laterality: N/A;   POLYPECTOMY  07/02/2022   Procedure: POLYPECTOMY;  Surgeon: Shaaron Lamar HERO, MD;  Location: [facility];  Service: Endoscopy;;    Social History   Socioeconomic History   Marital status: Married    Spouse name: Not on file   Number of children: 3   Years of education: Not on file   Highest education level: Some college, no degree  Occupational History   Occupation: Retired    Comment: Previously in [employer] x 22 years  Tobacco Use   Smoking  status: Former    Current packs/day: 0.00    Types: Cigarettes    Quit date: 1996    Years since quitting: 29.7   Smokeless tobacco: Current    Types: Snuff  Vaping Use   Vaping status: Never Used  Substance and Sexual Activity   Alcohol use: Yes    Comment: rare - beer   Drug use: Yes    Types: Marijuana    Comment: use daily- smoke a bowl 5 x daily   Sexual activity: Yes  Other Topics Concern   Not on file  Social History Narrative   Not  on file   Social Drivers of Health   Financial Resource Strain: Low Risk  (12/01/2023)   Overall Financial Resource Strain (CARDIA)    Difficulty of Paying Living Expenses: Not hard at all  Food Insecurity: No Food Insecurity (12/01/2023)   Hunger Vital Sign    Worried About Running Out of Food in the Last Year: Never true    Ran Out of Food in the Last Year: Never true  Transportation Needs: No Transportation Needs (12/01/2023)   PRAPARE - Administrator, Civil Service (Medical): No    Lack of Transportation (Non-Medical): No  Physical Activity: Sufficiently Active (12/01/2023)   Exercise Vital Sign    Days of Exercise per Week: 7 days    Minutes of Exercise per Session: 60 min  Stress: No Stress Concern Present (12/01/2023)   [employer] of Occupational Health - Occupational Stress Questionnaire    Feeling of Stress: Only a little  Social Connections: Socially Isolated (12/01/2023)   Social Connection and Isolation Panel    Frequency of Communication with Friends and Family: Three times a week    Frequency of Social Gatherings with Friends and Family: Never    Attends Religious Services: Never    Database administrator or Organizations: No    Attends Engineer, structural: Not on file    Marital Status: Separated  Intimate Partner Violence: Not on file    Outpatient Encounter Medications as of 02/23/2024  Medication Sig   amLODipine  (NORVASC ) 5 MG tablet TAKE 1 TABLET(5 MG) BY MOUTH DAILY   aspirin  EC 81 MG  tablet Take 1 tablet (81 mg total) by mouth daily. Swallow whole.   cyclobenzaprine  (FLEXERIL ) 10 MG tablet Take 1 tablet (10 mg total) by mouth 2 (two) times daily as needed for muscle spasms.   isosorbide  mononitrate (IMDUR ) 30 MG 24 hr tablet TAKE 1 TABLET(30 MG) BY MOUTH DAILY   methylPREDNISolone  (MEDROL  DOSEPAK) 4 MG TBPK tablet See dosepack instructions   nitroGLYCERIN  (NITROSTAT ) 0.4 MG SL tablet PLACE 1 TABLET UNDER THE TONGUE AS NEEDED FOR CHEST PAIN. MAY REPEAT IN 5 MINUTES IF SYMPTOMS STILL PRESENT. MAX 3 DOSES IN 15 MINUTES.   nystatin (MYCOSTATIN) 100000 UNIT/ML suspension Take 5 mLs by mouth 4 (four) times daily as needed.   propranolol  ER (INDERAL  LA) 160 MG SR capsule TAKE 1 CAPSULE(160 MG) BY MOUTH DAILY   rosuvastatin  (CRESTOR ) 40 MG tablet TAKE 1 TABLET BY MOUTH EVERY DAY   [DISCONTINUED] Venlafaxine  HCl 225 MG TB24 TAKE 1 TABLET(225 MG) BY MOUTH DAILY   pantoprazole  (PROTONIX ) 40 MG tablet Take 1 tablet (40 mg total) by mouth daily.   sertraline  (ZOLOFT ) 100 MG tablet Take 1 tablet (100 mg total) by mouth daily.   Venlafaxine  HCl 225 MG TB24 Take 1 tablet (225 mg total) by mouth daily at 6 (six) AM.   [DISCONTINUED] pantoprazole  (PROTONIX ) 40 MG tablet Take 1 tablet (40 mg total) by mouth daily. **NEEDS TO BE SEEN BEFORE NEXT REFILL**   [DISCONTINUED] sertraline  (ZOLOFT ) 100 MG tablet Take 1 tablet (100 mg total) by mouth daily. **NEEDS TO BE SEEN BEFORE NEXT REFILL**   [DISCONTINUED] Venlafaxine  HCl 225 MG TB24 Take 1 tablet (225 mg total) by mouth daily.   [DISCONTINUED] Vitamin D , Ergocalciferol , (DRISDOL ) 1.25 MG (50000 UNIT) CAPS capsule Take 1 capsule (50,000 Units total) by mouth every 7 (seven) days.   No facility-administered encounter medications on file as of 02/23/2024.    Allergies  Allergen Reactions  Calcium -Containing Compounds     Flares sarcoidosis    Nsaids Other (See Comments)    High blood pressure    Pertinent ROS per HPI, otherwise  unremarkable      Objective:  BP 119/77   Pulse 93   Temp 97.8 F (36.6 C)   Ht 6' 1 (1.854 m)   Wt 234 lb (106.1 kg)   SpO2 95%   BMI 30.87 kg/m    Wt Readings from Last 3 Encounters:  02/23/24 234 lb (106.1 kg)  11/26/23 225 lb (102.1 kg)  09/08/23 225 lb (102.1 kg)    Physical Exam Vitals and nursing note reviewed.  Constitutional:      General: He is not in acute distress.    Appearance: Normal appearance. He is obese. He is not ill-appearing, toxic-appearing or diaphoretic.  HENT:     Head: Normocephalic and atraumatic.     Nose: Nose normal.     Mouth/Throat:     Mouth: Mucous membranes are moist.  Eyes:     Conjunctiva/sclera: Conjunctivae normal.     Pupils: Pupils are equal, round, and reactive to light.  Cardiovascular:     Rate and Rhythm: Normal rate and regular rhythm.     Heart sounds: Normal heart sounds.  Pulmonary:     Effort: Pulmonary effort is normal.     Breath sounds: Normal breath sounds.  Musculoskeletal:     Cervical back: Neck supple.  Skin:    General: Skin is warm and dry.     Capillary Refill: Capillary refill takes less than 2 seconds.  Neurological:     General: No focal deficit present.     Mental Status: He is alert and oriented to person, place, and time.  Psychiatric:        Mood and Affect: Mood normal.        Behavior: Behavior normal.        Thought Content: Thought content normal.        Judgment: Judgment normal.     Results for orders placed or performed during the hospital encounter of 11/26/23  Basic metabolic panel   Collection Time: 11/26/23 11:40 AM  Result Value Ref Range   Sodium 140 135 - 145 mmol/L   Potassium 3.9 3.5 - 5.1 mmol/L   Chloride 105 98 - 111 mmol/L   CO2 24 22 - 32 mmol/L   Glucose, Bld 112 (H) 70 - 99 mg/dL   BUN 15 6 - 20 mg/dL   Creatinine, Ser 9.21 0.61 - 1.24 mg/dL   Calcium  9.3 8.9 - 10.3 mg/dL   GFR, Estimated >39 >39 mL/min   Anion gap 11 5 - 15  CBC   Collection Time:  11/26/23 11:40 AM  Result Value Ref Range   WBC 8.1 4.0 - 10.5 K/uL   RBC 4.69 4.22 - 5.81 MIL/uL   Hemoglobin 13.7 13.0 - 17.0 g/dL   HCT 59.4 60.9 - 47.9 %   MCV 86.4 80.0 - 100.0 fL   MCH 29.2 26.0 - 34.0 pg   MCHC 33.8 30.0 - 36.0 g/dL   RDW 87.2 88.4 - 84.4 %   Platelets 337 150 - 400 K/uL   nRBC 0.0 0.0 - 0.2 %       Pertinent labs & imaging results that were available during my care of the patient were reviewed by me and considered in my medical decision making.  Assessment & Plan:  Delaine was seen today for medical management of chronic issues and  ear pain.  Diagnoses and all orders for this visit:  Depression, major, single episode, moderate (HCC) -     sertraline  (ZOLOFT ) 100 MG tablet; Take 1 tablet (100 mg total) by mouth daily. -     Venlafaxine  HCl 225 MG TB24; Take 1 tablet (225 mg total) by mouth daily at 6 (six) AM. -     CMP14+EGFR -     CBC with Differential/Platelet -     TSH -     VITAMIN D  25 Hydroxy (Vit-D Deficiency, Fractures) -     T4, Free  PTSD (post-traumatic stress disorder) -     sertraline  (ZOLOFT ) 100 MG tablet; Take 1 tablet (100 mg total) by mouth daily. -     Venlafaxine  HCl 225 MG TB24; Take 1 tablet (225 mg total) by mouth daily at 6 (six) AM. -     CMP14+EGFR -     CBC with Differential/Platelet -     TSH -     VITAMIN D  25 Hydroxy (Vit-D Deficiency, Fractures) -     T4, Free  Generalized anxiety disorder -     sertraline  (ZOLOFT ) 100 MG tablet; Take 1 tablet (100 mg total) by mouth daily. -     Venlafaxine  HCl 225 MG TB24; Take 1 tablet (225 mg total) by mouth daily at 6 (six) AM. -     CMP14+EGFR -     CBC with Differential/Platelet -     TSH -     VITAMIN D  25 Hydroxy (Vit-D Deficiency, Fractures) -     T4, Free  Gastroesophageal reflux disease without esophagitis -     pantoprazole  (PROTONIX ) 40 MG tablet; Take 1 tablet (40 mg total) by mouth daily. -     CMP14+EGFR -     CBC with Differential/Platelet  Essential  hypertension -     CMP14+EGFR -     CBC with Differential/Platelet -     Lipid panel -     TSH -     T4, Free  Vitamin D  deficiency -     CMP14+EGFR -     VITAMIN D  25 Hydroxy (Vit-D Deficiency, Fractures)  Mixed hyperlipidemia -     CMP14+EGFR -     Lipid panel  Prediabetes -     CMP14+EGFR -     Bayer DCA Hb A1c Waived  Pain in right testicle Followed by urology on a regular basis     Depression Symptoms persistent with slight improvement. Currently on venlafaxine  and sertraline . - Prescribe 30-day supply of venlafaxine  and sertraline  to prevent abrupt discontinuation. - Consult psychiatrist for potential medication adjustments.  Gastroesophageal reflux disease (GERD) Symptoms due to lack of Protonix  for a week, including pain and discomfort. - Prescribe Protonix  refill.  Chronic testicular pain Managed by urology. Previous nerve block provided temporary relief, indicating potential for surgical intervention. Surgery postponed due to scheduling conflicts. - Await rescheduling of surgery for nerve ablation.  Chronic back spasm Persistent spasms managed with stretches and core exercises, though exercises exacerbate testicular pain.  Hyperlipidemia Managed with cholesterol medication. No muscle aches or pains reported. - Order labs to assess cholesterol levels.  Vitamin D  deficiency Deficiency noted. Avoids sun exposure due to headaches and uses sunscreen. Supplementation contraindicated due to sarcoidosis and kidney stones. - Order labs to assess vitamin D  levels.  Prediabetes Previously noted. Reports dietary improvements including increased water  intake and consumption of oat-based breakfasts and green shakes. - Order labs to reassess A1c levels.  Continue all other maintenance medications.  Follow up plan: Return in about 6 months (around 08/22/2024), or if symptoms worsen or fail to improve, for Annual Physical.   Continue healthy lifestyle  choices, including diet (rich in fruits, vegetables, and lean proteins, and low in salt and simple carbohydrates) and exercise (at least 30 minutes of moderate physical activity daily).  Educational handout given for GERD  The above assessment and management plan was discussed with the patient. The patient verbalized understanding of and has agreed to the management plan. Patient is aware to call the clinic if they develop any new symptoms or if symptoms persist or worsen. Patient is aware when to return to the clinic for a follow-up visit. Patient educated on when it is appropriate to go to the emergency department.   Rosaline Bruns, FNP-C [facility] [phone]

## 2024-02-23 NOTE — Progress Notes (Unsigned)
 Psychiatric Initial Adult Assessment  Patient Identification: Henry Webb MRN:  [id number] Date of Evaluation:  02/23/2024 Referral Source: Severa Rock HERO, FNP  Assessment:  Henry Webb is a 53 y.o. male with a psychiatric history of GAD, PTSD, depression who presents in person to [facility] for initial evaluation of depression and  PTSD related symptoms.  PMHx is significant for sarcoidosis, prediabetes, GERD, vitamin D  deficiency, HLD, CAD, nuclear sclerosis  Patient reports ***  The patient's presentation is most consistent with a principal diagnosis of ***, as evidenced by ***.   Relevant Chart Review Notes/Encounters: *** Labs/Imaging: ***   Risk Assessment: A suicide and violence risk assessment was performed as part of this evaluation. There patient is deemed to be at chronic elevated risk for self-harm/suicide given the following factors: {SABSUICIDERISKFACTORS:29780}. These risk factors are mitigated by the following factors: {SABSUICIDEPROTECTIVEFACTORS:29779}. The patient is deemed to be at chronic elevated risk for violence given the following factors: {SABVIOLENCERISKFACTORS:29781}. These risk factors are mitigated by the following factors: {SABVIOLENCEPROTECTIVEFACTORS:29782}. There is no *** acute risk for suicide or violence at this time. The patient was educated about relevant modifiable risk factors including following recommendations for treatment of psychiatric illness and abstaining from substance abuse.  While future psychiatric events cannot be accurately predicted, the patient does not *** currently require  acute inpatient psychiatric care and does not *** currently meet [place]  involuntary commitment criteria.    Plan:  # PTSD Past medication trials: sertraline , venlefaxine Status of problem: new to me Interventions: -- ***zoloft  -- ***venlafaxine  -- ***counseling with [name]  # *** Past medication  trials:  Status of problem: new to me Interventions: -- ***  # *** Past medication trials:  Status of problem: new to me Interventions: -- ***  Health Maintenance PCP: Severa Rock HERO, FNP @ No data recorded  Patient was given contact information for behavioral health clinic and was instructed to call 911 for emergencies.  [name] will be discussed with the Attending MDwho agrees with the above statement and plan.   Subjective:  Chief Complaint: No chief complaint on file.   History of Present Illness:   *** The patient is here for ***. The patient reports a *** history of ***.  The patient's stressors include ***.   Sleep: *** Snoring: *** Caffeine: ***   Psychiatric ROS Depression: *** The patient denied any current symptoms of depression/***The patient reports a *** history of low mood and anhedonia, characterized by {Depression:32915}. Suicidal thoughts are ***. The patient denies any symptoms of ***.   Anxiety: *** The patient denied any current symptoms of anxiety/ ***The patient reports a *** history of excessive anxiety and worry about a number of events, characterized by {HJI:67083}.   Panic attacks: *** The patient denies any current or past history of panic attacks/*** The patient reports experiencing of intense surge of fear, in which the following symptoms develop: {Panic Attacks:32917}  Mania/Hypomania: *** Negative for any past or current symptoms of expansive energy or mood/ The patient reports a *** history of episodes of expansive energy and mood that can last up to *** days characterized by {MANIA:32922}    Last episode reported by patient: ***   PSTD: *** Negative for any history of trauma / ***The patient reports past exposure to ***.  Intrusions s/xs: ***  Hyperarousal s/xs: ***  Avoidance s/xs: ***  Negative effects on cognition and mood: ***  Eating Disorder: The patient denies any current  or past history of disordered eating  that includes restrictive, binging, purging behaviors, or any other compensatory behaviors / ***   IED: ***/ The patient reports a ** history of impulsive aggression characterized by {PZI:67078}  Psychosis: *** The patient denies any history of hallucinations, disorganized thinking, paranoia, or delusions. / ***    Safety: Active SI: ***Denied Passive SI: *** Access to firearms: *** Psychosis: as above  ROS   Past Psychiatric History:  Diagnoses: *** Medication trials: *** Previous psychiatrist/therapist: *** Hospitalizations: *** Suicide attempts: *** NSSIB Hx: *** Hx of violence towards others: *** Hx of trauma/abuse: ***  Substance Abuse History: Alcohol:  Hx of withdrawal seizures/Dts: *** Tobacco: *** Cannabis: *** Opioids: history of opioid abuse (OxyContin ) from 2008 to 2014, with sustained sobriety since that time  Other Illicit Substance Use: IVDU: denied Detox Hx: *** Rehab Hx: ***  Past Medical History PCP: Severa Rock HERO, FNP  Dx: *** ALL: *** Calcium -containing compounds and Nsaids  Head trauma: *** Seizures: ***   Family Psychiatric History: *** Psychiatric disorders: *** Suicide hx: *** Homicide: ***  Social History: Living situation: *** Occupational status: Retired Photographer history: *** Marital Status: Divorced in 2004, separated from current spouse Children: 3 chidlren  Legal Hx: *** DUI/DWI: *** Military Hx: history of service in the [employer] and was medically discharged.  Developmental Hx: School Performance: Upbringing/Relationship with parents: Major Family stressors:  Past Medical History:  Past Medical History:  Diagnosis Date  . Anxiety   . Arthritis   . CAD (coronary artery disease)   . Depression   . Fibromyalgia   . GERD (gastroesophageal reflux disease)   . History of gout   . Kidney stones   . Migraine   . Morbid obesity (HCC)   . Palpitations   . Sarcoidosis   . Tachycardia   . Testicular  hypofunction     Past Surgical History:  Procedure Laterality Date  . COLONOSCOPY WITH PROPOFOL  N/A 07/02/2022   Procedure: COLONOSCOPY WITH PROPOFOL ;  Surgeon: Shaaron Lamar HERO, MD;  [facility];  Service: Endoscopy;  Laterality: N/A;  9:15 am  . CORONARY PRESSURE/FFR STUDY N/A 07/31/2020   Procedure: INTRAVASCULAR PRESSURE WIRE/FFR STUDY;  Surgeon: Swaziland, Peter M, MD;  Location: [facility] INVASIVE CV LAB;  Service: Cardiovascular;  Laterality: N/A;  . CORONARY STENT INTERVENTION N/A 07/31/2020   Procedure: CORONARY STENT INTERVENTION;  Surgeon: Swaziland, Peter M, MD;  Location: [facility] INVASIVE CV LAB;  Service: Cardiovascular;  Laterality: N/A;  . ESOPHAGOGASTRODUODENOSCOPY (EGD) WITH PROPOFOL  N/A 07/02/2022   Procedure: ESOPHAGOGASTRODUODENOSCOPY (EGD) WITH PROPOFOL ;  Surgeon: Shaaron Lamar HERO, MD;  [facility];  Service: Endoscopy;  Laterality: N/A;  . Fusion  2015   c 3-5- cevical fusion  . LEFT HEART CATH AND CORONARY ANGIOGRAPHY N/A 07/31/2020   Procedure: LEFT HEART CATH AND CORONARY ANGIOGRAPHY;  Surgeon: Swaziland, Peter M, MD;  Location: [facility] INVASIVE CV LAB;  Service: Cardiovascular;  Laterality: N/A;  . LITHOTRIPSY    . MALONEY DILATION N/A 07/02/2022   Procedure: AGAPITO DILATION;  Surgeon: Shaaron Lamar HERO, MD;  [facility];  Service: Endoscopy;  Laterality: N/A;  . POLYPECTOMY  07/02/2022   Procedure: POLYPECTOMY;  Surgeon: Shaaron Lamar HERO, MD;  [facility];  Service: Endoscopy;;    Family History:  Family History  Problem Relation Age of Onset  . Depression Mother   . Ovarian cancer Mother   . Rheum arthritis Mother   . Depression Father   .  Heart attack Father   . Hypertension Father   . Ehlers-Danlos syndrome Sister   . Depression Daughter   . Headache Daughter   . Colon cancer Cousin 75    Social History:   Social History   Socioeconomic History  . Marital status: Married    Spouse name: Not on file  . Number of children: 3   . Years of education: Not on file  . Highest education level: Some college, no degree  Occupational History  . Occupation: Retired    Comment: Previously in [employer] x 22 years  Tobacco Use  . Smoking status: Former    Current packs/day: 0.00    Types: Cigarettes    Quit date: 1996    Years since quitting: 29.7  . Smokeless tobacco: Current    Types: Snuff  Vaping Use  . Vaping status: Never Used  Substance and Sexual Activity  . Alcohol use: Yes    Comment: rare - beer  . Drug use: Yes    Types: Marijuana    Comment: use daily- smoke a bowl 5 x daily  . Sexual activity: Yes  Other Topics Concern  . Not on file  Social History Narrative  . Not on file   Social Drivers of Health   Financial Resource Strain: Low Risk  (12/01/2023)   Overall Financial Resource Strain (CARDIA)   . Difficulty of Paying Living Expenses: Not hard at all  Food Insecurity: No Food Insecurity (12/01/2023)   Hunger Vital Sign   . Worried About Programme researcher, broadcasting/film/video in the Last Year: Never true   . Ran Out of Food in the Last Year: Never true  Transportation Needs: No Transportation Needs (12/01/2023)   PRAPARE - Transportation   . Lack of Transportation (Medical): No   . Lack of Transportation (Non-Medical): No  Physical Activity: Sufficiently Active (12/01/2023)   Exercise Vital Sign   . Days of Exercise per Week: 7 days   . Minutes of Exercise per Session: 60 min  Stress: No Stress Concern Present (12/01/2023)   [employer] of Occupational Health - Occupational Stress Questionnaire   . Feeling of Stress: Only a little  Social Connections: Socially Isolated (12/01/2023)   Social Connection and Isolation Panel   . Frequency of Communication with Friends and Family: Three times a week   . Frequency of Social Gatherings with Friends and Family: Never   . Attends Religious Services: Never   . Active Member of Clubs or Organizations: No   . Attends Banker Meetings: Not on file   .  Marital Status: Separated    Allergies:   Allergies  Allergen Reactions  . Calcium -Containing Compounds     Flares sarcoidosis   . Nsaids Other (See Comments)    High blood pressure    Current Medications: Current Outpatient Medications  Medication Sig Dispense Refill  . amLODipine  (NORVASC ) 5 MG tablet TAKE 1 TABLET(5 MG) BY MOUTH DAILY 90 tablet 3  . aspirin  EC 81 MG tablet Take 1 tablet (81 mg total) by mouth daily. Swallow whole. 90 tablet 3  . cyclobenzaprine  (FLEXERIL ) 10 MG tablet Take 1 tablet (10 mg total) by mouth 2 (two) times daily as needed for muscle spasms. 20 tablet 0  . isosorbide  mononitrate (IMDUR ) 30 MG 24 hr tablet TAKE 1 TABLET(30 MG) BY MOUTH DAILY 90 tablet 3  . nitroGLYCERIN  (NITROSTAT ) 0.4 MG SL tablet PLACE 1 TABLET UNDER THE TONGUE AS NEEDED FOR CHEST PAIN. MAY REPEAT IN  5 MINUTES IF SYMPTOMS STILL PRESENT. MAX 3 DOSES IN 15 MINUTES. 25 tablet 6  . nystatin (MYCOSTATIN) 100000 UNIT/ML suspension Take 5 mLs by mouth 4 (four) times daily as needed.    . pantoprazole  (PROTONIX ) 40 MG tablet Take 1 tablet (40 mg total) by mouth daily. 90 tablet 3  . propranolol  ER (INDERAL  LA) 160 MG SR capsule TAKE 1 CAPSULE(160 MG) BY MOUTH DAILY 90 capsule 2  . rosuvastatin  (CRESTOR ) 40 MG tablet TAKE 1 TABLET BY MOUTH EVERY DAY 90 tablet 3  . sertraline  (ZOLOFT ) 100 MG tablet Take 1 tablet (100 mg total) by mouth daily. 30 tablet 0  . Venlafaxine  HCl 225 MG TB24 Take 1 tablet (225 mg total) by mouth daily at 6 (six) AM. 30 tablet 0   No current facility-administered medications for this visit.     Objective: Psychiatric Specialty Exam: General Appearance: Casual, fairly groomed  Eye Contact:  Good    Speech:  Clear, coherent, normal rate, spontaneous  Volume:  Normal   Mood:  see above  Affect:  Appropriate, congruent, full range  Thought Content: Logical, rumination  ***  Suicidal Thoughts: see subjective  Thought Process:  Coherent, goal-directed, circumstantial  ***  Orientation:  A&Ox4   Memory:  Immediate good  Judgment:  Fair   Insight:  Fair***  Concentration:  Attention and concentration good   Recall:  Good  Fund of Knowledge: Good  Language: Good, fluent  Psychomotor Activity: Normal  Akathisia:  NA   AIMS (if indicated): NA   Assets:   {Assets (PAA):22698}  ADL's:  Intact  Cognition: WNL  Sleep: see above  Appetite: see above    Physical Exam    Metabolic Disorder Labs: Lab Results  Component Value Date   HGBA1C 5.5 02/23/2024   No results found for: PROLACTIN Lab Results  Component Value Date   CHOL WILL FOLLOW 02/23/2024   TRIG WILL FOLLOW 02/23/2024   HDL WILL FOLLOW 02/23/2024   CHOLHDL WILL FOLLOW 02/23/2024   LDLCALC WILL FOLLOW 02/23/2024   LDLCALC 85 03/17/2023   Lab Results  Component Value Date   TSH WILL FOLLOW 02/23/2024    Therapeutic Level Labs: No results found for: LITHIUM No results found for: CBMZ No results found for: VALPROATE   Marlo Masson, MD 9/30/20259:22 PM

## 2024-02-23 NOTE — Progress Notes (Signed)
 Clinical Summary Henry Webb is a 53 y.o.male seen today for follow up of the following medical problems.     1. Palpitations - symptoms on and off for several years - 04/2019 episodes 1% PVC burden. 02/2019 [facility] PET scan: no evidence of cardiac sarcoid   - 12/2021 monitor: rare ectopy -no recent palpitations.        2. CAD - 02/2019 PET stress at [facility], some coronary calcifications, no ischemia. Of note was not having chest pains at that time, test ordered to evaluate for cardiac sarcoid which was negative.  -04/2020 GXT: no ST/T changes, [facility] treadmill score 2.5  -Jan 2022 nuclear stress: inferolateral infarct with moderate ischemia  - 07/2020 cath: LM patent, LAD distal 75%, D1 99%, D2 70%, ramus 99%, LCX mid 99%, OM1 70%, RCA 25%, RPDA 90%. Received DES to LCX and OM. Other disease small vessel or diffuse distal disease  - some chest pains - midchest/epigastric, pressure/heartburn like feeling. Typically after eating, particularly spicy foods. Also occurs if pills gets stuck - walking 1.5 miles every morning, inclines. No exertional symptoms. Does heavy yardworks without issues.     3. Sarcoid - involvement of eye, lungs, liver, kidney, lymph nodes, and neuro - followed by rheumatology     4. White coat HTN - compliant with meds   - home bp's are usually      5. Syncope - isolated episode, thought to be vasovagal - no recurrent episodes    6. Hematuria/recurrent kidney stones - followed by urology       7. Hyperlipidemia  - recent labs with pcp 04/2021 : 04/2021 TC 159 TG 133 HDL 38 LDL 97    Jan 2024: TC 135 TG 85 HDL 42 LDL 77 - discussed zetia, wants to work on diet.    02/2023 TC 156 TG 197 HDL 37 LDL 85 - has made dietary changes since last labs.  - lipids drawn today    8. Preoperative evaluation - considering right variocelectomy under general anesthesia   Just had new grandson by his daughter who lives in [place] , just turned 6 months.   Past Medical History:  Diagnosis Date   Anxiety    Arthritis    CAD (coronary artery disease)    Depression    Fibromyalgia    GERD (gastroesophageal reflux disease)    History of gout    Kidney stones    Migraine    Morbid obesity (HCC)    Palpitations    Sarcoidosis    Tachycardia    Testicular hypofunction      Allergies  Allergen Reactions   Calcium -Containing Compounds     Flares sarcoidosis    Nsaids Other (See Comments)    High blood pressure     Current Outpatient Medications  Medication Sig Dispense Refill   amLODipine  (NORVASC ) 5 MG tablet TAKE 1 TABLET(5 MG) BY MOUTH DAILY 90 tablet 3   aspirin  EC 81 MG tablet Take 1 tablet (81 mg total) by mouth daily. Swallow whole. 90 tablet 3   cyclobenzaprine  (FLEXERIL ) 10 MG tablet Take 1 tablet (10 mg total) by mouth 2 (two) times daily as needed for muscle spasms. 20 tablet 0   isosorbide  mononitrate (IMDUR ) 30 MG 24 hr tablet TAKE 1 TABLET(30 MG) BY MOUTH DAILY 90 tablet 3   methylPREDNISolone  (MEDROL  DOSEPAK) 4 MG TBPK tablet See dosepack instructions 21 each 0   nitroGLYCERIN  (NITROSTAT ) 0.4 MG SL tablet PLACE 1 TABLET UNDER THE TONGUE AS  NEEDED FOR CHEST PAIN. MAY REPEAT IN 5 MINUTES IF SYMPTOMS STILL PRESENT. MAX 3 DOSES IN 15 MINUTES. 25 tablet 6   nystatin (MYCOSTATIN) 100000 UNIT/ML suspension Take 5 mLs by mouth 4 (four) times daily as needed.     pantoprazole  (PROTONIX ) 40 MG tablet Take 1 tablet (40 mg total) by mouth daily. 90 tablet 3   propranolol  ER (INDERAL  LA) 160 MG SR capsule TAKE 1 CAPSULE(160 MG) BY MOUTH DAILY 90 capsule 2   rosuvastatin  (CRESTOR ) 40 MG tablet TAKE 1 TABLET BY MOUTH EVERY DAY 90 tablet 3   sertraline  (ZOLOFT ) 100 MG tablet Take 1 tablet (100 mg total) by mouth daily. 30 tablet 0   Venlafaxine  HCl 225 MG TB24 Take 1 tablet (225 mg total) by mouth daily at 6 (six) AM. 30 tablet 0   No current facility-administered medications for this visit.     Past Surgical History:  Procedure  Laterality Date   COLONOSCOPY WITH PROPOFOL  N/A 07/02/2022   Procedure: COLONOSCOPY WITH PROPOFOL ;  Surgeon: Shaaron Lamar HERO, MD;  [facility];  Service: Endoscopy;  Laterality: N/A;  9:15 am   CORONARY PRESSURE/FFR STUDY N/A 07/31/2020   Procedure: INTRAVASCULAR PRESSURE WIRE/FFR STUDY;  Surgeon: Swaziland, Peter M, MD;  Location: [facility] INVASIVE CV LAB;  Service: Cardiovascular;  Laterality: N/A;   CORONARY STENT INTERVENTION N/A 07/31/2020   Procedure: CORONARY STENT INTERVENTION;  Surgeon: Swaziland, Peter M, MD;  Location: [facility] INVASIVE CV LAB;  Service: Cardiovascular;  Laterality: N/A;   ESOPHAGOGASTRODUODENOSCOPY (EGD) WITH PROPOFOL  N/A 07/02/2022   Procedure: ESOPHAGOGASTRODUODENOSCOPY (EGD) WITH PROPOFOL ;  Surgeon: Shaaron Lamar HERO, MD;  [facility];  Service: Endoscopy;  Laterality: N/A;   Fusion  2015   c 3-5- cevical fusion   LEFT HEART CATH AND CORONARY ANGIOGRAPHY N/A 07/31/2020   Procedure: LEFT HEART CATH AND CORONARY ANGIOGRAPHY;  Surgeon: Swaziland, Peter M, MD;  Location: [facility] INVASIVE CV LAB;  Service: Cardiovascular;  Laterality: N/A;   LITHOTRIPSY     MALONEY DILATION N/A 07/02/2022   Procedure: AGAPITO DILATION;  Surgeon: Shaaron Lamar HERO, MD;  [facility];  Service: Endoscopy;  Laterality: N/A;   POLYPECTOMY  07/02/2022   Procedure: POLYPECTOMY;  Surgeon: Shaaron Lamar HERO, MD;  [facility];  Service: Endoscopy;;     Allergies  Allergen Reactions   Calcium -Containing Compounds     Flares sarcoidosis    Nsaids Other (See Comments)    High blood pressure      Family History  Problem Relation Age of Onset   Depression Mother    Ovarian cancer Mother    Rheum arthritis Mother    Depression Father    Heart attack Father    Hypertension Father    Ehlers-Danlos syndrome Sister    Depression Daughter    Headache Daughter    Colon cancer Cousin 62     Social History Henry Webb reports that he quit smoking about 29 years ago. His  smoking use included cigarettes. His smokeless tobacco use includes snuff. Henry Webb reports current alcohol use.    Physical Examination Today's Vitals   02/23/24 1555  BP: 109/74  Pulse: 90  SpO2: 97%  Weight: 235 lb 9.6 oz (106.9 kg)  Height: 6' 1 (1.854 m)   Body mass index is 31.08 kg/m.  Gen: resting comfortably, no acute distress HEENT: no scleral icterus, pupils equal round and reactive, no palptable cervical adenopathy,  CV: RRR, no m/rg, no jvd Resp: Clear to auscultation  bilaterally GI: abdomen is soft, non-tender, non-distended, normal bowel sounds, no hepatosplenomegaly MSK: extremities are warm, no edema.  Skin: warm, no rash Neuro:  no focal deficits Psych: appropriate affect   Diagnostic Studies  04/2019 event monitor 72 hour monitor Min HR 76, Max HR 147, Avg HR 105 Rare supraventricular ectopy in the form of isolated PACs Occasional ventricular ectopy. 1% PVC burden. In the form of isolated PVCs, couplets, bigeminy, trigeminy. No NSVT or VT.     02/2019 Cardiac PET [facility] Impressions: - No evidence of cardiac sarcoidosis - Perfusion: Normal perfusion in the left ventricular myocardium on resting images. Metabolism: No abnormal FDG uptake noted.  - Global systolic function is mildly reduced. - Coronary calcifications are noted   Jan 2022 nuclear stress There was no ST segment deviation noted during stress. 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 This is an intermediate risk study. The left ventricular ejection fraction is normal (55-65%).       Assessment and Plan   1. PVCs/Palpitations - no recent symptoms, continue propranolol  - EKG today shows NSR     2. CAD - no recent symptoms, continue current meds   3. Hyperlipidemia - f/u labs, if LDL above 70 would add zetia 10mg  daily to his crestor  40mg    4. Preoperative evaluation - tolerates greater than 4 METs regularly  without exertional symptoms - ok to proceed with urological surgery, can hold aspirin  as needed      Dorn PHEBE Ross, M.D.

## 2024-02-23 NOTE — Patient Instructions (Signed)
 Medication Instructions:  Continue all current medications.   Labwork: none  Testing/Procedures: none  Follow-Up: 6 months   Any Other Special Instructions Will Be Listed Below (If Applicable).   If you need a refill on your cardiac medications before your next appointment, please call your pharmacy.

## 2024-02-24 ENCOUNTER — Ambulatory Visit: Payer: Self-pay | Admitting: Family Medicine

## 2024-02-24 ENCOUNTER — Ambulatory Visit (HOSPITAL_COMMUNITY): Admitting: Student in an Organized Health Care Education/Training Program

## 2024-02-24 ENCOUNTER — Encounter (HOSPITAL_COMMUNITY): Payer: Self-pay | Admitting: Student in an Organized Health Care Education/Training Program

## 2024-02-24 DIAGNOSIS — F411 Generalized anxiety disorder: Secondary | ICD-10-CM

## 2024-02-24 DIAGNOSIS — F321 Major depressive disorder, single episode, moderate: Secondary | ICD-10-CM | POA: Diagnosis not present

## 2024-02-24 LAB — CBC WITH DIFFERENTIAL/PLATELET
Basophils Absolute: 0.1 x10E3/uL (ref 0.0–0.2)
Basos: 1 %
EOS (ABSOLUTE): 0.5 x10E3/uL — ABNORMAL HIGH (ref 0.0–0.4)
Eos: 5 %
Hematocrit: 44 % (ref 37.5–51.0)
Hemoglobin: 14.3 g/dL (ref 13.0–17.7)
Immature Grans (Abs): 0 x10E3/uL (ref 0.0–0.1)
Immature Granulocytes: 0 %
Lymphocytes Absolute: 2.4 x10E3/uL (ref 0.7–3.1)
Lymphs: 26 %
MCH: 29.5 pg (ref 26.6–33.0)
MCHC: 32.5 g/dL (ref 31.5–35.7)
MCV: 91 fL (ref 79–97)
Monocytes Absolute: 0.8 x10E3/uL (ref 0.1–0.9)
Monocytes: 9 %
Neutrophils Absolute: 5.2 x10E3/uL (ref 1.4–7.0)
Neutrophils: 59 %
Platelets: 383 x10E3/uL (ref 150–450)
RBC: 4.85 x10E6/uL (ref 4.14–5.80)
RDW: 12.6 % (ref 11.6–15.4)
WBC: 8.9 x10E3/uL (ref 3.4–10.8)

## 2024-02-24 LAB — CMP14+EGFR
ALT: 24 IU/L (ref 0–44)
AST: 21 IU/L (ref 0–40)
Albumin: 4.5 g/dL (ref 3.8–4.9)
Alkaline Phosphatase: 88 IU/L (ref 47–123)
BUN/Creatinine Ratio: 18 (ref 9–20)
BUN: 15 mg/dL (ref 6–24)
Bilirubin Total: 0.4 mg/dL (ref 0.0–1.2)
CO2: 25 mmol/L (ref 20–29)
Calcium: 9.9 mg/dL (ref 8.7–10.2)
Chloride: 100 mmol/L (ref 96–106)
Creatinine, Ser: 0.84 mg/dL (ref 0.76–1.27)
Globulin, Total: 2.3 g/dL (ref 1.5–4.5)
Glucose: 93 mg/dL (ref 70–99)
Potassium: 5.4 mmol/L — AB (ref 3.5–5.2)
Sodium: 139 mmol/L (ref 134–144)
Total Protein: 6.8 g/dL (ref 6.0–8.5)
eGFR: 105 mL/min/1.73 (ref >59)

## 2024-02-24 LAB — LIPID PANEL
Cholesterol, Total: 134 mg/dL (ref 100–199)
HDL: 35 mg/dL — AB (ref >39)
LDL CALC COMMENT:: 3.8 ratio (ref 0.0–5.0)
LDL Chol Calc (NIH): 65 mg/dL (ref 0–99)
Triglycerides: 208 mg/dL — AB (ref 0–149)
VLDL Cholesterol Cal: 34 mg/dL (ref 5–40)

## 2024-02-24 LAB — T4, FREE: Free T4: 0.95 ng/dL (ref 0.82–1.77)

## 2024-02-24 LAB — TSH: TSH: 1.73 u[IU]/mL (ref 0.450–4.500)

## 2024-02-24 LAB — VITAMIN D 25 HYDROXY (VIT D DEFICIENCY, FRACTURES): Vit D, 25-Hydroxy: 18.9 ng/mL — AB (ref 30.0–100.0)

## 2024-02-24 MED ORDER — VENLAFAXINE HCL ER 225 MG PO TB24
1.0000 | ORAL_TABLET | Freq: Every day | ORAL | 0 refills | Status: DC
Start: 2024-02-24 — End: 2024-04-04

## 2024-02-24 MED ORDER — LAMOTRIGINE 100 MG PO TABS: 100.0000 mg | ORAL_TABLET | Freq: Every day | ORAL | 0 refills | Status: DC

## 2024-02-24 MED ORDER — LAMOTRIGINE 25 MG PO TABS: 25.0000 mg | ORAL_TABLET | Freq: Every day | ORAL | 0 refills | Status: DC

## 2024-02-24 NOTE — Patient Instructions (Addendum)
 Take your zoloft  at 50 mg for one week then stop    Why Titration is Important: Lamotrigine must be started at a low dose and increased slowly to reduce the risk of serious skin rashes.  How to Take Lamotrigine:  Follow the Dosing Schedule:  Take the medication exactly as prescribed. Do not skip doses or take extra doses. Typical Titration Schedule: (Your doctor may adjust this based on your needs. Use the schedule provided by your doctor if it is different.)  Weeks 1 and 2: Take 25 mg once daily. Weeks 3 and 4: Take 50 mg once daily. Week 5 and onward: Take 100 mg .  How to Take: Take with or without food. Swallow tablets whole with water . Take at the same time each day. Missed Dose: If you miss a dose, take it as soon as you remember. If it is almost time for your next dose, skip the missed dose. Do not double up. If you miss more than 5 days in a row, contact your doctor before restarting. Possible Side Effects:  Common: Headache, dizziness, blurred vision, nausea. Serious: Rash, fever, swollen glands, mouth sores, or flu-like symptoms. If you develop a rash or any of these symptoms, stop taking lamotrigine and contact your doctor immediately. Other Important Information:  Do not stop taking lamotrigine suddenly without medical advice. Inform all healthcare providers that you are taking lamotrigine.

## 2024-02-24 NOTE — Addendum Note (Signed)
 Addended by: CARVIN CROCK on: 02/24/2024 03:44 PM   Modules accepted: Level of Service

## 2024-02-24 NOTE — Addendum Note (Signed)
 Addended by: CARRION CARRERO, MARLO on: 02/24/2024 03:25 PM   Modules accepted: Orders

## 2024-03-15 ENCOUNTER — Encounter: Payer: Self-pay | Admitting: Family Medicine

## 2024-03-15 ENCOUNTER — Ambulatory Visit: Admitting: Professional Counselor

## 2024-03-24 ENCOUNTER — Other Ambulatory Visit: Payer: Self-pay | Admitting: Cardiology

## 2024-03-27 ENCOUNTER — Other Ambulatory Visit (HOSPITAL_COMMUNITY): Payer: Self-pay | Admitting: Student in an Organized Health Care Education/Training Program

## 2024-03-27 DIAGNOSIS — F321 Major depressive disorder, single episode, moderate: Secondary | ICD-10-CM

## 2024-03-28 ENCOUNTER — Telehealth: Payer: Self-pay | Admitting: Family Medicine

## 2024-03-28 ENCOUNTER — Encounter: Payer: Self-pay | Admitting: Family Medicine

## 2024-03-28 ENCOUNTER — Ambulatory Visit (HOSPITAL_COMMUNITY): Admitting: Student in an Organized Health Care Education/Training Program

## 2024-03-28 ENCOUNTER — Ambulatory Visit (INDEPENDENT_AMBULATORY_CARE_PROVIDER_SITE_OTHER): Admitting: Family Medicine

## 2024-03-28 VITALS — BP 96/62 | HR 78 | Temp 98.2°F | Ht 72.0 in | Wt 231.4 lb

## 2024-03-28 DIAGNOSIS — L02214 Cutaneous abscess of groin: Secondary | ICD-10-CM

## 2024-03-28 DIAGNOSIS — N492 Inflammatory disorders of scrotum: Secondary | ICD-10-CM

## 2024-03-28 NOTE — Telephone Encounter (Signed)
 Copied from CRM #[id number]. Topic: Clinical - Medical Advice >> Mar 28, 2024 11:38 AM Delon HERO wrote: Reason for CRM: Patient was seen today for Abscess of left groin - Patient would like to know can he shower?

## 2024-03-28 NOTE — Progress Notes (Signed)
   Acute Office Visit  Subjective:     Patient ID: Henry Webb, male    DOB: 1970/12/13, 53 y.o.   MRN: [id number]  Chief Complaint  Patient presents with   Abscess    HPI  History of Present Illness   Henry Webb is a 53 year old male who presents with a recurrent abscess following recent drainage and treatment.  Cutaneous abscess - Recurrent abscess following recent incision and drainage on October 31st in the emergency room - Abscess continues to drain small amount of yellow fluid, occasionally mixed with a small amount of blood - No fever - Packing has not remained in place well. He was unable to keep dressing intact due to hair, causing partial displacement - Currently using nonabsorbent gauze with a plastic coat to cover the area  Pain associated with abscess - Significant improvement in pain since drainage procedure - Current pain described as 'surface pain' or 'little skin pain'  Antibiotic therapy - Currently taking doxycycline, prescribed for ten days  Prior procedures - Previous attempt to inoculate the area in the ER was interrupted due to an emergency situation. Per patient report there were multiple pockets that ER provider tried to break up.       ROS As per HPI.      Objective:    BP 96/62   Pulse 78   Temp 98.2 F (36.8 C) (Temporal)   Ht 6' (1.829 m)   Wt 231 lb 6.4 oz (105 kg)   SpO2 96%   BMI 31.38 kg/m    Physical Exam Vitals and nursing note reviewed. Exam conducted with a chaperone present.  Constitutional:      General: He is not in acute distress.    Appearance: He is not ill-appearing, toxic-appearing or diaphoretic.  Pulmonary:     Effort: Pulmonary effort is normal. No respiratory distress.  Genitourinary:    Comments: Shallow pea size opening with surrounding induration measuring 2 cm x 1 cm to left testicle. No erythema, fluctuance, drainage, or warmth. Minimal tenderness.  Neurological:     General: No  focal deficit present.     Mental Status: He is alert and oriented to person, place, and time.  Psychiatric:        Mood and Affect: Mood normal.        Behavior: Behavior normal.     No results found for any visits on 03/28/24.      Assessment & Plan:   Shantel was seen today for abscess.  Diagnoses and all orders for this visit:  Abscess of scrotum -     Ambulatory referral to Urology  Other orders -     Cancel: Ambulatory referral to General Surgery   Assessment and Plan    Skin abscess, status post incision and drainage with persistent induration Abscess has nearly closed up despite packing. Persistent induration and drainage suggest ongoing infection. Referral to general surgeon for potential re-incision and drainage due to closed abscess. - Referred to urology for potential re-incision and drainage. - Continue doxycycline as prescribed. - Monitor for signs of worsening infection: fever, increased pain, swelling, redness. - Scheduled follow-up in one week if not seen by surgeon. Sooner for new new or worsening symptoms.   The patient indicates understanding of these issues and agrees with the plan.     Annabella CHRISTELLA Search, FNP

## 2024-03-28 NOTE — Telephone Encounter (Signed)
 Pt aware it is ok to shower but not to submerge the abscess in a tub and pt voiced understanding.

## 2024-03-29 ENCOUNTER — Ambulatory Visit: Admitting: Professional Counselor

## 2024-03-29 DIAGNOSIS — F321 Major depressive disorder, single episode, moderate: Secondary | ICD-10-CM

## 2024-03-31 ENCOUNTER — Encounter: Payer: Self-pay | Admitting: Family Medicine

## 2024-03-31 NOTE — Addendum Note (Signed)
 Addended by: JOESPH ANNABELLA HERO on: 03/31/2024 08:41 AM   Modules accepted: Orders

## 2024-03-31 NOTE — BH Specialist Note (Signed)
 Henry Webb Follow-up  MRN: [id number] NAME: Henry Webb Date: 03/31/24  Start time: Start Time: 1100 End time: Stop Time: 1200 Total time: Total Time in Minutes (Visit): 60 Call number: Visit Number: 4- Fourth Visit  Reason for call today:  Patient presents for a Collaborative Care follow-up after a couple of months since the last session. He reports doing extremely well overall, noting a significant improvement in mood. The patient shared that his physician identified he had been prescribed two antidepressants concurrently, which may have contributed to worsened depressive symptoms. After tapering off Zoloft  and initiating a new medication regimen, the patient experienced an immediate and sustained improvement in mood.  He reports feeling more positive and emotionally stable. He has been communicating more effectively with his daughter, and their relationship has shown meaningful improvement. He also described feeling capable and grounded when supporting his son through a recent challenge. Overall, he presents with bright affect, increased motivation, and improved daily functioning.  The plan is to continue bi-weekly sessions to reinforce coping strategies and maintain mood stability. The patient expressed appreciation for Henry Webb and remains engaged in treatment.   PHQ-9 Scores:     03/31/2024    1:55 PM 02/24/2024    8:58 AM 02/23/2024   10:30 AM 12/29/2023    1:37 PM 12/15/2023    1:11 PM  Depression screen PHQ 2/9  Decreased Interest 0 2 3 2 3   Down, Depressed, Hopeless 0 3 3 3 3   PHQ - 2 Score 0 5 6 5 6   Altered sleeping 1 0 0 1 1  Tired, decreased energy 0 0 0 1 1  Change in appetite 0 0 0 0 0  Feeling bad or failure about yourself  1 3 0 0 1  Trouble concentrating 0 3 1 2 2   Moving slowly or fidgety/restless 0 0 0 0 0  Suicidal thoughts 0 0 0 0 0  PHQ-9 Score 2 11  7  9  11    Difficult doing work/chores Somewhat difficult Somewhat difficult Somewhat  difficult Very difficult Somewhat difficult     Data saved with a previous flowsheet row definition   GAD-7 Scores:     03/31/2024    2:04 PM 02/24/2024    9:00 AM 02/23/2024   10:30 AM 12/29/2023    1:38 PM  GAD 7 : Generalized Anxiety Score  Nervous, Anxious, on Edge 0 0 0 1  Control/stop worrying 0 3 0 0  Worry too much - different things 0 3 0 0  Trouble relaxing 1 3 2 2   Restless 0 2 2 1   Easily annoyed or irritable 1 2 1 2   Afraid - awful might happen 0 0 0 0  Total GAD 7 Score 2 13 5 6   Anxiety Difficulty Somewhat difficult Somewhat difficult Somewhat difficult Very difficult    Stress Current stressors:  isolation Sleep:  Good Appetite:  Good Coping ability:  Good Patient taking medications as prescribed:  Yes  Current medications:  Outpatient Encounter Medications as of 03/29/2024  Medication Sig   amLODipine  (NORVASC ) 5 MG tablet TAKE 1 TABLET(5 MG) BY MOUTH DAILY   aspirin  EC 81 MG tablet Take 1 tablet (81 mg total) by mouth daily. Swallow whole.   cyclobenzaprine  (FLEXERIL ) 10 MG tablet Take 1 tablet (10 mg total) by mouth 2 (two) times daily as needed for muscle spasms.   doxycycline (VIBRAMYCIN) 100 MG capsule Take 100 mg by mouth.   isosorbide  mononitrate (IMDUR ) 30 MG 24 hr tablet  TAKE 1 TABLET(30 MG) BY MOUTH DAILY   lamoTRIgine (LAMICTAL) 100 MG tablet Take 1 tablet (100 mg total) by mouth daily.   lamoTRIgine (LAMICTAL) 25 MG tablet TAKE 1 TABLET BY MOUTH EVERY DAY FOR 14 DAYS THEN TAKE 2 TABLETS BY MOUTH EVERY DAY FOR 14 DAYS   nitroGLYCERIN  (NITROSTAT ) 0.4 MG SL tablet PLACE 1 TABLET UNDER THE TONGUE AS NEEDED FOR CHEST PAIN. MAY REPEAT IN 5 MINUTES IF SYMPTOMS STILL PRESENT. MAX 3 DOSES IN 15 MINUTES.   nystatin (MYCOSTATIN) 100000 UNIT/ML suspension Take 5 mLs by mouth 4 (four) times daily as needed.   pantoprazole  (PROTONIX ) 40 MG tablet Take 1 tablet (40 mg total) by mouth daily.   propranolol  ER (INDERAL  LA) 160 MG SR capsule TAKE 1 CAPSULE(160 MG) BY  MOUTH DAILY   rosuvastatin  (CRESTOR ) 40 MG tablet TAKE 1 TABLET BY MOUTH EVERY DAY   Venlafaxine  HCl 225 MG TB24 Take 1 tablet (225 mg total) by mouth daily at 6 (six) AM.   No facility-administered encounter medications on file as of 03/29/2024.     Self-harm Behaviors Risk Assessment Self-harm risk factors:   Patient endorses recent thoughts of harming self:    [place] Suicide Severity Rating Scale: Failed to redirect to the Timeline version of the REVFS SmartLink.   Danger to Others Risk Assessment Danger to others risk factors:   Patient endorses recent thoughts of harming others:    Dynamic Appraisal of Situational Aggression (DASA):      No data to display           Substance Use Assessment Patient recently consumed alcohol:  denies  Alcohol Use Disorder Identification Test (AUDIT):     07/09/2023   10:40 AM 12/01/2023    9:43 AM  Alcohol Use Disorder Test (AUDIT)  1. How often do you have a drink containing alcohol? 2 1  2. How many drinks containing alcohol do you have on a typical day when you are drinking? 0 0  3. How often do you have six or more drinks on one occasion? 0 0  AUDIT-C Score 2  1      Patient-reported    Henry Webb

## 2024-04-04 ENCOUNTER — Ambulatory Visit (HOSPITAL_COMMUNITY): Admitting: Student in an Organized Health Care Education/Training Program

## 2024-04-04 ENCOUNTER — Ambulatory Visit: Admitting: Family Medicine

## 2024-04-04 ENCOUNTER — Encounter (HOSPITAL_COMMUNITY): Payer: Self-pay | Admitting: Student in an Organized Health Care Education/Training Program

## 2024-04-04 VITALS — BP 106/67 | HR 76 | Ht 70.0 in | Wt 230.4 lb

## 2024-04-04 VITALS — BP 104/61 | HR 84 | Temp 98.8°F | Ht 70.0 in | Wt 235.0 lb

## 2024-04-04 DIAGNOSIS — Z6833 Body mass index (BMI) 33.0-33.9, adult: Secondary | ICD-10-CM

## 2024-04-04 DIAGNOSIS — N492 Inflammatory disorders of scrotum: Secondary | ICD-10-CM | POA: Diagnosis not present

## 2024-04-04 DIAGNOSIS — F321 Major depressive disorder, single episode, moderate: Secondary | ICD-10-CM | POA: Diagnosis not present

## 2024-04-04 DIAGNOSIS — E66811 Obesity, class 1: Secondary | ICD-10-CM

## 2024-04-04 DIAGNOSIS — F411 Generalized anxiety disorder: Secondary | ICD-10-CM | POA: Diagnosis not present

## 2024-04-04 DIAGNOSIS — G4700 Insomnia, unspecified: Secondary | ICD-10-CM | POA: Diagnosis not present

## 2024-04-04 DIAGNOSIS — R519 Headache, unspecified: Secondary | ICD-10-CM

## 2024-04-04 MED ORDER — RAMELTEON 8 MG PO TABS: 8.0000 mg | ORAL_TABLET | Freq: Every evening | ORAL | 0 refills | Status: DC | PRN

## 2024-04-04 MED ORDER — SULFAMETHOXAZOLE-TRIMETHOPRIM 800-160 MG PO TABS: 1.0000 | ORAL_TABLET | Freq: Two times a day (BID) | ORAL | 0 refills | Status: AC

## 2024-04-04 MED ORDER — VENLAFAXINE HCL ER 225 MG PO TB24: 1.0000 | ORAL_TABLET | Freq: Every day | ORAL | 0 refills | Status: DC

## 2024-04-04 MED ORDER — LAMOTRIGINE 100 MG PO TABS: 100.0000 mg | ORAL_TABLET | Freq: Every day | ORAL | 0 refills | Status: DC

## 2024-04-04 NOTE — Progress Notes (Signed)
   Acute Office Visit  Subjective:     Patient ID: Henry Webb, male    DOB: [birth year], 53 y.o.   MRN: [id number]  Chief Complaint  Patient presents with   Wound Check    Scrotal abscess    Wound Check    History of Present Illness   Henry Webb is a 53 year old male who presents with a recurrent abscess.  Abscess of left scrotum - Abscess initially increased in size after last visit but decreased in size last night; currently as large as when first lanced in ER - No pain associated with the abscess currently - No current drainage, area has closed - Completed a course of doxycycline with one dose remaining - No fever - Concern regarding impact of abscess on upcoming right testicular surgery scheduled for December 2nd - He is established with Dr. Roseann for a history of kidney stones      ROS As per HPI.      Objective:    BP 104/61   Pulse 84   Temp 98.8 F (37.1 C)   Ht 5' 10 (1.778 m)   Wt 235 lb (106.6 kg)   SpO2 95%   BMI 33.72 kg/m    Physical Exam Vitals and nursing note reviewed.  Constitutional:      General: He is not in acute distress.    Appearance: Normal appearance. He is not ill-appearing, toxic-appearing or diaphoretic.  Pulmonary:     Effort: Pulmonary effort is normal. No respiratory distress.  Genitourinary:    Penis: Normal and circumcised.      Comments: Quarter size area of induration to left scrotum. Opening now closed. No erythema, warmth, tenderness, exudate, or fluctuance. Musculoskeletal:     Right lower leg: No edema.     Left lower leg: No edema.  Neurological:     General: No focal deficit present.     Mental Status: He is alert and oriented to person, place, and time.  Psychiatric:        Mood and Affect: Mood normal.        Behavior: Behavior normal.     No results found for any visits on 04/04/24.      Assessment & Plan:   Riggins was seen today for wound check.  Diagnoses and all  orders for this visit:  Abscess of scrotum -     sulfamethoxazole-trimethoprim (BACTRIM DS) 800-160 MG tablet; Take 1 tablet by mouth 2 (two) times daily for 7 days.    Assessment and Plan    Scrotal abscess Abscess has now closed and increased in size, no significant pain, no drainage. Induration present, no fluctuance. Complete doxycycline, then start bactrim. Urgent urology referral has been placed and is currently pending.  - Finish last dose of doxycycline. - Start Bactrim BID for one week. - Contact Dr. Roseann to expedite referral. - Will reach out to referral coordinator for update on urgent urology referral.     Return to office for new or worsening symptoms, or if symptoms persist.   The patient indicates understanding of these issues and agrees with the plan.  Annabella CHRISTELLA Search, FNP
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
## 2024-04-11 ENCOUNTER — Ambulatory Visit (HOSPITAL_COMMUNITY): Admitting: Student in an Organized Health Care Education/Training Program

## 2024-04-12 ENCOUNTER — Ambulatory Visit: Admitting: Professional Counselor

## 2024-04-12 DIAGNOSIS — F321 Major depressive disorder, single episode, moderate: Secondary | ICD-10-CM | POA: Diagnosis not present

## 2024-04-12 NOTE — BH Specialist Note (Signed)
 [place] Follow-up  MRN: [id number] NAME: Henry Webb Date: 04/12/24  Start time: Start Time: 1030 End time: Stop Time: 1100 Total time: Total Time in Minutes (Visit): 30 Call number: Visit Number: 5-Fifth Visit  Reason for call today:  The patient is a 53 year old male who presented for a collaborative care follow-up session. He reports that his mood has dipped slightly since our last visit, primarily due to recent stress involving his son. His son was arrested and charged with a DUI, and the patient traveled to assist him and post bail. Although this situation has been emotionally taxing--leading to frustration, anger, and increased stress--he also shared that it felt meaningful to be able to support his son, as their relationship had been somewhat distant.  Despite this recent setback, the patient reports feeling generally more clear-headed, focused, and positive overall. He describes improved motivation and reconnection with important relationships, including communicating more with his mother and daughter. He notes that he still experiences occasional low moments, but these appear to be largely situational.  The patient also shared that he has been having intrusive memories related to his past with his ex-wife, who had been unfaithful in ways that were deeply traumatic for him. These memories resurfaced recently, and he wanted to process the emotional impact during today's session. He expressed that although the memories are painful, he is still functioning well and feels he is continuing to make progress.  Overall, the patient's symptoms appear to be stabilizing, with improvement in mood, clarity, and day-to-day functioning. He continues to demonstrate insight and engagement in his care. The plan is to begin meeting bi-weekly, with the expectation of transitioning to monthly sessions as he maintains his progress.  PHQ-9 Scores:     04/12/2024   10:37 AM 04/04/2024    3:38 PM  04/04/2024    9:55 AM 03/31/2024    1:55 PM 02/24/2024    8:58 AM  Depression screen PHQ 2/9  Decreased Interest 1 1 2  0 2  Down, Depressed, Hopeless 3 3 3  0 3  PHQ - 2 Score 4 4 5  0 5  Altered sleeping 3 1 1 1  0  Tired, decreased energy 0 1 1 0 0  Change in appetite 0 0 0 0 0  Feeling bad or failure about yourself  1 0 0 1 3  Trouble concentrating 1 2 3  0 3  Moving slowly or fidgety/restless 0 0 0 0 0  Suicidal thoughts 0 0 0 0 0  PHQ-9 Score 9 8 10 2 11    Difficult doing work/chores Somewhat difficult Somewhat difficult Somewhat difficult Somewhat difficult Somewhat difficult     Data saved with a previous flowsheet row definition   GAD-7 Scores:     04/12/2024   10:38 AM 04/04/2024    3:39 PM 04/04/2024    9:56 AM 03/31/2024    2:04 PM  GAD 7 : Generalized Anxiety Score  Nervous, Anxious, on Edge 0 0 0 0  Control/stop worrying 3 0 2 0  Worry too much - different things 2 3 2  0  Trouble relaxing 2 1 1 1   Restless 2 2 1  0  Easily annoyed or irritable 1 1 1 1   Afraid - awful might happen 0 0 0 0  Total GAD 7 Score 10 7 7 2   Anxiety Difficulty Somewhat difficult Somewhat difficult Somewhat difficult Somewhat difficult    Stress Current stressors:  son is locked up Sleep:  good Appetite:  good Coping ability:  fair Patient  taking medications as prescribed:  yes  Current medications:  Outpatient Encounter Medications as of 04/12/2024  Medication Sig   amLODipine  (NORVASC ) 5 MG tablet TAKE 1 TABLET(5 MG) BY MOUTH DAILY   aspirin  EC 81 MG tablet Take 1 tablet (81 mg total) by mouth daily. Swallow whole.   cyclobenzaprine  (FLEXERIL ) 10 MG tablet Take 1 tablet (10 mg total) by mouth 2 (two) times daily as needed for muscle spasms.   isosorbide  mononitrate (IMDUR ) 30 MG 24 hr tablet TAKE 1 TABLET(30 MG) BY MOUTH DAILY   lamoTRIgine  (LAMICTAL ) 100 MG tablet Take 1 tablet (100 mg total) by mouth daily.   nitroGLYCERIN  (NITROSTAT ) 0.4 MG SL tablet PLACE 1 TABLET UNDER THE  TONGUE AS NEEDED FOR CHEST PAIN. MAY REPEAT IN 5 MINUTES IF SYMPTOMS STILL PRESENT. MAX 3 DOSES IN 15 MINUTES.   nystatin (MYCOSTATIN) 100000 UNIT/ML suspension Take 5 mLs by mouth 4 (four) times daily as needed.   pantoprazole  (PROTONIX ) 40 MG tablet Take 1 tablet (40 mg total) by mouth daily.   propranolol  ER (INDERAL  LA) 160 MG SR capsule TAKE 1 CAPSULE(160 MG) BY MOUTH DAILY   ramelteon  (ROZEREM ) 8 MG tablet Take 1 tablet (8 mg total) by mouth at bedtime as needed for sleep (for difficulty initiating sleep).   rosuvastatin  (CRESTOR ) 40 MG tablet TAKE 1 TABLET BY MOUTH EVERY DAY   Venlafaxine  HCl 225 MG TB24 Take 1 tablet (225 mg total) by mouth daily at 6 (six) AM.   No facility-administered encounter medications on file as of 04/12/2024.     Self-harm Behaviors Risk Assessment Self-harm risk factors:  past thoughts  Patient endorses recent thoughts of harming self:  Denies   Danger to Others Risk Assessment Danger to others risk factors:  None Patient endorses recent thoughts of harming others:  Denies   Substance Use Assessment Patient recently consumed alcohol:  same  Alcohol Use Disorder Identification Test (AUDIT):     07/09/2023   10:40 AM 12/01/2023    9:43 AM 04/04/2024    9:33 AM  Alcohol Use Disorder Test (AUDIT)  1. How often do you have a drink containing alcohol? 2 1 1   2. How many drinks containing alcohol do you have on a typical day when you are drinking? 0 0 0  3. How often do you have six or more drinks on one occasion? 0 0 0  AUDIT-C Score 2  1  1       Patient-reported    Goals, Interventions and Follow-up Plan Goals: Increase healthy adjustment to current life circumstances Interventions: Behavioral Activation and CBT Cognitive Behavioral Therapy Follow-up Plan: 1 month    [name]

## 2024-04-13 ENCOUNTER — Encounter: Payer: Self-pay | Admitting: Urology

## 2024-04-13 ENCOUNTER — Ambulatory Visit: Admitting: Urology

## 2024-04-13 VITALS — BP 102/68 | HR 79 | Ht 70.0 in | Wt 236.0 lb

## 2024-04-13 DIAGNOSIS — N492 Inflammatory disorders of scrotum: Secondary | ICD-10-CM

## 2024-04-13 DIAGNOSIS — N5082 Scrotal pain: Secondary | ICD-10-CM | POA: Insufficient documentation

## 2024-04-13 DIAGNOSIS — N200 Calculus of kidney: Secondary | ICD-10-CM | POA: Diagnosis not present

## 2024-04-13 LAB — URINALYSIS, ROUTINE W REFLEX MICROSCOPIC
Glucose, UA: NEGATIVE
Leukocytes,UA: NEGATIVE
Nitrite, UA: NEGATIVE
Protein,UA: NEGATIVE
RBC, UA: NEGATIVE
Specific Gravity, UA: 1.025 (ref 1.005–1.030)
Urobilinogen, Ur: 1 mg/dL (ref 0.2–1.0)
pH, UA: 7 (ref 5.0–7.5)

## 2024-04-13 NOTE — Progress Notes (Signed)
 Assessment: 1. Scrotal abscess   2. Scrotal pain   3. Nephrolithiasis     Plan: I personally reviewed the patient's chart including provider notes. I do not think that incision and drainage is indicated at this time as there is no erythema or tenderness. Continue dietary modifications for stone prevention. Return to office in 6 months.  Chief Complaint:  Chief Complaint  Patient presents with   Scrotal abscess    History of Present Illness:  Henry Webb is a 53 y.o. year old male who is seen for evaluation of scrotal abscess. He was recently seen in the emergency room in [facility] for a left scrotal abscess.  He reports this began after a presumed insect bite.  I&D was performed.  He was treated with doxycycline and Bactrim . He completed the antibiotics.  He is not having any current pain or drainage from the area.  He does notice a firm area on the left side of the scrotum.  He is followed for right testicular pain and nephrolithiasis. He was seen in March 2025 for evaluation of right sided scrotal pain which had been present for greater than 3 years.  He reported this pain is similar to the symptoms he was having in 04-27-2022.  He reported fairly constant pain in the right scrotal area with radiation into the right groin.  No scrotal swelling or redness.  He was not taking anything for the symptoms at this time.  He also reported continued episodes of intermittent gross hematuria.  This last occurred several weeks prior to his visit.  No recent gross hematuria.  No dysuria or flank pain at the time of his visit.  He reported passing stones intermittently.  CT abdomen and pelvis without contrast from 2/25 showed no renal calculi, no renal mass or evidence of obstruction and no ureteral calculi. Scrotal ultrasound from 2/25 showed symmetrical heterogeneous echotexture of both testicles, 6 mm shadowing echogenic focus within the right inguinal canal likely calcification.  He was  previously seen in [place] for evaluation of a right renal calculus and gross hematuria.  At his initial visit in December 03, 2022he reported right lower quadrant pain with radiation to the right upper scrotum.  He has a long history of kidney stones with approximately 26 episodes of stones.  He has required ureteroscopic stone manipulation on several occasions as well as lithotripsy.  His last stone procedure was in 04/27/10.  He has passed multiple stones since that time.  He noted onset of gross hematuria while traveling in July 2022.  He had another episode in August 2022.  Recently, he has noted fairly constant gross hematuria.  Urinalysis from 04/02/2021 showed large blood.   He also has pain in the right lower quadrant with radiation to the right scrotum and testicle.  The pain symptoms have been more noticeable within the past several days.  No flank pain or dysuria.  No scrotal swelling or erythema.  He does report urinary frequency, nocturia, and occasional incontinence. IPSS = 7.  He is on Brilinta  following placement of a cardiac stent last year.  CT abdomen and pelvis with and without contrast from 04/17/2021 showed small bilateral nonobstructive renal calculi, no ureteral calculi or hydronephrosis. He presented to [facility] emergency room on 05/24/2021 with groin pain.  Urinalysis demonstrated >25 RBCs.  CT imaging showed a 3 mm calculus at the right UVJ as well as a 3 mm nonobstructing left renal calculus.  He passed the right ureteral calculus.  He  continued to have pain in the right lower quadrant with radiation to the right upper scrotum.  He was seen by Dr. Kallie and general surgery.  No inguinal hernia was appreciated.  He was treated with Levaquin  x10 days for possible epididymitis. He underwent evaluation with cystoscopy in January 2023.  He was found to have lateral lobe enlargement of the prostate but no other abnormalities.  At his visit in April 2025, he noted some improvement  in his scrotal pain with the meloxicam .  Unfortunately, he had GI upset with the medication.  Also, his cardiologist Dr. Alvan did not recommend an extended course of nonsteroidals.  After stopping the medication he had recurrence of his right scrotal pain.  His lower urinary tract symptoms were stable with some urgency, frequency, and nocturia.  No dysuria or gross hematuria. IPSS = 7/3. He was referred to Dr. Lovie at [facility] for his chronic orchialgia. He underwent a spermatic cord block with relief of his symptoms.  He is scheduled for spermatic cord denervation and varicocelectomy.  24-hour urine study from May 2025 showed elevated urine calcium  and oxalate.  Dietary modification recommended.  No recent stone symptoms.  Portions of the above documentation were copied from a prior visit for review purposes only.   Past Medical History:  Past Medical History:  Diagnosis Date   Anxiety    Arthritis    Asthma 2011   CAD (coronary artery disease)    Depression    Fibromyalgia    GERD (gastroesophageal reflux disease)    Glaucoma 1996   History of gout    Hypertension 2004   Kidney stones    Migraine    Morbid obesity (HCC)    Palpitations    Sarcoidosis    Sleep apnea 2011   Tachycardia    Testicular hypofunction    Ulcer 1998    Past Surgical History:  Past Surgical History:  Procedure Laterality Date   COLONOSCOPY WITH PROPOFOL  N/A 07/02/2022   Procedure: COLONOSCOPY WITH PROPOFOL ;  Surgeon: Shaaron Lamar HERO, MD;  [facility];  Service: Endoscopy;  Laterality: N/A;  9:15 am   CORONARY PRESSURE/FFR STUDY N/A 07/31/2020   Procedure: INTRAVASCULAR PRESSURE WIRE/FFR STUDY;  Surgeon: Jordan, Peter M, MD;  Location: [facility] INVASIVE CV LAB;  Service: Cardiovascular;  Laterality: N/A;   CORONARY STENT INTERVENTION N/A 07/31/2020   Procedure: CORONARY STENT INTERVENTION;  Surgeon: Jordan, Peter M, MD;  Location: [facility] INVASIVE CV LAB;  Service: Cardiovascular;   Laterality: N/A;   ESOPHAGOGASTRODUODENOSCOPY (EGD) WITH PROPOFOL  N/A 07/02/2022   Procedure: ESOPHAGOGASTRODUODENOSCOPY (EGD) WITH PROPOFOL ;  Surgeon: Shaaron Lamar HERO, MD;  [facility];  Service: Endoscopy;  Laterality: N/A;   Fusion  2015   c 3-5- cevical fusion   LEFT HEART CATH AND CORONARY ANGIOGRAPHY N/A 07/31/2020   Procedure: LEFT HEART CATH AND CORONARY ANGIOGRAPHY;  Surgeon: Jordan, Peter M, MD;  Location: [facility] INVASIVE CV LAB;  Service: Cardiovascular;  Laterality: N/A;   LITHOTRIPSY     MALONEY DILATION N/A 07/02/2022   Procedure: AGAPITO DILATION;  Surgeon: Shaaron Lamar HERO, MD;  [facility];  Service: Endoscopy;  Laterality: N/A;   POLYPECTOMY  07/02/2022   Procedure: POLYPECTOMY;  Surgeon: Shaaron Lamar HERO, MD;  [facility];  Service: Endoscopy;;   SPINE SURGERY  2015    Allergies:  Allergies  Allergen Reactions   Calcium -Containing Compounds     Flares sarcoidosis    Nsaids Other (See Comments)  High blood pressure    Family History:  Family History  Problem Relation Age of Onset   Depression Mother    Ovarian cancer Mother    Rheum arthritis Mother    Anxiety disorder Mother    Arthritis Mother    Cancer Mother    Vision loss Mother    Depression Father    Heart attack Father    Hypertension Father    Alcohol abuse Father    Arthritis Father    Ehlers-Danlos syndrome Sister    Anxiety disorder Sister    Depression Daughter    Headache Daughter    Anxiety disorder Daughter    Colon cancer Cousin 81    Social History:  Social History   Tobacco Use   Smoking status: Former    Current packs/day: 0.00    Types: Cigarettes    Quit date: 1996    Years since quitting: 29.9   Smokeless tobacco: Current    Types: Snuff  Vaping Use   Vaping status: Never Used  Substance Use Topics   Alcohol use: Yes    Comment: rare - beer   Drug use: Yes    Types: Marijuana    Comment: use daily- smoke a bowl 5 x daily     ROS: Constitutional:  Negative for fever, chills, weight loss CV: Negative for chest pain, previous MI, hypertension Respiratory:  Negative for shortness of breath, wheezing, sleep apnea, frequent cough GI:  Negative for nausea, vomiting, bloody stool, GERD  Physical exam: BP 102/68   Pulse 79   Ht 5' 10 (1.778 m)   Wt 236 lb (107 kg)   BMI 33.86 kg/m  GENERAL APPEARANCE:  Well appearing, well developed, well nourished, NAD HEENT:  Atraumatic, normocephalic, oropharynx clear NECK:  Supple without lymphadenopathy or thyromegaly ABDOMEN:  Soft, non-tender, no masses EXTREMITIES:  Moves all extremities well, without clubbing, cyanosis, or edema NEUROLOGIC:  Alert and oriented x 3, normal gait, CN II-XII grossly intact MENTAL STATUS:  appropriate BACK:  Non-tender to palpation, No CVAT SKIN:  Warm, dry, and intact GU: Scrotum: small area of induration palpated on left upper scrotum; nontender; no fluctuance; no erythema   Results: U/A: Negative

## 2024-04-27 ENCOUNTER — Other Ambulatory Visit: Payer: Self-pay | Admitting: Cardiology

## 2024-05-09 ENCOUNTER — Encounter (HOSPITAL_COMMUNITY): Payer: Self-pay | Admitting: Student in an Organized Health Care Education/Training Program

## 2024-05-09 ENCOUNTER — Ambulatory Visit (HOSPITAL_COMMUNITY): Admitting: Student in an Organized Health Care Education/Training Program

## 2024-05-09 DIAGNOSIS — F411 Generalized anxiety disorder: Secondary | ICD-10-CM | POA: Diagnosis not present

## 2024-05-09 DIAGNOSIS — G4700 Insomnia, unspecified: Secondary | ICD-10-CM | POA: Diagnosis not present

## 2024-05-09 DIAGNOSIS — F321 Major depressive disorder, single episode, moderate: Secondary | ICD-10-CM | POA: Diagnosis not present

## 2024-05-09 MED ORDER — VENLAFAXINE HCL ER 225 MG PO TB24: 1.0000 | ORAL_TABLET | Freq: Every day | ORAL | 0 refills | Status: AC

## 2024-05-09 MED ORDER — TRAZODONE HCL 50 MG PO TABS: 50.0000 mg | ORAL_TABLET | Freq: Every day | ORAL | 2 refills | Status: AC

## 2024-05-09 MED ORDER — RAMELTEON 8 MG PO TABS: 8.0000 mg | ORAL_TABLET | Freq: Every evening | ORAL | 0 refills | Status: AC | PRN

## 2024-05-09 MED ORDER — LAMOTRIGINE 100 MG PO TABS: 100.0000 mg | ORAL_TABLET | Freq: Every day | ORAL | 0 refills | Status: AC
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
## 2024-05-10 NOTE — BH Specialist Note (Signed)
 No show

## 2024-05-20 ENCOUNTER — Other Ambulatory Visit: Payer: Self-pay | Admitting: Cardiology

## 2024-06-06 ENCOUNTER — Institutional Professional Consult (permissible substitution): Admitting: Neurology

## 2024-07-04 ENCOUNTER — Ambulatory Visit (HOSPITAL_COMMUNITY): Admitting: Student in an Organized Health Care Education/Training Program

## 2024-08-11 ENCOUNTER — Ambulatory Visit: Admitting: Cardiology

## 2024-08-25 ENCOUNTER — Encounter: Payer: Self-pay | Admitting: Family Medicine

## 2024-10-12 ENCOUNTER — Ambulatory Visit: Admitting: Urology
